# Patient Record
Sex: Female | Born: 1955 | Race: Black or African American | Hispanic: No | Smoking: Former smoker
Health system: Southern US, Community
[De-identification: ages and names within clinical notes are randomized; demographics above are authoritative.]

## PROBLEM LIST (undated history)

## (undated) DIAGNOSIS — F418 Other specified anxiety disorders: Secondary | ICD-10-CM

## (undated) DIAGNOSIS — N189 Chronic kidney disease, unspecified: Secondary | ICD-10-CM

## (undated) DIAGNOSIS — R791 Abnormal coagulation profile: Secondary | ICD-10-CM

## (undated) DIAGNOSIS — Z8719 Personal history of other diseases of the digestive system: Secondary | ICD-10-CM

## (undated) DIAGNOSIS — J189 Pneumonia, unspecified organism: Secondary | ICD-10-CM

## (undated) DIAGNOSIS — E039 Hypothyroidism, unspecified: Secondary | ICD-10-CM

## (undated) DIAGNOSIS — E119 Type 2 diabetes mellitus without complications: Secondary | ICD-10-CM

## (undated) DIAGNOSIS — I10 Essential (primary) hypertension: Secondary | ICD-10-CM

## (undated) DIAGNOSIS — N6001 Solitary cyst of right breast: Secondary | ICD-10-CM

## (undated) DIAGNOSIS — Z22322 Carrier or suspected carrier of Methicillin resistant Staphylococcus aureus: Secondary | ICD-10-CM

## (undated) DIAGNOSIS — I509 Heart failure, unspecified: Secondary | ICD-10-CM

## (undated) DIAGNOSIS — D649 Anemia, unspecified: Secondary | ICD-10-CM

## (undated) DIAGNOSIS — F4310 Post-traumatic stress disorder, unspecified: Secondary | ICD-10-CM

## (undated) DIAGNOSIS — N6002 Solitary cyst of left breast: Secondary | ICD-10-CM

## (undated) DIAGNOSIS — G4730 Sleep apnea, unspecified: Secondary | ICD-10-CM

## (undated) DIAGNOSIS — E785 Hyperlipidemia, unspecified: Secondary | ICD-10-CM

## (undated) DIAGNOSIS — E0590 Thyrotoxicosis, unspecified without thyrotoxic crisis or storm: Secondary | ICD-10-CM

## (undated) DIAGNOSIS — K219 Gastro-esophageal reflux disease without esophagitis: Secondary | ICD-10-CM

## (undated) DIAGNOSIS — H44119 Panuveitis, unspecified eye: Secondary | ICD-10-CM

## (undated) DIAGNOSIS — M1990 Unspecified osteoarthritis, unspecified site: Secondary | ICD-10-CM

## (undated) DIAGNOSIS — I4891 Unspecified atrial fibrillation: Secondary | ICD-10-CM

## (undated) HISTORY — DX: Pneumonia, unspecified organism: J18.9

## (undated) HISTORY — DX: Essential (primary) hypertension: I10

## (undated) HISTORY — DX: Type 2 diabetes mellitus without complications: E11.9

## (undated) HISTORY — DX: Abnormal coagulation profile: R79.1

## (undated) HISTORY — DX: Heart failure, unspecified: I50.9

## (undated) HISTORY — PX: EYE SURGERY: SHX253

## (undated) HISTORY — DX: Unspecified atrial fibrillation: I48.91

## (undated) HISTORY — DX: Solitary cyst of right breast: N60.01

## (undated) HISTORY — PX: KNEE SURGERY: SHX244

## (undated) HISTORY — PX: BREAST BIOPSY: SHX20

## (undated) HISTORY — DX: Chronic kidney disease, unspecified: N18.9

## (undated) HISTORY — PX: TONSILLECTOMY AND ADENOIDECTOMY: SUR1326

## (undated) HISTORY — DX: Hyperlipidemia, unspecified: E78.5

## (undated) HISTORY — DX: Thyrotoxicosis, unspecified without thyrotoxic crisis or storm: E05.90

## (undated) HISTORY — DX: Carrier or suspected carrier of methicillin resistant Staphylococcus aureus: Z22.322

## (undated) HISTORY — DX: Other specified anxiety disorders: F41.8

## (undated) HISTORY — PX: ABDOMINAL HYSTERECTOMY: SHX81

## (undated) HISTORY — PX: HERNIA REPAIR: SHX51

## (undated) HISTORY — DX: Gastro-esophageal reflux disease without esophagitis: K21.9

## (undated) HISTORY — PX: FOOT SURGERY: SHX648

## (undated) HISTORY — DX: Anemia, unspecified: D64.9

## (undated) HISTORY — PX: DENTAL SURGERY: SHX609

## (undated) HISTORY — DX: Post-traumatic stress disorder, unspecified: F43.10

## (undated) HISTORY — DX: Solitary cyst of left breast: N60.02

---

## 2011-12-26 LAB — PROTIME-INR

## 2013-01-20 ENCOUNTER — Ambulatory Visit (INDEPENDENT_AMBULATORY_CARE_PROVIDER_SITE_OTHER): Payer: Self-pay | Admitting: Internal Medicine

## 2013-01-20 ENCOUNTER — Encounter: Payer: Self-pay | Admitting: Internal Medicine

## 2013-01-20 ENCOUNTER — Encounter: Payer: Self-pay | Admitting: *Deleted

## 2013-01-20 VITALS — BP 120/78 | HR 64 | Ht 62.0 in | Wt 195.8 lb

## 2013-01-20 DIAGNOSIS — I10 Essential (primary) hypertension: Secondary | ICD-10-CM

## 2013-01-20 NOTE — Progress Notes (Signed)
HPI Patient is a 57 yo who was followed in [facility] for Afib  Hosp 1 year ago  Found to be in afib Had run out of meds for thyroid. Thyroid was not controlled at time.  Was SOB  Seen 1 yr ago. Had been on sotalol  Ran out a few wks ago.   Taking coumadin. Just regulated.  Notes occasional palpitations. Occasional chest pains with and without activity.  Stabbing  Lasts a couple seconds  At work sluggish, tired.  Out of breath  Going on for awhile.   Never had stress test    Allergies  Allergen Reactions  . Codeine     Breaks out in a rash    Current Outpatient Prescriptions  Medication Sig Dispense Refill  . ALPRAZolam (XANAX) 0.5 MG tablet Take 0.5 mg by mouth 2 (two) times daily.      . ferrous gluconate (FERGON) 325 MG tablet Take 325 mg by mouth 2 (two) times daily.      . furosemide (LASIX) 40 MG tablet Take 40 mg by mouth daily.      Marland Kitchen HYDROcodone-acetaminophen (VICODIN) 5-500 MG per tablet Take 1 tablet by mouth every 6 (six) hours as needed for pain.      Marland Kitchen lisinopril-hydrochlorothiazide (PRINZIDE,ZESTORETIC) 20-12.5 MG per tablet Take 1 tablet by mouth daily.      . meloxicam (MOBIC) 7.5 MG tablet Take 7.5 mg by mouth daily.      . metFORMIN (GLUCOPHAGE) 500 MG tablet Take 500 mg by mouth every evening.      . methimazole (TAPAZOLE) 10 MG tablet Take 20 mg by mouth 3 (three) times daily.      Marland Kitchen omeprazole (PRILOSEC) 20 MG capsule Take 20 mg by mouth daily.      . potassium chloride (KLOR-CON) 20 MEQ packet Take 20 mEq by mouth daily.       . pravastatin (PRAVACHOL) 40 MG tablet Take 40 mg by mouth daily.      . sotalol (BETAPACE) 160 MG tablet Take 160 mg by mouth 2 (two) times daily.       Marland Kitchen warfarin (COUMADIN) 2 MG tablet Take 2 mg by mouth as directed.       No current facility-administered medications for this visit.    Past Medical History  Diagnosis Date  . HTN (hypertension)   . Hyperlipemia   . INR (international normal ratio) abnormal   . Atrial  fibrillation   . DM (diabetes mellitus)   . Hyperthyroidism   . Anemia   . Esophageal reflux   . MRSA (methicillin resistant staph aureus) culture positive   . Pneumonia     Past Surgical History  Procedure Laterality Date  . Abdominal hysterectomy      Family History  Problem Relation Age of Onset  . Alcoholism Father   . Arthritis Father   . Diabetes Mellitus I Mother     sister  . Epilepsy Brother   . Hypertension Father     sister, mother  . Renal Disease Mother   . Stroke Mother     History   Social History  . Marital Status: Legally Separated    Spouse Name: N/A    Number of Children: N/A  . Years of Education: N/A   Occupational History  . Not on file.   Social History Main Topics  . Smoking status: Former Smoker    Quit date: 12/15/2003  . Smokeless tobacco: Current User    Types: Snuff  Comment: 3 times per day  . Alcohol Use: Not on file  . Drug Use: Not on file  . Sexually Active: Not on file   Other Topics Concern  . Not on file   Social History Narrative  . No narrative on file    Review of Systems:  All systems reviewed.  They are negative to the above problem except as previously stated.  Vital Signs: BP 120/78  Pulse 64  Ht 5\' 2"  (1.575 m)  Wt 195 lb 12.8 oz (88.814 kg)  BMI 35.8 kg/m2  Physical Exam Patient is in NAD HEENT:  Normocephalic, atraumatic. EOMI, PERRLA.  Neck: JVP is normal.  No bruits.  Lungs: clear to auscultation. No rales no wheezes.  Heart: Regular rate and rhythm. Normal S1, S2. No S3.   No significant murmurs. PMI not displaced.  Abdomen:  Supple, nontender. Normal bowel sounds. No masses. No hepatomegaly.  Extremities:   Good distal pulses throughout. No lower extremity edema.  Musculoskeletal :moving all extremities.  Neuro:   alert and oriented x3.  CN II-XII grossly intact.  EKG  SB 57 bpm.   Assessment and Plan:  1.  Atrial fibrillation.  Patient currently in SR.  She has been out of sotalol for  a few wks  I would not resume for now.  Will check labs.  Need to get records from [facility] re what testing she has had.  WOuld at the least get echo. Hold for now until review given that she has no insurance Continue coumadin.  2.  Fatigue/dyspnea.  Will review records.  Get thyroid.  WOuld consider echo, possible stress testing depending on outside results.    3.  HTN  Adequate control.   4.  HL  ON statin.

## 2013-01-21 ENCOUNTER — Telehealth: Payer: Self-pay | Admitting: Internal Medicine

## 2013-01-21 NOTE — Telephone Encounter (Signed)
Records rec From [facility], gave to [facility] 01/21/13/KM

## 2013-01-29 ENCOUNTER — Encounter: Payer: Self-pay | Admitting: Internal Medicine

## 2013-01-29 ENCOUNTER — Telehealth: Payer: Self-pay | Admitting: *Deleted

## 2013-01-29 NOTE — Telephone Encounter (Signed)
Per Dr. Tenny Craw:  Received records from [facility]    Patient should have f/u echo. Also recomm CBC, TSH    I would not start sotalol now.    Need to refer to free clinic.         Attempted to call pt on home and mobile number.  No answer and no voicemail.

## 2013-01-30 ENCOUNTER — Other Ambulatory Visit: Payer: Self-pay | Admitting: *Deleted

## 2013-01-30 ENCOUNTER — Encounter: Payer: Self-pay | Admitting: *Deleted

## 2013-01-30 DIAGNOSIS — R5383 Other fatigue: Secondary | ICD-10-CM

## 2013-01-30 DIAGNOSIS — R0602 Shortness of breath: Secondary | ICD-10-CM

## 2013-01-30 DIAGNOSIS — I4891 Unspecified atrial fibrillation: Secondary | ICD-10-CM

## 2013-01-30 NOTE — Telephone Encounter (Signed)
Numerous attempts have been made to contact this patient via her home and cell numbers.  Neither of her phone numbers have voicemail.  Contacted her brother via emergency contact and he said he would have her call us which she has not done.  ECHO, CBC & TSH ordered in [facility].  Message sent to [facility] to contact pt and schedule.

## 2015-03-15 ENCOUNTER — Ambulatory Visit: Payer: Self-pay | Admitting: Nurse Practitioner

## 2015-03-15 ENCOUNTER — Inpatient Hospital Stay (HOSPITAL_COMMUNITY): Admission: RE | Admit: 2015-03-15 | Payer: Self-pay | Source: Ambulatory Visit | Admitting: Nurse Practitioner

## 2015-03-29 ENCOUNTER — Encounter (HOSPITAL_COMMUNITY): Payer: Self-pay | Admitting: Nurse Practitioner

## 2015-03-29 ENCOUNTER — Ambulatory Visit (HOSPITAL_COMMUNITY)
Admission: RE | Admit: 2015-03-29 | Discharge: 2015-03-29 | Disposition: A | Discharge status: Home / Self Care | Payer: Self-pay | Source: Ambulatory Visit | Attending: Nurse Practitioner | Admitting: Nurse Practitioner

## 2015-03-29 VITALS — BP 120/86 | HR 90 | Ht 62.0 in | Wt 198.2 lb

## 2015-03-29 DIAGNOSIS — E785 Hyperlipidemia, unspecified: Secondary | ICD-10-CM | POA: Insufficient documentation

## 2015-03-29 DIAGNOSIS — I480 Paroxysmal atrial fibrillation: Secondary | ICD-10-CM | POA: Insufficient documentation

## 2015-03-29 DIAGNOSIS — Z87891 Personal history of nicotine dependence: Secondary | ICD-10-CM | POA: Insufficient documentation

## 2015-03-29 DIAGNOSIS — I10 Essential (primary) hypertension: Secondary | ICD-10-CM | POA: Insufficient documentation

## 2015-03-29 NOTE — Patient Instructions (Signed)
Follow up in 2 months. Parking code [phone]

## 2015-03-29 NOTE — Progress Notes (Signed)
Patient ID: Jennifer Foley, female   DOB: 1956-09-28, 59 y.o.   MRN: [id number]    Primary Care Physician: No primary care provider on file. Referring Physician:    Rolena Knutson is a 59 y.o. female with a h/o afib on coumadin that was referred to afib clinic. Pt was seen by Dr. Harrington Challenger in 2014, but lost to f/u. Pt recently seen by PCP who was concerned due to pt stopping her coumadin with a chadsvasc score of 4. She describes a low afib  burden with breakthrough afib for several hours every two to three months, with SR returning spontaneously. Does not feel v rates are all that fast when in afib. Was scheduled for sleep apnea test years ago but could not afford. Has just returned to seeing MD's since receiving orange card.  Today, she denies symptoms of palpitations, chest pain, shortness of breath, orthopnea, PND, lower extremity edema, dizziness, presyncope, syncope, or neurologic sequela. The patient is tolerating medications without difficulties and is otherwise without complaint today.   Past Medical History  Diagnosis Date  . HTN (hypertension)   . Hyperlipemia   . INR (international normal ratio) abnormal   . Atrial fibrillation   . DM (diabetes mellitus)   . Hyperthyroidism   . Anemia   . Esophageal reflux   . MRSA (methicillin resistant staph aureus) culture positive   . Pneumonia    Past Surgical History  Procedure Laterality Date  . Abdominal hysterectomy      Current Outpatient Prescriptions  Medication Sig Dispense Refill  . busPIRone (BUSPAR) 10 MG tablet Take 10 mg by mouth 2 (two) times daily.    . ferrous gluconate (FERGON) 325 MG tablet Take 325 mg by mouth 3 (three) times daily with meals.     . furosemide (LASIX) 40 MG tablet Take 40 mg by mouth daily.    Marland Kitchen HYDROcodone-acetaminophen (VICODIN) 5-500 MG per tablet Take 1 tablet by mouth every 6 (six) hours as needed for pain.    . hydrOXYzine (ATARAX/VISTARIL) 50 MG tablet Take 50 mg by mouth at bedtime as needed.     Marland Kitchen lisinopril-hydrochlorothiazide (PRINZIDE,ZESTORETIC) 20-12.5 MG per tablet Take 1 tablet by mouth daily.    . metFORMIN (GLUCOPHAGE) 500 MG tablet Take 500 mg by mouth every evening.    Marland Kitchen omeprazole (PRILOSEC) 20 MG capsule Take 20 mg by mouth daily.    . pravastatin (PRAVACHOL) 40 MG tablet Take 40 mg by mouth daily.    . sertraline (ZOLOFT) 50 MG tablet Take 50 mg by mouth daily.    . traMADol (ULTRAM) 50 MG tablet Take 50 mg by mouth every 6 (six) hours as needed.    . warfarin (COUMADIN) 2 MG tablet Take 2 mg by mouth as directed.     No current facility-administered medications for this encounter.    Allergies  Allergen Reactions  . Codeine     Breaks out in a rash    History   Social History  . Marital Status: Legally Separated    Spouse Name: N/A  . Number of Children: N/A  . Years of Education: N/A   Occupational History  . Not on file.   Social History Main Topics  . Smoking status: Former Smoker    Quit date: 12/15/2003  . Smokeless tobacco: Current User    Types: Snuff     Comment: 3 times per day  . Alcohol Use: Not on file  . Drug Use: Not on file  . Sexual Activity: Not  on file   Other Topics Concern  . Not on file   Social History Narrative    Family History  Problem Relation Age of Onset  . Alcoholism Father   . Arthritis Father   . Diabetes Mellitus I Mother     sister  . Epilepsy Brother   . Hypertension Father     sister, mother  . Renal Disease Mother   . Stroke Mother     ROS- All systems are reviewed and negative except as per the HPI above  Physical Exam: Filed Vitals:   03/29/15 1402  BP: 120/86  Pulse: 90  Height: 5\' 2"  (1.575 m)  Weight: 198 lb 3.2 oz (89.903 kg)    GEN- The patient is well appearing, alert and oriented x 3 today.   Head- normocephalic, atraumatic Eyes-  Sclera clear, conjunctiva pink Ears- hearing intact Oropharynx- clear Neck- supple, no JVP Lymph- no cervical lymphadenopathy Lungs- Clear to  ausculation bilaterally, normal work of breathing Heart- regular rate and rhythm, no murmurs, rubs or gallops, PMI not laterally displaced GI- soft, NT, ND, + BS Extremities- no clubbing, cyanosis, or edema MS- no significant deformity or atrophy Skin- no rash or lesion Psych- euthymic mood, full affect Neuro- strength and sensation are intact  EKG-NSR, 90 bpm, normal EKG  Assessment and Plan:  1. PAF Currently with low afib burden Discussed lifestyle, getting more exercise and weight loss Info re free weight loss class given.  2. Chadsvasc score of at least 4 Discussion re stroke risk and importance of taking blood thinners PCP is trying to get pt into coumadin clinc  3. HTN Stable  4. HLD continue statin  F/u with PCP this week as scheduled for reevaluation of INR back on coumadin Afib clinic in 2 months

## 2015-05-31 ENCOUNTER — Ambulatory Visit (HOSPITAL_COMMUNITY)
Admission: RE | Admit: 2015-05-31 | Discharge: 2015-05-31 | Disposition: A | Discharge status: Home / Self Care | Payer: Medicaid Other | Source: Ambulatory Visit | Attending: Nurse Practitioner | Admitting: Nurse Practitioner

## 2015-05-31 ENCOUNTER — Encounter (HOSPITAL_COMMUNITY): Payer: Self-pay | Admitting: Nurse Practitioner

## 2015-05-31 VITALS — BP 112/70 | HR 85 | Ht 61.0 in | Wt 198.0 lb

## 2015-05-31 DIAGNOSIS — E785 Hyperlipidemia, unspecified: Secondary | ICD-10-CM | POA: Insufficient documentation

## 2015-05-31 DIAGNOSIS — I10 Essential (primary) hypertension: Secondary | ICD-10-CM | POA: Diagnosis not present

## 2015-05-31 DIAGNOSIS — Z87891 Personal history of nicotine dependence: Secondary | ICD-10-CM | POA: Diagnosis not present

## 2015-05-31 DIAGNOSIS — I499 Cardiac arrhythmia, unspecified: Secondary | ICD-10-CM | POA: Diagnosis not present

## 2015-05-31 DIAGNOSIS — I480 Paroxysmal atrial fibrillation: Secondary | ICD-10-CM | POA: Diagnosis present

## 2015-05-31 NOTE — Progress Notes (Signed)
Patient ID: Jennifer Foley, female   DOB: August 03, 1956, 59 y.o.   MRN: [id number]    Primary Care Physician: Dr. Marcello Moores, [place] Referring Physician: Dr. Fredda Hammed Spradlin is a 59 y.o. female with a h/o afib on coumadin that was referred to afib clinic. Pt was seen by Dr. Harrington Challenger in 2014, but lost to f/u. Pt recently seen by PCP who was concerned due to pt stopping her coumadin with a chadsvasc score of 4. She describes a low afib  burden with breakthrough afib for several hours every two to three months, with SR returning spontaneously. Does not feel v rates are all that fast when in afib. Was scheduled for sleep apnea test years ago but could not afford. Has just returned to seeing MD's since receiving orange card.  On this visit to the afib clinic, she continues on warfarin, being followed by PCP and says she is in range. She is now saying that whenever she tries to do housework, go up steps, her heart rate goes crazy. Settles back down when she rests. Records reveal she was on sotalol at one time. But would like to confirm afib and afib burden before using an antiarrythmic. Currently she is not on any rate control and she may be benefit from this as a first option. She is willing to wear a holter monitor.  Today, she denies symptoms of  chest pain, shortness of breath, orthopnea, PND, lower extremity edema, dizziness, presyncope, syncope, or neurologic sequela. Positive for occasional palpitations.The patient is tolerating medications without difficulties and is otherwise without complaint today.   Past Medical History  Diagnosis Date  . HTN (hypertension)   . Hyperlipemia   . INR (international normal ratio) abnormal   . Atrial fibrillation   . DM (diabetes mellitus)   . Hyperthyroidism   . Anemia   . Esophageal reflux   . MRSA (methicillin resistant staph aureus) culture positive   . Pneumonia    Past Surgical History  Procedure Laterality Date  . Abdominal hysterectomy        Current Outpatient Prescriptions  Medication Sig Dispense Refill  . busPIRone (BUSPAR) 10 MG tablet Take 10 mg by mouth 2 (two) times daily.    . ferrous gluconate (FERGON) 325 MG tablet Take 325 mg by mouth daily.     . furosemide (LASIX) 40 MG tablet Take 40 mg by mouth daily.    Marland Kitchen HYDROcodone-acetaminophen (VICODIN) 5-500 MG per tablet Take 1 tablet by mouth every 6 (six) hours as needed for pain.    . hydrOXYzine (ATARAX/VISTARIL) 50 MG tablet Take 50 mg by mouth at bedtime as needed.    Marland Kitchen lisinopril-hydrochlorothiazide (PRINZIDE,ZESTORETIC) 20-12.5 MG per tablet Take 1 tablet by mouth daily.    . metFORMIN (GLUCOPHAGE) 500 MG tablet Take 500 mg by mouth every evening.    Marland Kitchen omeprazole (PRILOSEC) 20 MG capsule Take 20 mg by mouth daily.    . pravastatin (PRAVACHOL) 40 MG tablet Take 40 mg by mouth daily.    . sertraline (ZOLOFT) 50 MG tablet Take 50 mg by mouth daily.    . traMADol (ULTRAM) 50 MG tablet Take 50 mg by mouth every 6 (six) hours as needed.    . warfarin (COUMADIN) 2 MG tablet Take 2 mg by mouth as directed.    . hydrochlorothiazide (HYDRODIURIL) 25 MG tablet Take 25 mg by mouth daily.    . naproxen (NAPROSYN) 250 MG tablet Take 550 mg by mouth as needed.  No current facility-administered medications for this encounter.    Allergies  Allergen Reactions  . Codeine     Breaks out in a rash    History   Social History  . Marital Status: Legally Separated    Spouse Name: N/A  . Number of Children: N/A  . Years of Education: N/A   Occupational History  . Not on file.   Social History Main Topics  . Smoking status: Former Smoker    Quit date: 12/15/2003  . Smokeless tobacco: Current User    Types: Snuff     Comment: 3 times per day  . Alcohol Use: Not on file  . Drug Use: Not on file  . Sexual Activity: Not on file   Other Topics Concern  . Not on file   Social History Narrative    Family History  Problem Relation Age of Onset  . Alcoholism  Father   . Arthritis Father   . Diabetes Mellitus I Mother     sister  . Epilepsy Brother   . Hypertension Father     sister, mother  . Renal Disease Mother   . Stroke Mother     ROS- All systems are reviewed and negative except as per the HPI above  Physical Exam: Filed Vitals:   05/31/15 1443  BP: 112/70  Pulse: 85  Height: 5\' 1"  (1.549 m)  Weight: 198 lb (89.812 kg)    GEN- The patient is well appearing, alert and oriented x 3 today.   Head- normocephalic, atraumatic Eyes-  Sclera clear, conjunctiva pink Ears- hearing intact Oropharynx- clear Neck- supple, no JVP Lymph- no cervical lymphadenopathy Lungs- Clear to ausculation bilaterally, normal work of breathing Heart- regular rate and rhythm, no murmurs, rubs or gallops, PMI not laterally displaced GI- soft, NT, ND, + BS Extremities- no clubbing, cyanosis, or edema MS- no significant deformity or atrophy Skin- no rash or lesion Psych- euthymic mood, full affect Neuro- strength and sensation are intact  EKG-NSR, 85 bpm, Pr int 152 ms, QRS 86 ms, QTc  464ms, Occasional PVC  Assessment and Plan:  1. H/o afib NSR by EKG Describes irregular heart beat with exertion 48 hour monitor to further understand irregular heart beat, PC's vrs afib May need rate control vrs AAD Discussed lifestyle, getting more exercise and weight loss  2. Chadsvasc score of at least 4 Discussion re stroke risk and importance of taking blood thinners Taking coumadin, in range per pt and pcp is following  3. HTN Stable  4. HLD continue statin  afib clinic in 4 weeks

## 2015-05-31 NOTE — Patient Instructions (Signed)
Parking code for September [id number] 

## 2015-06-01 ENCOUNTER — Ambulatory Visit (INDEPENDENT_AMBULATORY_CARE_PROVIDER_SITE_OTHER): Payer: No Typology Code available for payment source

## 2015-06-01 DIAGNOSIS — I499 Cardiac arrhythmia, unspecified: Secondary | ICD-10-CM

## 2015-07-01 ENCOUNTER — Inpatient Hospital Stay (HOSPITAL_COMMUNITY): Admission: RE | Admit: 2015-07-01 | Payer: Self-pay | Source: Ambulatory Visit | Admitting: Nurse Practitioner

## 2015-07-07 ENCOUNTER — Encounter (HOSPITAL_COMMUNITY): Payer: Self-pay | Admitting: Nurse Practitioner

## 2015-07-07 ENCOUNTER — Ambulatory Visit (HOSPITAL_COMMUNITY)
Admission: RE | Admit: 2015-07-07 | Discharge: 2015-07-07 | Disposition: A | Discharge status: Home / Self Care | Payer: Medicaid Other | Source: Ambulatory Visit | Attending: Nurse Practitioner | Admitting: Nurse Practitioner

## 2015-07-07 VITALS — BP 104/70 | HR 90 | Ht 62.0 in | Wt 193.2 lb

## 2015-07-07 DIAGNOSIS — I10 Essential (primary) hypertension: Secondary | ICD-10-CM | POA: Insufficient documentation

## 2015-07-07 DIAGNOSIS — E119 Type 2 diabetes mellitus without complications: Secondary | ICD-10-CM | POA: Diagnosis not present

## 2015-07-07 DIAGNOSIS — K219 Gastro-esophageal reflux disease without esophagitis: Secondary | ICD-10-CM | POA: Insufficient documentation

## 2015-07-07 DIAGNOSIS — I4891 Unspecified atrial fibrillation: Secondary | ICD-10-CM | POA: Insufficient documentation

## 2015-07-07 DIAGNOSIS — Z87891 Personal history of nicotine dependence: Secondary | ICD-10-CM | POA: Insufficient documentation

## 2015-07-07 DIAGNOSIS — E785 Hyperlipidemia, unspecified: Secondary | ICD-10-CM | POA: Insufficient documentation

## 2015-07-07 DIAGNOSIS — Z833 Family history of diabetes mellitus: Secondary | ICD-10-CM | POA: Diagnosis not present

## 2015-07-07 DIAGNOSIS — Z7901 Long term (current) use of anticoagulants: Secondary | ICD-10-CM | POA: Diagnosis not present

## 2015-07-07 DIAGNOSIS — Z79899 Other long term (current) drug therapy: Secondary | ICD-10-CM | POA: Diagnosis not present

## 2015-07-07 DIAGNOSIS — R002 Palpitations: Secondary | ICD-10-CM

## 2015-07-07 DIAGNOSIS — Z8249 Family history of ischemic heart disease and other diseases of the circulatory system: Secondary | ICD-10-CM | POA: Insufficient documentation

## 2015-07-07 NOTE — Progress Notes (Signed)
Patient ID: Jennifer Foley, female   DOB: 11/04/1955, 59 y.o.   MRN: [id number]    Primary Care Physician: Dr. Marcello Moores, [place] Referring Physician: Dr. Fredda Hammed Jennifer Foley is a 59 y.o. female with a h/o afib on coumadin that was referred to afib clinic. Pt was seen by Dr. Harrington Challenger in 2014, but lost to f/u. Pt recently seen by PCP who was concerned due to pt stopping her coumadin with a chadsvasc score of 4. She describes a low afib  burden with breakthrough afib for several hours every two to three months, with SR returning spontaneously. Does not feel v rates are all that fast when in afib. Was scheduled for sleep apnea test years ago but could not afford. Has just returned to seeing MD's since receiving orange card.  On this visit to the afib clinic, she continues on warfarin, being followed by PCP and says she is in range. She is now saying that whenever she tries to do housework, go up steps, her heart rate increases.. Settles back down when she rests. Records reveal she was on sotalol at one time.She wore a holter monitor which did not show any afib.She reports no episodes that sound like PAF. Some increased heart rate with activity, possibly secondary to deconditioning.  Today, she denies symptoms of  chest pain, shortness of breath, orthopnea, PND, lower extremity edema, dizziness, presyncope, syncope, or neurologic sequela. Positive for occasional palpitations.The patient is tolerating medications without difficulties and is otherwise without complaint today.   Past Medical History  Diagnosis Date  . HTN (hypertension)   . Hyperlipemia   . INR (international normal ratio) abnormal   . Atrial fibrillation   . DM (diabetes mellitus)   . Hyperthyroidism   . Anemia   . Esophageal reflux   . MRSA (methicillin resistant staph aureus) culture positive   . Pneumonia    Past Surgical History  Procedure Laterality Date  . Abdominal hysterectomy      Current Outpatient  Prescriptions  Medication Sig Dispense Refill  . busPIRone (BUSPAR) 10 MG tablet Take 10 mg by mouth 2 (two) times daily.    . ferrous gluconate (FERGON) 325 MG tablet Take 325 mg by mouth daily.     . furosemide (LASIX) 40 MG tablet Take 40 mg by mouth daily.    Marland Kitchen HYDROcodone-acetaminophen (VICODIN) 5-500 MG per tablet Take 1 tablet by mouth every 6 (six) hours as needed for pain.    . hydrOXYzine (ATARAX/VISTARIL) 50 MG tablet Take 50 mg by mouth at bedtime as needed.    Marland Kitchen lisinopril-hydrochlorothiazide (PRINZIDE,ZESTORETIC) 20-12.5 MG per tablet Take 1 tablet by mouth daily.    . metFORMIN (GLUCOPHAGE) 500 MG tablet Take 500 mg by mouth every evening.    . naproxen (NAPROSYN) 250 MG tablet Take 550 mg by mouth as needed.    Marland Kitchen omeprazole (PRILOSEC) 20 MG capsule Take 20 mg by mouth daily.    . sertraline (ZOLOFT) 50 MG tablet Take 50 mg by mouth daily.    . traMADol (ULTRAM) 50 MG tablet Take 50 mg by mouth every 6 (six) hours as needed.    . warfarin (COUMADIN) 2 MG tablet Take 2 mg by mouth as directed.    . hydrochlorothiazide (HYDRODIURIL) 25 MG tablet Take 25 mg by mouth daily.    . pravastatin (PRAVACHOL) 40 MG tablet Take 40 mg by mouth daily.     No current facility-administered medications for this encounter.    Allergies  Allergen Reactions  .  Codeine     Breaks out in a rash    Social History   Social History  . Marital Status: Legally Separated    Spouse Name: N/A  . Number of Children: N/A  . Years of Education: N/A   Occupational History  . Not on file.   Social History Main Topics  . Smoking status: Former Smoker    Quit date: 12/15/2003  . Smokeless tobacco: Current User    Types: Snuff     Comment: 3 times per day  . Alcohol Use: Not on file  . Drug Use: Not on file  . Sexual Activity: Not on file   Other Topics Concern  . Not on file   Social History Narrative    Family History  Problem Relation Age of Onset  . Alcoholism Father   .  Arthritis Father   . Diabetes Mellitus I Mother     sister  . Epilepsy Brother   . Hypertension Father     sister, mother  . Renal Disease Mother   . Stroke Mother     ROS- All systems are reviewed and negative except as per the HPI above  Physical Exam: Filed Vitals:   07/07/15 1553  BP: 104/70  Pulse: 90  Height: 5\' 2"  (1.575 m)  Weight: 193 lb 3.2 oz (87.635 kg)    GEN- The patient is well appearing, alert and oriented x 3 today.   Head- normocephalic, atraumatic Eyes-  Sclera clear, conjunctiva pink Ears- hearing intact Oropharynx- clear Neck- supple, no JVP Lymph- no cervical lymphadenopathy Lungs- Clear to ausculation bilaterally, normal work of breathing Heart- regular rate and rhythm, no murmurs, rubs or gallops, PMI not laterally displaced GI- soft, NT, ND, + BS Extremities- no clubbing, cyanosis, or edema MS- no significant deformity or atrophy Skin- no rash or lesion Psych- euthymic mood, full affect Neuro- strength and sensation are intact  EKG-NSR, 85 bpm, Pr int 152 ms, QRS 86 ms, QTc  458ms, Occasional PVC Holter monitor- 06/01/15 Sinus rhythm Rates 65 to 129 bpm Average HR 88 bpm Occasional PVC Rare PAC   Assessment and Plan:  1. H/o afib NSR by recent  48 hour moniotr No complaints of recent irregular heart beat. Discussed lifestyle, getting more exercise and weight loss  2. Chadsvasc score of at least 4 Discussion re stroke risk and importance of taking blood thinners Taking coumadin, in range per pt and pcp is following  3. HTN Stable  4. HLD continue statin  afib clinic in 3 months  Butch Penny C. Verlie Liotta, [facility] [address] [place],  [address] [phone]

## 2015-10-06 ENCOUNTER — Encounter (HOSPITAL_COMMUNITY): Payer: Self-pay | Admitting: Nurse Practitioner

## 2015-10-06 ENCOUNTER — Ambulatory Visit (HOSPITAL_COMMUNITY)
Admission: RE | Admit: 2015-10-06 | Discharge: 2015-10-06 | Disposition: A | Discharge status: Home / Self Care | Payer: Medicaid Other | Source: Ambulatory Visit | Attending: Nurse Practitioner | Admitting: Nurse Practitioner

## 2015-10-06 VITALS — BP 94/64 | HR 97 | Ht 61.0 in | Wt 201.6 lb

## 2015-10-06 DIAGNOSIS — E785 Hyperlipidemia, unspecified: Secondary | ICD-10-CM | POA: Diagnosis not present

## 2015-10-06 DIAGNOSIS — I10 Essential (primary) hypertension: Secondary | ICD-10-CM | POA: Insufficient documentation

## 2015-10-06 DIAGNOSIS — I480 Paroxysmal atrial fibrillation: Secondary | ICD-10-CM | POA: Diagnosis not present

## 2015-10-06 NOTE — Progress Notes (Signed)
Patient ID: Jennifer Foley, female   DOB: [birth year], 59 y.o.   MRN: [id number]    Primary Care Physician: Dr. Marcello Moores, [place] Referring Physician: Dr. [name] is a 59 y.o. female with a h/o afib on coumadin that was referred to afib clinic. Pt was seen by Dr. Harrington Challenger in 2014, but lost to f/u. Pt recently seen by PCP who was concerned due to pt stopping her coumadin with a chadsvasc score of 4. She describes a low afib  burden with breakthrough afib for several hours every two to three months, with SR returning spontaneously. Does not feel v rates are all that fast when in afib. Was scheduled for sleep apnea test years ago but could not afford. Has just returned to seeing MD's since receiving orange card.  On this visit to the afib clinic, she continues on warfarin, being followed by PCP and says she is in range. She is now saying that whenever she tries to do housework, go up steps, her heart rate increases.. Settles back down when she rests. Records reveal she was on sotalol at one time.She wore a holter monitor which did not show any afib.She reports no episodes that sound like PAF.   Today, she denies symptoms of  chest pain, shortness of breath, orthopnea, PND, lower extremity edema, dizziness, presyncope, syncope, or neurologic sequela. Positive for occasional palpitations.The patient is tolerating medications without difficulties and is otherwise without complaint today.   Past Medical History  Diagnosis Date  . HTN (hypertension)   . Hyperlipemia   . INR (international normal ratio) abnormal   . Atrial fibrillation ([place])   . DM (diabetes mellitus) ([place])   . Hyperthyroidism   . Anemia   . Esophageal reflux   . MRSA (methicillin resistant staph aureus) culture positive   . Pneumonia    Past Surgical History  Procedure Laterality Date  . Abdominal hysterectomy      Current Outpatient Prescriptions  Medication Sig Dispense Refill  . busPIRone (BUSPAR) 10 MG  tablet Take 10 mg by mouth 2 (two) times daily.    . ferrous gluconate (FERGON) 325 MG tablet Take 325 mg by mouth daily.     . furosemide (LASIX) 40 MG tablet Take 40 mg by mouth daily.    . hydrochlorothiazide (HYDRODIURIL) 25 MG tablet Take 25 mg by mouth daily.    Marland Kitchen HYDROcodone-acetaminophen (VICODIN) 5-500 MG per tablet Take 1 tablet by mouth every 6 (six) hours as needed for pain.    . hydrOXYzine (ATARAX/VISTARIL) 50 MG tablet Take 50 mg by mouth at bedtime as needed.    Marland Kitchen lisinopril-hydrochlorothiazide (PRINZIDE,ZESTORETIC) 20-12.5 MG per tablet Take 1 tablet by mouth daily.    . metFORMIN (GLUCOPHAGE) 500 MG tablet Take 500 mg by mouth every evening.    . naproxen (NAPROSYN) 250 MG tablet Take 550 mg by mouth as needed.    Marland Kitchen omeprazole (PRILOSEC) 20 MG capsule Take 20 mg by mouth daily.    . pravastatin (PRAVACHOL) 40 MG tablet Take 40 mg by mouth daily.    . sertraline (ZOLOFT) 50 MG tablet Take 50 mg by mouth daily.    . traMADol (ULTRAM) 50 MG tablet Take 50 mg by mouth every 6 (six) hours as needed.    . warfarin (COUMADIN) 2 MG tablet Take 5 mg by mouth as directed.      No current facility-administered medications for this encounter.    Allergies  Allergen Reactions  . Codeine  Breaks out in a rash    Social History   Social History  . Marital Status: Legally Separated    Spouse Name: N/A  . Number of Children: N/A  . Years of Education: N/A   Occupational History  . Not on file.   Social History Main Topics  . Smoking status: Former Smoker    Quit date: 12/15/2003  . Smokeless tobacco: Current User    Types: Snuff     Comment: 3 times per day  . Alcohol Use: Not on file  . Drug Use: Not on file  . Sexual Activity: Not on file   Other Topics Concern  . Not on file   Social History Narrative    Family History  Problem Relation Age of Onset  . Alcoholism Father   . Arthritis Father   . Diabetes Mellitus I Mother     sister  . Epilepsy Brother     . Hypertension Father     sister, mother  . Renal Disease Mother   . Stroke Mother     ROS- All systems are reviewed and negative except as per the HPI above  Physical Exam: Filed Vitals:   10/06/15 1544  BP: 94/64  Pulse: 97  Height: 5\' 1"  (1.549 m)  Weight: 201 lb 9.6 oz (91.445 kg)    GEN- The patient is well appearing, alert and oriented x 3 today.   Head- normocephalic, atraumatic Eyes-  Sclera clear, conjunctiva pink Ears- hearing intact Oropharynx- clear Neck- supple, no JVP Lymph- no cervical lymphadenopathy Lungs- Clear to ausculation bilaterally, normal work of breathing Heart- regular rate and rhythm, no murmurs, rubs or gallops, PMI not laterally displaced GI- soft, NT, ND, + BS Extremities- no clubbing, cyanosis, or edema MS- no significant deformity or atrophy Skin- no rash or lesion Psych- euthymic mood, full affect Neuro- strength and sensation are intact  EKG-NSR, 97 bpm, Pr int 154 ms, QRS 84 ms, QTc  433ms,  Holter monitor- 06/01/15 Sinus rhythm Rates 65 to 129 bpm Average HR 88 bpm Occasional PVC Rare PAC   Assessment and Plan:  1. H/o afib NSR by recent  48 hour monitor No complaints of  irregular heart beat. Discussed lifestyle, getting more exercise and weight loss  2. Chadsvasc score of at least 4 Discussion re stroke risk and importance of taking blood thinners Taking coumadin, in range per pt and pcp is following  3. HTN BP low today, asymptomatic Encouraged to drink a couple glasses of extra water next couple of days Usually drinks just soda's Goes to coumadin clinic next week, she is ask to have her BP checked and if low, may need antihypertensive's decreased.   4. HLD Continue pravastain  F/u afib clinc as needed  [name], [facility] [address] [place], [place] [id number] [phone]

## 2015-12-23 ENCOUNTER — Ambulatory Visit: Payer: Medicare Other | Attending: Primary Care | Admitting: Physical Therapy

## 2015-12-23 ENCOUNTER — Emergency Department (HOSPITAL_COMMUNITY)
Admission: EM | Admit: 2015-12-23 | Discharge: 2015-12-24 | Disposition: A | Discharge status: Home / Self Care | Payer: Medicare Other | Attending: Emergency Medicine | Admitting: Emergency Medicine

## 2015-12-23 ENCOUNTER — Encounter: Payer: Self-pay | Admitting: Physical Therapy

## 2015-12-23 DIAGNOSIS — M545 Low back pain: Secondary | ICD-10-CM | POA: Diagnosis not present

## 2015-12-23 DIAGNOSIS — M25562 Pain in left knee: Secondary | ICD-10-CM | POA: Diagnosis present

## 2015-12-23 DIAGNOSIS — Z7984 Long term (current) use of oral hypoglycemic drugs: Secondary | ICD-10-CM | POA: Diagnosis not present

## 2015-12-23 DIAGNOSIS — Z8614 Personal history of Methicillin resistant Staphylococcus aureus infection: Secondary | ICD-10-CM | POA: Diagnosis not present

## 2015-12-23 DIAGNOSIS — E785 Hyperlipidemia, unspecified: Secondary | ICD-10-CM | POA: Insufficient documentation

## 2015-12-23 DIAGNOSIS — D649 Anemia, unspecified: Secondary | ICD-10-CM | POA: Diagnosis not present

## 2015-12-23 DIAGNOSIS — R109 Unspecified abdominal pain: Secondary | ICD-10-CM | POA: Diagnosis not present

## 2015-12-23 DIAGNOSIS — R269 Unspecified abnormalities of gait and mobility: Secondary | ICD-10-CM | POA: Diagnosis present

## 2015-12-23 DIAGNOSIS — R10A0 Flank pain, unspecified side: Secondary | ICD-10-CM

## 2015-12-23 DIAGNOSIS — R51 Headache: Secondary | ICD-10-CM | POA: Insufficient documentation

## 2015-12-23 DIAGNOSIS — E119 Type 2 diabetes mellitus without complications: Secondary | ICD-10-CM | POA: Diagnosis not present

## 2015-12-23 DIAGNOSIS — N289 Disorder of kidney and ureter, unspecified: Secondary | ICD-10-CM

## 2015-12-23 DIAGNOSIS — I4891 Unspecified atrial fibrillation: Secondary | ICD-10-CM | POA: Insufficient documentation

## 2015-12-23 DIAGNOSIS — R079 Chest pain, unspecified: Secondary | ICD-10-CM | POA: Diagnosis not present

## 2015-12-23 DIAGNOSIS — Z7901 Long term (current) use of anticoagulants: Secondary | ICD-10-CM | POA: Insufficient documentation

## 2015-12-23 DIAGNOSIS — M25551 Pain in right hip: Secondary | ICD-10-CM | POA: Insufficient documentation

## 2015-12-23 DIAGNOSIS — Z90710 Acquired absence of both cervix and uterus: Secondary | ICD-10-CM | POA: Diagnosis not present

## 2015-12-23 DIAGNOSIS — Z8701 Personal history of pneumonia (recurrent): Secondary | ICD-10-CM | POA: Insufficient documentation

## 2015-12-23 DIAGNOSIS — Z87891 Personal history of nicotine dependence: Secondary | ICD-10-CM | POA: Insufficient documentation

## 2015-12-23 DIAGNOSIS — E041 Nontoxic single thyroid nodule: Secondary | ICD-10-CM | POA: Diagnosis not present

## 2015-12-23 DIAGNOSIS — R61 Generalized hyperhidrosis: Secondary | ICD-10-CM | POA: Insufficient documentation

## 2015-12-23 DIAGNOSIS — I10 Essential (primary) hypertension: Secondary | ICD-10-CM | POA: Diagnosis not present

## 2015-12-23 DIAGNOSIS — Z79899 Other long term (current) drug therapy: Secondary | ICD-10-CM | POA: Diagnosis not present

## 2015-12-23 DIAGNOSIS — K219 Gastro-esophageal reflux disease without esophagitis: Secondary | ICD-10-CM | POA: Diagnosis not present

## 2015-12-23 NOTE — Patient Instructions (Addendum)
Gait training with single point cane - instructed in correct sequence; also instructed in correct sequence with use of cane with step negotiation  Informed pt of least inexpensive  Single point canes to be purchased ([employer]) with small rubber quad tip ($8) - pt verbalized understanding

## 2015-12-24 ENCOUNTER — Emergency Department (HOSPITAL_COMMUNITY): Payer: Medicare Other

## 2015-12-24 ENCOUNTER — Encounter (HOSPITAL_COMMUNITY): Payer: Self-pay | Admitting: Emergency Medicine

## 2015-12-24 DIAGNOSIS — R079 Chest pain, unspecified: Secondary | ICD-10-CM | POA: Diagnosis not present

## 2015-12-24 LAB — HEPATIC FUNCTION PANEL
ALT: 9 U/L — ABNORMAL LOW (ref 14–54)
AST: 15 U/L (ref 15–41)
Albumin: 3.6 g/dL (ref 3.5–5.0)
Alkaline Phosphatase: 78 U/L (ref 38–126)
TOTAL PROTEIN: 7.3 g/dL (ref 6.5–8.1)
Total Bilirubin: 0.3 mg/dL (ref 0.3–1.2)

## 2015-12-24 LAB — BASIC METABOLIC PANEL
ANION GAP: 16 — AB (ref 5–15)
BUN: 29 mg/dL — ABNORMAL HIGH (ref 6–20)
CALCIUM: 9.7 mg/dL (ref 8.9–10.3)
CHLORIDE: 97 mmol/L — AB (ref 101–111)
CO2: 27 mmol/L (ref 22–32)
Creatinine, Ser: 1.74 mg/dL — ABNORMAL HIGH (ref 0.44–1.00)
GFR calc non Af Amer: 31 mL/min — ABNORMAL LOW (ref >60)
GFR, EST AFRICAN AMERICAN: 36 mL/min — AB (ref >60)
Glucose, Bld: 144 mg/dL — ABNORMAL HIGH (ref 65–99)
Potassium: 3.4 mmol/L — ABNORMAL LOW (ref 3.5–5.1)
Sodium: 140 mmol/L (ref 135–145)

## 2015-12-24 LAB — CBC WITH DIFFERENTIAL/PLATELET
BASOS PCT: 1 %
Basophils Absolute: 0.1 10*3/uL (ref 0.0–0.1)
Eosinophils Absolute: 0.1 10*3/uL (ref 0.0–0.7)
Eosinophils Relative: 2 %
HCT: 35.6 % — ABNORMAL LOW (ref 36.0–46.0)
HEMOGLOBIN: 11.5 g/dL — AB (ref 12.0–15.0)
Lymphocytes Relative: 20 %
Lymphs Abs: 1.8 10*3/uL (ref 0.7–4.0)
MCH: 28.5 pg (ref 26.0–34.0)
MCHC: 32.3 g/dL (ref 30.0–36.0)
MCV: 88.1 fL (ref 78.0–100.0)
Monocytes Absolute: 0.4 10*3/uL (ref 0.1–1.0)
Monocytes Relative: 4 %
NEUTROS ABS: 6.9 10*3/uL (ref 1.7–7.7)
NEUTROS PCT: 73 %
PLATELETS: 232 10*3/uL (ref 150–400)
RBC: 4.04 MIL/uL (ref 3.87–5.11)
RDW: 13.3 % (ref 11.5–15.5)
WBC: 9.3 10*3/uL (ref 4.0–10.5)

## 2015-12-24 LAB — URINALYSIS, ROUTINE W REFLEX MICROSCOPIC
BILIRUBIN URINE: NEGATIVE
Glucose, UA: NEGATIVE mg/dL
Hgb urine dipstick: NEGATIVE
KETONES UR: NEGATIVE mg/dL
LEUKOCYTES UA: NEGATIVE
NITRITE: NEGATIVE
PH: 5.5 (ref 5.0–8.0)
PROTEIN: NEGATIVE mg/dL
Specific Gravity, Urine: 1.016 (ref 1.005–1.030)

## 2015-12-24 LAB — I-STAT TROPONIN, ED: TROPONIN I, POC: 0 ng/mL (ref 0.00–0.08)

## 2015-12-24 LAB — CBG MONITORING, ED: Glucose-Capillary: 129 mg/dL — ABNORMAL HIGH (ref 65–99)

## 2015-12-24 LAB — LIPASE, BLOOD: LIPASE: 24 U/L (ref 11–51)

## 2015-12-24 MED ORDER — OXYCODONE-ACETAMINOPHEN 5-325 MG PO TABS
1.0000 | ORAL_TABLET | Freq: Once | ORAL | Status: AC
  Administered 2015-12-24: 1 via ORAL
  Filled 2015-12-24: qty 1

## 2015-12-24 MED ORDER — SODIUM CHLORIDE 0.9 % IV BOLUS (SEPSIS)
1000.0000 mL | Freq: Once | INTRAVENOUS | Status: AC
  Administered 2015-12-24: 1000 mL via INTRAVENOUS

## 2015-12-24 MED ORDER — FENTANYL CITRATE (PF) 100 MCG/2ML IJ SOLN
100.0000 ug | Freq: Once | INTRAMUSCULAR | Status: AC
  Administered 2015-12-24: 100 ug via INTRAVENOUS
  Filled 2015-12-24: qty 2

## 2015-12-24 NOTE — ED Notes (Signed)
Pt reports that she went out to eat and about 30 minutes later she began having left sided chest pain that radiated to her back b/t her shoulder blades.  She reports that she did take her reflux medications.  First responders reported she was diaphoric upon arrival but that has since stopped.  She reports several recent medication changes.

## 2015-12-24 NOTE — ED Provider Notes (Signed)
CSN: [id number]     Arrival date & time 12/23/15  2353 History   By signing my name below, I, Jennifer Foley, attest that this documentation has been prepared under the direction and in the presence of [name], MD.   Electronically Signed: Nicole Foley, ED Scribe. 12/24/2015. 1:58 AM     Chief Complaint  Patient presents with  . Chest Pain    Patient is a 60 y.o. female presenting with abdominal pain. The history is provided by the patient. No language interpreter was used.  Abdominal Pain Pain location:  L flank Pain radiates to:  Back, chest and R leg Pain severity:  Mild Onset quality:  Sudden Duration:  3 hours Timing:  Constant Progression:  Unable to specify Chronicity:  New Relieved by:  Nothing Worsened by:  Nothing tried Ineffective treatments:  None tried Associated symptoms: chest pain   Associated symptoms: no dysuria, no fever and no vomiting    HPI Comments: Jennifer Foley is a 60 y.o. female who presents to the Emergency Department complaining of sudden onset, constant, left-sided abdominal pain, onset earlier tonight approximately three hours ago. Pt reports associated diaphoresis, upper and lower back pain, right hip pain, mild chest pain, and headache. She says that the pain began in her abdomen before alleviating and traveling to the other areas noted. No worsening or alleviating factors noted. Pt denies fever, vomiting, dysuria, difficulty urinating, weakness in extremities, or any other pertinent symptoms.    Past Medical History  Diagnosis Date  . HTN (hypertension)   . Hyperlipemia   . INR (international normal ratio) abnormal   . Atrial fibrillation ([place])   . DM (diabetes mellitus) ([place])   . Hyperthyroidism   . Anemia   . Esophageal reflux   . MRSA (methicillin resistant staph aureus) culture positive   . Pneumonia    Past Surgical History  Procedure Laterality Date  . Abdominal hysterectomy    . Hernia repair     Family History   Problem Relation Age of Onset  . Alcoholism Father   . Arthritis Father   . Diabetes Mellitus I Mother     sister  . Epilepsy Brother   . Hypertension Father     sister, mother  . Renal Disease Mother   . Stroke Mother    Social History  Substance Use Topics  . Smoking status: Former Smoker    Quit date: 12/15/2003  . Smokeless tobacco: Current User    Types: Snuff     Comment: 3 times per day  . Alcohol Use: None   OB History    No data available     Review of Systems  Constitutional: Negative for fever.  Cardiovascular: Positive for chest pain.       Mild, non-radiating chest pain.   Gastrointestinal: Positive for abdominal pain. Negative for vomiting.  Genitourinary: Negative for dysuria and difficulty urinating.  Musculoskeletal: Positive for back pain and arthralgias.       Right hip pain.  Neurological: Positive for headaches. Negative for weakness.  All other systems reviewed and are negative.   Allergies  Codeine  Home Medications   Prior to Admission medications   Medication Sig Start Date End Date Taking? Authorizing Provider  busPIRone (BUSPAR) 10 MG tablet Take 10 mg by mouth 2 (two) times daily.    Historical Provider, MD  ferrous gluconate (FERGON) 325 MG tablet Take 325 mg by mouth daily.     Historical Provider, MD  furosemide (LASIX) 40 MG  tablet Take 40 mg by mouth daily.    Historical Provider, MD  hydrochlorothiazide (HYDRODIURIL) 25 MG tablet Take 25 mg by mouth daily.    Historical Provider, MD  HYDROcodone-acetaminophen (VICODIN) 5-500 MG per tablet Take 1 tablet by mouth every 6 (six) hours as needed for pain.    Historical Provider, MD  hydrOXYzine (ATARAX/VISTARIL) 50 MG tablet Take 50 mg by mouth at bedtime as needed.    Historical Provider, MD  lisinopril-hydrochlorothiazide (PRINZIDE,ZESTORETIC) 20-12.5 MG per tablet Take 1 tablet by mouth daily.    Historical Provider, MD  metFORMIN (GLUCOPHAGE) 500 MG tablet Take 500 mg by mouth every  evening.    Historical Provider, MD  naproxen (NAPROSYN) 250 MG tablet Take 550 mg by mouth as needed.    Historical Provider, MD  omeprazole (PRILOSEC) 20 MG capsule Take 20 mg by mouth daily.    Historical Provider, MD  pravastatin (PRAVACHOL) 40 MG tablet Take 40 mg by mouth daily.    Historical Provider, MD  Rivaroxaban (XARELTO) 15 MG TABS tablet Take 15 mg by mouth 1 day or 1 dose.    Historical Provider, MD  sertraline (ZOLOFT) 50 MG tablet Take 50 mg by mouth daily.    Historical Provider, MD  traMADol (ULTRAM) 50 MG tablet Take 50 mg by mouth every 6 (six) hours as needed.    Historical Provider, MD  warfarin (COUMADIN) 2 MG tablet Take 5 mg by mouth as directed.     Historical Provider, MD   BP 119/72 mmHg  Pulse 78  Temp(Src) 97.8 F (36.6 C) (Oral)  Resp 19  SpO2 100% Physical Exam CONSTITUTIONAL: Well developed/well nourished HEAD: Normocephalic/atraumatic EYES: EOMI/PERRL ENMT: Mucous membranes moist NECK: supple no meningeal signs SPINE/BACK:entire spine nontender CV: S1/S2 noted, no murmurs/rubs/gallops noted LUNGS: Lungs are clear to auscultation bilaterally, no apparent distress ABDOMEN: soft, nontender, no rebound or guarding, bowel sounds noted throughout abdomen [id number] CVA tenderness.  NEURO: Pt is awake/alert/appropriate, moves all extremitiesx4.  No facial droop.   EXTREMITIES:  full ROM, feet are warm touch, no deformities, no discoloration SKIN: warm, color normal PSYCH: no abnormalities of mood noted, alert and oriented to situation  ED Course  Procedures  Medications  oxyCODONE-acetaminophen (PERCOCET/ROXICET) 5-325 MG per tablet 1 tablet (not administered)  fentaNYL (SUBLIMAZE) injection 100 mcg (100 mcg Intravenous Given 12/24/15 0239)  sodium chloride 0.9 % bolus 1,000 mL (0 mLs Intravenous Stopped 12/24/15 0522)    DIAGNOSTIC STUDIES: Oxygen Saturation is 100% on RA, normal by my interpretation.    COORDINATION OF CARE: 1:53 AM-Discussed  treatment plan which includes urinalysis, blood lipase, CXR, EKG, BMP, and CBC with differential with pt at bedside and pt agreed to plan.   3:41 AM Pt feeling improved, but still has LLQ pain and left flank pain No active CP I doubt ACS/PE/Dissection However due to persistent flank pain, will perform CT imaging  6:38 AM Ct SCAN NEGATIVE PT IMPROVED SHE NEVER HAD ANY RETURN OF CP IN THE ED AND I DOUBT PE/ACS/DISSECTION AS SHE SAID SHE MOSTLY HAD PAIN IN LEFT FLANK/ABDOMEN I ORDERED CT RENAL STUDY DUE TO FLANK PAIN, BUT WHILE IN CT SCANNER SHE TOLD TECH THAT SHE HAD CP EARLIER IN THE NIGHT AND THEREFORE A CT CHEST WAS ALSO PERFORMED, ADVISED RADIOLOGY NOT TO CHARGE PATIENT FOR CT CHEST  ADVISED TO HOLD LASIX FOR 3 DAYS DUE TO RENAL INSUFFICIENCY AND NEEDS TO SEE PCP FOR LAB CHECK NEXT WEEK WILL ALSO NEED THYROID US DUE TO NODULE, AND SHE  IS AWARE OF THIS CREATININE >30, SO SHE CAN CONTINUE HER XARELTO  Labs Review Labs Reviewed  BASIC METABOLIC PANEL - Abnormal; Notable for the following:    Potassium 3.4 (*)    Chloride 97 (*)    Glucose, Bld 144 (*)    BUN 29 (*)    Creatinine, Ser 1.74 (*)    GFR calc non Af Amer 31 (*)    GFR calc Af Amer 36 (*)    Anion gap 16 (*)    All other components within normal limits  CBC WITH DIFFERENTIAL/PLATELET - Abnormal; Notable for the following:    Hemoglobin 11.5 (*)    HCT 35.6 (*)    All other components within normal limits  HEPATIC FUNCTION PANEL - Abnormal; Notable for the following:    ALT 9 (*)    Bilirubin, Direct <0.1 (*)    All other components within normal limits  URINALYSIS, ROUTINE W REFLEX MICROSCOPIC (NOT AT [facility]) - Abnormal; Notable for the following:    APPearance CLOUDY (*)    All other components within normal limits  CBG MONITORING, ED - Abnormal; Notable for the following:    Glucose-Capillary 129 (*)    All other components within normal limits  LIPASE, BLOOD  I-STAT TROPOININ, ED    Imaging Review Dg Chest  2 View  12/24/2015  CLINICAL DATA:  60 year old female with chest pain EXAM: CHEST  2 VIEW COMPARISON:  None. FINDINGS: The heart size and mediastinal contours are within normal limits. Both lungs are clear. The visualized skeletal structures are unremarkable. IMPRESSION: No active cardiopulmonary disease. Electronically Signed   By: Anner Crete M.D.   On: 12/24/2015 01:47   I have personally reviewed and evaluated these images and lab results as part of my medical decision-making.   EKG Interpretation   Date/Time:  Friday December 24 2015 00:02:48 EST Ventricular Rate:  79 PR Interval:  164 QRS Duration: 95 QT Interval:  375 QTC Calculation: 430 R Axis:   74 Text Interpretation:  Sinus rhythm Borderline T wave abnormalities No  significant change since last tracing Confirmed by Christy Gentles  MD, Tighe Gitto  [id number]) on 12/24/2015 12:06:00 AM      MDM   Final diagnoses:  Flank pain  Chest pain, unspecified chest pain type  Renal insufficiency  Thyroid nodule   Nursing notes including past medical history and social history reviewed and considered in documentation xrays/imaging reviewed by myself and considered during evaluation Labs/vital reviewed myself and considered during evaluation   I personally performed the services described in this documentation, which was scribed in my presence. The recorded information has been reviewed and is accurate.       [name], MD 12/24/15 [phone]

## 2015-12-24 NOTE — Discharge Instructions (Signed)
°  SEEK IMMEDIATE MEDICAL ATTENTION IF: The pain does not go away or becomes severe, particularly over the next 8-12 hours.  A temperature above 100.86F develops.  Repeated vomiting occurs (multiple episodes).  The pain becomes localized to portions of the abdomen. The right side could possibly be appendicitis. In an adult, the left lower portion of the abdomen could be colitis or diverticulitis.  Blood is being passed in stools or vomit (bright red or black tarry stools).  Return also if you develop chest pain, difficulty breathing, dizziness or fainting, or become confused, poorly responsive, or inconsolable.   PLEASE HOLD YOUR LASIX FOR NEXT 3 DAYS SEE YOUR PRIMARY DOCTOR ON Monday FOR RECHECK OF YOUR KIDNEY FUNCTION ALSO - BE SURE TO HAVE ULTRASOUND OF YOUR THYROID SOON AND SPEAK TO YOUR PRIMARY DOCTOR ABOUT THIS

## 2015-12-24 NOTE — ED Notes (Signed)
CBG 129. RN notified.

## 2015-12-24 NOTE — ED Notes (Signed)
Patient transported to X-ray 

## 2015-12-24 NOTE — Therapy (Signed)
[facility] [address] [place], [place], [address] Phone: [phone]   Fax:  [phone]  Physical Therapy Evaluation  Patient Details  Name: Jennifer Foley MRN: [id number] Date of Birth: 11/14/1955 Referring Provider: Juluis Mire, NP  Encounter Date: 12/23/2015      PT End of Session - 12/24/15 1507    Visit Number 1   Number of Visits 1   PT Start Time 0932   PT Stop Time 1016   PT Time Calculation (min) 44 min      Past Medical History  Diagnosis Date  . HTN (hypertension)   . Hyperlipemia   . INR (international normal ratio) abnormal   . Atrial fibrillation ([place])   . DM (diabetes mellitus) ([place])   . Hyperthyroidism   . Anemia   . Esophageal reflux   . MRSA (methicillin resistant staph aureus) culture positive   . Pneumonia     Past Surgical History  Procedure Laterality Date  . Abdominal hysterectomy    . Hernia repair      There were no vitals filed for this visit.  Visit Diagnosis:  Gait difficulty - Plan: PT plan of care cert/re-cert  Left knee pain - Plan: PT plan of care cert/re-cert      Subjective Assessment - 12/23/15 1234    Subjective Pt reports L knee was initially injured in Sept. 2008 (Worker's Comp injury) and later required arthroscopic surgery.  Pt states she has much difficulty going up and down steps and is currently living in an apartment on the 2nd floor.  Pt is requesting a letter stating she needs an apartment on the ground level  to avoid step negotiation to increase safety and decrease pain in L knee.  She states that her doctor told her to go to PT  and that the therapist needed to be the person to write this LMN.   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 Yes   Pain Score 8    Pain Location Knee   Pain Orientation Left   Pain Descriptors / Indicators Aching;Constant;Sore;Throbbing   Pain Type Chronic pain   Pain Onset More than a month ago   Pain Frequency Constant   Aggravating Factors  going up/down steps   Pain Relieving Factors pain medicine helps            [facility] [place] PT Assessment - 12/24/15 0001    Assessment   Medical Diagnosis L leg pain   Referring Provider Juluis Mire, NP   Onset Date/Surgical Date 06/28/07   Prior Therapy --  in 2009   Precautions   Precautions Fall   Balance Screen   Has the patient fallen in the past 6 months No   Has the patient had a decrease in activity level because of a fear of falling?  Yes   Is the patient reluctant to leave their home because of a fear of falling?  Yes   [place] residence   Type of Home Apartment   Home Access Stairs to enter   Entrance Stairs-Number of Steps [place] One level   Prior Function   Level of Independence Independent   ROM / Strength   AROM / PROM /  Strength AROM   Strength   Left Hip Flexion 3+/5   Left Knee Flexion 4-/5  pain with resistance   Left Knee Extension 4/5  pain with resistance   Transfers   Transfers Sit to Stand   Ambulation/Gait   Ambulation/Gait Yes   Ambulation/Gait Assistance 6: Modified independent (Device/Increase time)   Ambulation Distance (Feet) 100 Feet   Assistive device None  needs a cane for safety   Gait Pattern Antalgic  due to LLE pain   Ambulation Surface Level;Indoor   Gait velocity 2.38   Stairs Yes   Stairs Assistance 6: Modified independent (Device/Increase time)   Stair Management Technique Two rails;Step to pattern   Number of Stairs 4   Height of Stairs 6   Timed Up and Go Test   Normal TUG (seconds) 17.72                           PT Education - 12/23/15 1303    Education provided Yes    Education Details correct use of single point cane with ambulation   Person(s) Educated Patient   Methods Explanation;Demonstration   Comprehension Verbalized understanding;Returned demonstration                    Plan - 12/24/15 1508    Clinical Impression Statement Pt presents with antalgic gait pattern due to c/o L knee and LLE pain - due to injury sustained at work in 2009 (medial meniscal tear and patella chondromalacia); pt does not qualify for any follow up PT sessions with Medicaid as primary insurance; pt sates she will be getting Medicar and plans to return to PT when she  receives her Medicare card   Pt will benefit from skilled therapeutic intervention in order to improve on the following deficits Difficulty walking;Abnormal gait;Pain;Decreased mobility;Decreased strength;Decreased balance   Rehab Potential Good   PT Frequency 1x / week   PT Duration --  1 week - eval only until pt receives Medicare card   PT Treatment/Interventions ADLs/Self Care Home Management;Neuromuscular re-education;Gait training;Stair training;Functional mobility training;Therapeutic exercise   PT Next Visit Plan re-eval and establish goals when pt receives Medicare - this is pt's request as she has been told she will not qualify for visits through Medicaid   PT Home Exercise Plan pt to purchase cane for assistance with ambulation   Consulted and Agree with Plan of Care Patient         Problem List There are no active problems to display for this patient.   [id number], PT 12/24/2015, 3:21 PM  [facility] [address] [place], [place], [address] Phone: [phone]   Fax:  [phone]  Name: Jennifer Foley MRN: [id number] Date of Birth: [birth year]

## 2016-01-24 ENCOUNTER — Ambulatory Visit: Payer: Medicare Other | Attending: Primary Care | Admitting: Physical Therapy

## 2016-01-24 DIAGNOSIS — M25562 Pain in left knee: Secondary | ICD-10-CM | POA: Insufficient documentation

## 2016-01-24 DIAGNOSIS — R269 Unspecified abnormalities of gait and mobility: Secondary | ICD-10-CM | POA: Insufficient documentation

## 2016-01-24 DIAGNOSIS — R2689 Other abnormalities of gait and mobility: Secondary | ICD-10-CM | POA: Insufficient documentation

## 2016-01-25 ENCOUNTER — Ambulatory Visit: Payer: Medicare Other | Admitting: Physical Therapy

## 2016-01-25 DIAGNOSIS — R2689 Other abnormalities of gait and mobility: Secondary | ICD-10-CM

## 2016-01-25 DIAGNOSIS — M25562 Pain in left knee: Secondary | ICD-10-CM | POA: Diagnosis present

## 2016-01-25 DIAGNOSIS — R269 Unspecified abnormalities of gait and mobility: Secondary | ICD-10-CM | POA: Diagnosis not present

## 2016-01-25 NOTE — Therapy (Signed)
[facility] [address] [place], [place], [address] Phone: [phone]   Fax:  [phone]  Physical Therapy Treatment  Patient Details  Name: Jennifer Foley MRN: [id number] Date of Birth: 10-Mar-1956 Referring Provider: Juluis Mire, NP  Encounter Date: 01/25/2016      PT End of Session - 01/25/16 1628    Visit Number 2   Number of Visits 2   PT Start Time [id number]   PT Stop Time [id number]   PT Time Calculation (min) 45 min   Activity Tolerance Patient tolerated treatment well      Past Medical History  Diagnosis Date  . HTN (hypertension)   . Hyperlipemia   . INR (international normal ratio) abnormal   . Atrial fibrillation ([place])   . DM (diabetes mellitus) ([place])   . Hyperthyroidism   . Anemia   . Esophageal reflux   . MRSA (methicillin resistant staph aureus) culture positive   . Pneumonia     Past Surgical History  Procedure Laterality Date  . Abdominal hysterectomy    . Hernia repair      There were no vitals filed for this visit.  Visit Diagnosis:  Gait difficulty  Left knee pain      Subjective Assessment - 01/25/16 1544    Subjective Pt reports her left knee is sore and she almost fell as she was getting out of her car yesterday; didn't actually fall; feel the knee is burning; came with a quad cane; but stated she doesn't really want to have to use a cane but figures she will have too due to the knee pain;    Currently in Pain? Yes   Pain Score 8    Pain Location Knee   Pain Orientation Left   Pain Descriptors / Indicators Burning;Aching   Pain Type Chronic pain   Pain Onset More than a month ago   Pain Frequency Constant   Aggravating Factors  climbing stairs; squats;  standing ; walking   Pain Relieving Factors sit down; try to prop it up   Effect of Pain on Daily Activities limits walking;  getting in/out of home is difficult on second floor; limits leaving her home              Therapeutic exercise Instructed in the HEP established; SLR; hip add; hip abd; quad sets;  Attempted bridge but this aggravated her back  2x 10 each  Stair training  ; correct sequence to protect left knee ; sba with vq's   Manual therapy KT tape to left knee Patella mobilization  Multi-direction; grade 2-3 Tibial posterior to anterior glide grade 2-3 Cross friction massage to patella tendon/bursae                     PT Education - 01/25/16 1628    Education provided Yes   Education Details correct sequence going up/down steps; and HEP for knee stabilization   Person(s) Educated Patient   Methods Explanation;Handout;Demonstration   Comprehension Returned demonstration             PT Long Term Goals - 01/25/16 1648    PT LONG TERM GOAL #1   Title Patient able to improve her pain rating to a 2-3/10 max with walking 1000' with single point cane   Baseline current 8/10 with short distances   Time 4   Period Weeks   Status New   PT LONG TERM GOAL #2   Title Patient able to  complete a TUG in <12 seconds w/o loss of balance and using cane in proper squence.    Baseline 17 seconds   Time 4   Period Weeks   Status New   PT LONG TERM GOAL #3   Title Patient to improve her gait speed to 2.9 ft /sec with cane.    Baseline amb 2.38 ft/sec   Time 4   Period Weeks   Status New               Plan - 02-14-2016 1658    Clinical Impression Statement Pt has effusion under her patella tendon; positivie meniscus test - lateral more than medial; acl/pcl intact; has pain with patella mobility; palpable crepitus subpatella - coincides with patella chondromalacia; she is walking with a cane with proper sequecne and was able to do steps with proper patter; pt responsded well to the manual therpay and KT tape; the ex's made her knee ' a little agitated' but she understood she needs to get stronger when doing the ex's; she could feel how week she was; she  was agreeable to doing the HEP as directed 3 x day;     Pt will benefit from skilled therapeutic intervention in order to improve on the following deficits Difficulty walking;Abnormal gait;Pain;Decreased mobility;Decreased strength;Decreased balance   Rehab Potential Good   PT Frequency 1x / week   PT Duration 4 weeks   PT Treatment/Interventions ADLs/Self Care Home Management;Neuromuscular re-education;Gait training;Stair training;Functional mobility training;Therapeutic exercise          G-Codes - 02/14/16 1645    Functional Assessment Tool Used Clinical judement; pain scale ; tug 17.72;  gait speed 2.38 ft/sec   Functional Limitation Mobility: Walking and moving around   Mobility: Walking and Moving Around Current Status [phone]) At least 20 percent but less than 40 percent impaired, limited or restricted   Mobility: Walking and Moving Around Goal Status [phone]) At least 1 percent but less than 20 percent impaired, limited or restricted      Problem List There are no active problems to display for this patient.   Rosaura Carpenter D PT DPT  2016/02/14, 5:03 PM  [facility] [address] [place], [place], [address] Phone: [phone]   Fax:  [phone]  Name: Jennifer Foley MRN: [id number] Date of Birth: 1956/09/21

## 2016-01-25 NOTE — Patient Instructions (Signed)
Quad Set: Slight Flexion    Tense muscles on top of left thigh. Hold ____ seconds. Repeat ____ times per set. Do ____ sets per session. Do ____ sessions per day.  http://orth.exer.us/678   Copyright  VHI. All rights reserved.  Quad Set: Slight Flexion    Tense muscles on top of left thigh. Hold ____ seconds. Repeat ____ times per set. Do ____ sets per session. Do ____ sessions per day.  http://orth.exer.us/678   Copyright  VHI. All rights reserved.  Quad Set: Slight Flexion    Tense muscles on top of left thigh. Hold __5__ seconds. Repeat _10___ times per set. Do __3__ sets per session. Do __3__ sessions per day.  http://orth.exer.us/678   Copyright  VHI. All rights reserved.  Straight Leg: with Bent Knee (Supine)    With right leg straight, other leg bent, raise straight leg _12___ inches.  Alternate each repition Repeat _10___ times per set. Do _3___ sets per session. Do _3___ sessions per day.  http://orth.exer.us/686   Copyright  VHI. All rights reserved.  Hip Abduction: Modified    Lying on right side with pillow between thighs, raise top leg from pillow, rotating slightly out. Repeat __10__ times per set. Do _3___ sets per session. Do __3__ sessions per day.  http://orth.exer.us/704   Copyright  VHI. All rights reserved.  Strengthening: Straight Leg Raise (Phase 3)    Resting on hands, tighten muscles on front of left thigh, then lift leg __12__ inches from surface, keeping knee locked. Repeat __10__ times per set. Do ___3_ sets per session. Do __3__ sessions per day.  http://orth.exer.us/618   Copyright  VHI. All rights reserved.  Strengthening: Hip Adduction (Side-Lying)    Tighten muscles on front of right thigh, then lift leg __12__ inches from surface, keeping knee locked.  Repeat __10__ times per set. Do 3____ sets per session. Do ____3 sessions per day.  http://orth.exer.us/624   Copyright  VHI. All rights reserved.

## 2016-01-29 NOTE — Addendum Note (Signed)
Addended by: Lamar Benes on: 01/29/2016 07:35 PM   Modules accepted: Orders

## 2016-01-31 ENCOUNTER — Encounter: Payer: Self-pay | Admitting: Physical Therapy

## 2016-01-31 ENCOUNTER — Ambulatory Visit: Payer: Medicare Other | Admitting: Physical Therapy

## 2016-01-31 DIAGNOSIS — R2689 Other abnormalities of gait and mobility: Secondary | ICD-10-CM

## 2016-01-31 DIAGNOSIS — M25562 Pain in left knee: Secondary | ICD-10-CM

## 2016-01-31 NOTE — Patient Instructions (Addendum)
    Copyright  VHI. All rights reserved.  Quad Set: Slight Flexion    Tense muscles on top of left thigh. Hold __5__ seconds. Repeat _10___ times per set. Do __3__ sets per session. Do __3__ sessions per day.  http://orth.exer.us/678   Copyright  VHI. All rights reserved.  Straight Leg: with Bent Knee (Supine)    With right leg straight, other leg bent, raise straight leg _12___ inches.  Alternate each repition Repeat _10___ times per set. Do _3___ sets per session. Do _3___ sessions per day.  http://orth.exer.us/686   Copyright  VHI. All rights reserved.  Hip Abduction: Modified    Lying on right side with pillow between thighs, raise top leg from pillow, rotating slightly out. Repeat __10__ times per set. Do _3___ sets per session. Do __3__ sessions per day.  http://orth.exer.us/704   Copyright  VHI. All rights reserved.  Strengthening: Hip Adduction (Side-Lying)    Tighten muscles on front of right thigh, then lift leg __12__ inches from surface, keeping knee locked.  Repeat __10__ times per set. Do 3____ sets per session. Do ____3 sessions per day.  http://orth.exer.us/624   Copyright  VHI. All rights reserved.

## 2016-01-31 NOTE — Therapy (Signed)
[facility] [address] [place], [place], [address] Phone: [phone]   Fax:  [phone]  Physical Therapy Treatment  Patient Details  Name: Jennifer Foley MRN: [id number] Date of Birth: 27-Mar-1956 Referring Provider: Juluis Mire, NP  Encounter Date: 01/31/2016      PT End of Session - 01/31/16 1551    Visit Number 3   Number of Visits 2   PT Start Time [id number]   PT Stop Time 1451   PT Time Calculation (min) 38 min   Activity Tolerance Patient tolerated treatment well      Past Medical History  Diagnosis Date  . HTN (hypertension)   . Hyperlipemia   . INR (international normal ratio) abnormal   . Atrial fibrillation ([place])   . DM (diabetes mellitus) ([place])   . Hyperthyroidism   . Anemia   . Esophageal reflux   . MRSA (methicillin resistant staph aureus) culture positive   . Pneumonia     Past Surgical History  Procedure Laterality Date  . Abdominal hysterectomy    . Hernia repair      There were no vitals filed for this visit.      Subjective Assessment - 01/31/16 1415    Subjective Preformed HEP 2-3x since last visit.  Feels like since she has started the exercises the knee pain is worse.  Was able to give the Letter to [place] for possible move to first level due to knee pain.   Currently in Pain? Yes   Pain Score 8    Pain Location Knee   Pain Orientation Left   Pain Descriptors / Indicators Stabbing   Pain Type Chronic pain   Pain Onset More than a month ago                         [facility] Adult PT Treatment/Exercise - 01/31/16 0001    Exercises   Exercises Knee/Hip  Performed HEP as given 4/4 see handout below. LLE 1-2x10                PT Education - 01/31/16 1430    Education provided Yes   Education Details Performed and HEP given 4/4 and addressed technique issue  to relieve L knee pain.   Person(s) Educated Patient   Methods  Explanation;Demonstration;Tactile cues;Verbal cues   Comprehension Verbalized understanding;Returned demonstration;Need further instruction             PT Long Term Goals - 01/25/16 1648    PT LONG TERM GOAL #1   Title Patient able to improve her pain rating to a 2-3/10 max with walking 1000' with single point cane   Baseline current 8/10 with short distances   Time 4   Period Weeks   Status New   PT LONG TERM GOAL #2   Title Patient able to complete a TUG in <12 seconds w/o loss of balance and using cane in proper squence.    Baseline 17 seconds   Time 4   Period Weeks   Status New   PT LONG TERM GOAL #3   Title Patient to improve her gait speed to 2.9 ft /sec with cane.    Baseline amb 2.38 ft/sec   Time 4   Period Weeks   Status New               Plan - 01/31/16 1435    Clinical Impression Statement Noted in HEP review incorrect technique with L  quad set and educated pt to correct technique.  Pt reported that her L knee felt much better at the end of the session.   Rehab Potential Good   PT Frequency 1x / week   PT Duration 4 weeks   PT Treatment/Interventions ADLs/Self Care Home Management;Neuromuscular re-education;Gait training;Stair training;Functional mobility training;Therapeutic exercise   PT Next Visit Plan Cont to work towards [place]      Patient will benefit from skilled therapeutic intervention in order to improve the following deficits and impairments:  Difficulty walking, Abnormal gait, Pain, Decreased mobility, Decreased strength, Decreased balance  Visit Diagnosis: Pain in left knee  Other abnormalities of gait and mobility  Left knee pain     Problem List There are no active problems to display for this patient.   Bjorn Loser, PTA  01/31/2016, 3:56 PM [facility] [address] [place], [place], [address] Phone: [phone]   Fax:  [phone]  Name: Jennifer Foley MRN: [id number] Date of Birth: 02-01-1956

## 2016-02-01 ENCOUNTER — Other Ambulatory Visit: Payer: Self-pay | Admitting: Ophthalmology

## 2016-02-01 DIAGNOSIS — H209 Unspecified iridocyclitis: Secondary | ICD-10-CM

## 2016-02-02 ENCOUNTER — Ambulatory Visit: Payer: Medicare Other | Admitting: Physical Therapy

## 2016-02-14 ENCOUNTER — Ambulatory Visit: Payer: Medicare Other | Admitting: Physical Therapy

## 2016-02-21 ENCOUNTER — Ambulatory Visit: Payer: Medicare Other | Attending: Primary Care | Admitting: Physical Therapy

## 2016-02-21 DIAGNOSIS — R2689 Other abnormalities of gait and mobility: Secondary | ICD-10-CM | POA: Diagnosis present

## 2016-02-21 NOTE — Therapy (Signed)
[facility] [address] [place], [place], [address] Phone: [phone]   Fax:  [phone]  Physical Therapy Treatment  Patient Details  Name: Jennifer Foley MRN: [id number] Date of Birth: 1956/04/29 Referring Provider: Juluis Mire, NP  Encounter Date: 02/21/2016      PT End of Session - 02/23/16 0939    Visit Number 4   Number of Visits 5   PT Start Time [id number]   PT Stop Time [id number]   PT Time Calculation (min) 41 min      Past Medical History  Diagnosis Date  . HTN (hypertension)   . Hyperlipemia   . INR (international normal ratio) abnormal   . Atrial fibrillation ([place])   . DM (diabetes mellitus) ([place])   . Hyperthyroidism   . Anemia   . Esophageal reflux   . MRSA (methicillin resistant staph aureus) culture positive   . Pneumonia     Past Surgical History  Procedure Laterality Date  . Abdominal hysterectomy    . Hernia repair      There were no vitals filed for this visit.      Subjective Assessment - 02/23/16 0936    Subjective Pt states she is doing much better than she was at time of initial eval - knee pain is better; ready for discharge next session   Pertinent History L knee injury 06-28-07 with diagnosis of patellar chondromalacia; arthroscopy 03-31-08 - identified L medial meniscal tear;  DM: HTN: atrial fibrillation   Patient Stated Goals obtain LMN for need for ground floor apartment; increase strength LLE   Currently in Pain? Yes   Pain Score 6    Pain Location Knee   Pain Orientation Left   Pain Descriptors / Indicators Aching;Discomfort;Sore   Pain Type Chronic pain   Pain Onset More than a month ago   Pain Frequency Constant   Multiple Pain Sites No     Pt amb. Without use of cane today - states she does not like to use it a lot in public (feels as if it makes her a target)    TherEx;    seated LAQ's  Red band 7 reps LLE - unable to do 10 reps due to fatigue/discomfort;  seated L knee flexion with  red theraband 10 reps (added these 2 exs. To HEP)  Bridging x 10 reps;  L SLR x 5 reps no resistance;  Red theraband used for 2nd 5 reps  L hip and knee flexion in hooklying position - lifting L leg up toward chest with red theraband for resistance - 10 reps  SAQ's 10 reps - no weight 10 reps with 5 sec hold  Step negotiation for strengthening with use of hand rail x 1 rep Heel raises in standing x 10 reps - bil. LE's  Leg press bil. LE's 40# 1 set 15 reps                         PT Education - 02/23/16 [phone]    Education provided Yes   Education Details LAQ and knee flexion with red theraband   Person(s) Educated Patient   Methods Explanation;Demonstration;Handout   Comprehension Verbalized understanding;Returned demonstration             PT Long Term Goals - 01/25/16 1648    PT LONG TERM GOAL #1   Title Patient able to improve her pain rating to a 2-3/10 max with walking 1000' with single point  cane   Baseline current 8/10 with short distances   Time 4   Period Weeks   Status New   PT LONG TERM GOAL #2   Title Patient able to complete a TUG in <12 seconds w/o loss of balance and using cane in proper squence.    Baseline 17 seconds   Time 4   Period Weeks   Status New   PT LONG TERM GOAL #3   Title Patient to improve her gait speed to 2.9 ft /sec with cane.    Baseline amb 2.38 ft/sec   Time 4   Period Weeks   Status New               Plan - 02/23/16 0940    Clinical Impression Statement Pt improving with L quad strength and mobility - amb. today without use of cane but uses it at times depending on L knee pain; progressing towards LTG's - plan D/C next  session   Rehab Potential Good   PT Frequency 1x / week   PT Duration 4 weeks   PT Treatment/Interventions ADLs/Self Care Home Management;Neuromuscular re-education;Gait training;Stair training;Functional mobility training;Therapeutic exercise   PT Next  Visit Plan D/C next session - check LTG's   PT Home Exercise Plan added knee flexion and LAQ with red theraband on 02-21-16   Consulted and Agree with Plan of Care Patient      Patient will benefit from skilled therapeutic intervention in order to improve the following deficits and impairments:  Difficulty walking, Abnormal gait, Pain, Decreased mobility, Decreased strength, Decreased balance  Visit Diagnosis: Other abnormalities of gait and mobility     Problem List There are no active problems to display for this patient.   Alda Lea, PT 02/23/2016, 9:45 AM  [facility] [address] [place], [place], [address] Phone: [phone]   Fax:  [phone]  Name: Jennifer Foley MRN: [id number] Date of Birth: Jul 19, 1956

## 2016-02-21 NOTE — Patient Instructions (Signed)
Knee Flexion: Resisted (Sitting)    Sit with band under left foot and looped around ankle of supported leg. Pull unsupported leg back. Repeat _10___ times per set. Do _2__ sets per session. Do __1__ sessions per day.  http://orth.exer.us/695   Copyright  VHI. All rights reserved.  Knee Extension: Resisted (Sitting)    With band looped around right ankle and under other foot, straighten leg with ankle loop. Keep other leg bent to increase resistance. Repeat _10___ times per set. Do _2___ sets per session. Do __1__ sessions per day.  http://orth.exer.us/691   Copyright  VHI. All rights reserved.

## 2016-02-28 ENCOUNTER — Ambulatory Visit: Payer: Medicare Other | Admitting: Physical Therapy

## 2016-03-27 ENCOUNTER — Encounter: Payer: Medicare Other | Admitting: Internal Medicine

## 2016-03-27 ENCOUNTER — Other Ambulatory Visit (HOSPITAL_COMMUNITY): Payer: Self-pay | Admitting: Endocrinology

## 2016-03-27 DIAGNOSIS — E0590 Thyrotoxicosis, unspecified without thyrotoxic crisis or storm: Secondary | ICD-10-CM

## 2016-03-28 ENCOUNTER — Encounter: Payer: Self-pay | Admitting: Internal Medicine

## 2016-04-04 ENCOUNTER — Encounter (HOSPITAL_COMMUNITY)
Admission: RE | Admit: 2016-04-04 | Discharge: 2016-04-04 | Disposition: A | Discharge status: Home / Self Care | Payer: Medicare Other | Source: Ambulatory Visit | Attending: Endocrinology | Admitting: Endocrinology

## 2016-04-04 ENCOUNTER — Ambulatory Visit (HOSPITAL_COMMUNITY)
Admission: RE | Admit: 2016-04-04 | Discharge: 2016-04-04 | Disposition: A | Discharge status: Home / Self Care | Payer: Medicare Other | Source: Ambulatory Visit | Attending: Endocrinology | Admitting: Endocrinology

## 2016-04-04 DIAGNOSIS — E0590 Thyrotoxicosis, unspecified without thyrotoxic crisis or storm: Secondary | ICD-10-CM | POA: Insufficient documentation

## 2016-04-04 MED ORDER — SODIUM IODIDE I 131 CAPSULE
6.8000 | Freq: Once | INTRAVENOUS | Status: AC | PRN
  Administered 2016-04-04: 6.8 via ORAL

## 2016-04-05 ENCOUNTER — Encounter (HOSPITAL_COMMUNITY)
Admission: RE | Admit: 2016-04-05 | Discharge: 2016-04-05 | Disposition: A | Discharge status: Home / Self Care | Payer: Medicare Other | Source: Ambulatory Visit | Attending: Endocrinology | Admitting: Endocrinology

## 2016-04-05 ENCOUNTER — Encounter (HOSPITAL_COMMUNITY): Payer: Medicare Other

## 2016-04-05 DIAGNOSIS — E0590 Thyrotoxicosis, unspecified without thyrotoxic crisis or storm: Secondary | ICD-10-CM | POA: Diagnosis present

## 2016-04-05 MED ORDER — SODIUM IODIDE I 131 CAPSULE
6.8000 | Freq: Once | INTRAVENOUS | Status: AC | PRN
  Administered 2016-04-04: 6.8 via ORAL

## 2016-04-05 MED ORDER — SODIUM PERTECHNETATE TC 99M INJECTION
10.0000 | Freq: Once | INTRAVENOUS | Status: AC | PRN
  Administered 2016-04-05: 10 via INTRAVENOUS

## 2016-04-07 ENCOUNTER — Other Ambulatory Visit: Payer: Self-pay | Admitting: Endocrinology

## 2016-04-07 DIAGNOSIS — E0590 Thyrotoxicosis, unspecified without thyrotoxic crisis or storm: Secondary | ICD-10-CM

## 2016-04-07 DIAGNOSIS — E041 Nontoxic single thyroid nodule: Secondary | ICD-10-CM

## 2016-04-13 ENCOUNTER — Ambulatory Visit
Admission: RE | Admit: 2016-04-13 | Discharge: 2016-04-13 | Disposition: A | Discharge status: Home / Self Care | Payer: Medicare Other | Source: Ambulatory Visit | Attending: Endocrinology | Admitting: Endocrinology

## 2016-04-13 DIAGNOSIS — E0590 Thyrotoxicosis, unspecified without thyrotoxic crisis or storm: Secondary | ICD-10-CM

## 2016-05-03 ENCOUNTER — Other Ambulatory Visit: Payer: Self-pay | Admitting: Endocrinology

## 2016-05-03 DIAGNOSIS — E041 Nontoxic single thyroid nodule: Secondary | ICD-10-CM

## 2016-05-09 ENCOUNTER — Ambulatory Visit (INDEPENDENT_AMBULATORY_CARE_PROVIDER_SITE_OTHER): Payer: Medicare Other | Admitting: Cardiology

## 2016-05-09 ENCOUNTER — Encounter (INDEPENDENT_AMBULATORY_CARE_PROVIDER_SITE_OTHER): Payer: Self-pay

## 2016-05-09 ENCOUNTER — Encounter: Payer: Self-pay | Admitting: Cardiology

## 2016-05-09 VITALS — BP 122/64 | HR 107 | Ht 61.0 in | Wt 207.6 lb

## 2016-05-09 DIAGNOSIS — I480 Paroxysmal atrial fibrillation: Secondary | ICD-10-CM

## 2016-05-09 NOTE — Progress Notes (Signed)
Electrophysiology Office Note   Date:  05/09/2016   ID:  Jennifer Foley, DOB 25-Jan-1956, MRN [id number]  PCP:  Smothers, Andree Elk, NP   Primary Electrophysiologist:  Constance Haw, MD    Chief Complaint  Patient presents with  . New Patient (Initial Visit)  . Atrial Fibrillation     History of Present Illness: Jennifer Foley is a 60 y.o. female who presents today for electrophysiology evaluation.   History of atrial fibrillation, HTN, HLD, DM.  She presents for follow-up of her atrial fibrillation. She wore a 48 hour monitor in December that showed no evidence of atrial fibrillation. She says that she occasionally has sharp chest pain that lasts a few seconds. She says the pain is worse when she is moving around. She can lie down the makes the pain feel better. Aside from that she has called EMS a few times in the past with symptoms of fatigue and shortness of breath that she has been told was anxiety. There is no evidence of atrial fibrillation per her report during those episodes. Otherwise, she feels well other than seeing a lot of doctors recently. She is planned to have a thyroid biopsy and is therefore not taking hers relative at this time.   Today, she denies symptoms of palpitations, chest pain, shortness of breath, orthopnea, PND, lower extremity edema, claudication, dizziness, presyncope, syncope, bleeding, or neurologic sequela. The patient is tolerating medications without difficulties and is otherwise without complaint today.    Past Medical History  Diagnosis Date  . HTN (hypertension)   . Hyperlipemia   . INR (international normal ratio) abnormal   . Atrial fibrillation ([place])   . DM (diabetes mellitus) ([place])   . Hyperthyroidism   . Anemia   . Esophageal reflux   . MRSA (methicillin resistant staph aureus) culture positive   . Pneumonia   . Depression with anxiety    Past Surgical History  Procedure Laterality Date  . Abdominal hysterectomy    .  Hernia repair       Current Outpatient Prescriptions  Medication Sig Dispense Refill  . albuterol (PROVENTIL HFA;VENTOLIN HFA) 108 (90 Base) MCG/ACT inhaler Inhale 2 puffs into the lungs every 6 (six) hours as needed for wheezing or shortness of breath.    . busPIRone (BUSPAR) 10 MG tablet Take 10 mg by mouth 2 (two) times daily.    . ferrous gluconate (FERGON) 325 MG tablet Take 325 mg by mouth 3 (three) times daily.     . furosemide (LASIX) 40 MG tablet Take 40 mg by mouth daily.    . hydrOXYzine (ATARAX/VISTARIL) 50 MG tablet Take 50 mg by mouth at bedtime as needed for anxiety.     [facility] lisinopril-hydrochlorothiazide (PRINZIDE,ZESTORETIC) 20-12.5 MG per tablet Take 1 tablet by mouth daily.    . metFORMIN (GLUCOPHAGE) 500 MG tablet Take 500 mg by mouth every evening.    . naproxen (NAPROSYN) 250 MG tablet Take 550 mg by mouth daily as needed for mild pain.     Mar[facility] Kitchen omeprazole (PRILOSEC) 20 MG capsule Take 20 mg by mouth daily.    . pravastatin (PRAVACHOL) 40 MG tablet Take 40 mg by mouth daily.    . Rivaroxaban (XARELTO) 15 MG TABS tablet Take 15 mg by mouth every evening.     . sertraline (ZOLOFT) 50 MG tablet Take 50 mg by mouth daily.    [facility] Kitchen terbinafine (LAMISIL) 250 MG tablet Take 250 mg by mouth daily.    . traMADol (ULTRAM) 50  MG tablet Take 50 mg by mouth every 6 (six) hours as needed for moderate pain.      No current facility-administered medications for this visit.    Allergies:   Codeine   Social History:  The patient  reports that she quit smoking about 12 years ago. Her smokeless tobacco use includes Snuff.   Family History:  The patient's family history includes Alcoholism in her father; Arthritis in her father; Diabetes Mellitus I in her mother; Epilepsy in her brother; Hypertension in her father; Renal Disease in her mother; Stroke in her mother.    ROS:  Please see the history of present illness.   Otherwise, review of systems is positive for appetite change, weakness short  of breath, snoring, constipation, anxiety, depression, back pain, easy bruising, headaches.   All other systems are reviewed and negative.    PHYSICAL EXAM: VS:  BP 122/64 mmHg  Pulse 107  Ht 5\' 1"  (1.549 m)  Wt 207 lb 9.6 oz (94.167 kg)  BMI 39.25 kg/m2 , BMI Body mass index is 39.25 kg/(m^2). GEN: Well nourished, well developed, in no acute distress HEENT: normal Neck: no JVD, carotid bruits, or masses Cardiac: RRR; no murmurs, rubs, or gallops,no edema  Respiratory:  clear to auscultation bilaterally, normal work of breathing GI: soft, nontender, nondistended, + BS MS: no deformity or atrophy Skin: warm and dry Neuro:  Strength and sensation are intact Psych: euthymic mood, full affect  EKG:  EKG is ordered today. The ekg ordered today shows sinus rhythm, rate 107, nonspecific T wave abnormality is a follow-up November as procedure patient  Recent Labs: 12/24/2015: ALT 9*; BUN 29*; Creatinine, Ser 1.74*; Hemoglobin 11.5*; Platelets 232; Potassium 3.4*; Sodium 140    Lipid Panel  No results found for: CHOL, TRIG, HDL, CHOLHDL, VLDL, LDLCALC, LDLDIRECT   Wt Readings from Last 3 Encounters:  05/09/16 207 lb 9.6 oz (94.167 kg)  10/06/15 201 lb 9.6 oz (91.445 kg)  07/07/15 193 lb 3.2 oz (87.635 kg)      Other studies Reviewed: Additional studies/ records that were reviewed today include: Epic notes   ASSESSMENT AND PLAN:  1.  Paroxysmal atrial fibrillation: On rivaroxaban. At this time, she is in sinus rhythm. She has recently worn a 48 hour monitor that did not show any evidence of atrial fibrillation. It is unclear whether or not she is having symptoms of her atrial fibrillation. Due to that, I have told her to keep track of her symptomatology and let us know. We Rinda Rollyson see her back in November or December. She does feel, it seems, quite overwhelmed by the number of physicians she is seeing currently. We Tensley Wery therefore, with a further treatment plan at that time. I have told  her to call back if she has any further questions or issues.  This patients CHA2DS2-VASc Score and unadjusted Ischemic Stroke Rate (% per year) is equal to 3.2 % stroke rate/year from a score of 3  Above score calculated as 1 point each if present [CHF, HTN, DM, Vascular=MI/PAD/Aortic Plaque, Age if 65-74, or Female] Above score calculated as 2 points each if present [Age > 75, or Stroke/TIA/TE]   Current medicines are reviewed at length with the patient today.   The patient does not have concerns regarding her medicines.  The following changes were made today:  none  Labs/ tests ordered today include:  Orders Placed This Encounter  Procedures  . EKG 12-Lead     Disposition:   FU with Misti Towle  [place] 5 months  Signed, Devonta Blanford Meredith Leeds, MD  05/09/2016 4:31 PM     [place] [address] [phone] (office) [phone] (fax)

## 2016-05-09 NOTE — Patient Instructions (Signed)
Medication Instructions:  Your physician recommends that you continue on your current medications as directed. Please refer to the Current Medication list given to you today.  --- If you need a refill on your cardiac medications before your next appointment, please call your pharmacy. ---  Labwork: None ordered  Testing/Procedures: None  ordered  Follow-Up: Your physician wants you to follow-up in: November/December with Dr. Curt Bears. You will receive a reminder letter in the mail two months in advance. If you don't receive a letter, please call our office to schedule the follow-up appointment.  Thank you for choosing [facility]!!   Trinidad Curet, RN [phone]

## 2016-05-10 ENCOUNTER — Ambulatory Visit
Admission: RE | Admit: 2016-05-10 | Discharge: 2016-05-10 | Disposition: A | Discharge status: Home / Self Care | Payer: Medicare Other | Source: Ambulatory Visit | Attending: Endocrinology | Admitting: Endocrinology

## 2016-05-10 ENCOUNTER — Other Ambulatory Visit (HOSPITAL_COMMUNITY)
Admission: RE | Admit: 2016-05-10 | Discharge: 2016-05-10 | Disposition: A | Discharge status: Home / Self Care | Payer: Medicare Other | Source: Ambulatory Visit | Attending: Interventional Radiology | Admitting: Interventional Radiology

## 2016-05-10 DIAGNOSIS — E041 Nontoxic single thyroid nodule: Secondary | ICD-10-CM

## 2016-05-19 ENCOUNTER — Other Ambulatory Visit (HOSPITAL_COMMUNITY): Payer: Self-pay | Admitting: Endocrinology

## 2016-05-19 DIAGNOSIS — E0590 Thyrotoxicosis, unspecified without thyrotoxic crisis or storm: Secondary | ICD-10-CM

## 2016-06-01 ENCOUNTER — Encounter (HOSPITAL_COMMUNITY): Payer: Medicaid Other

## 2016-06-12 ENCOUNTER — Encounter (HOSPITAL_COMMUNITY)
Admission: RE | Admit: 2016-06-12 | Discharge: 2016-06-12 | Disposition: A | Discharge status: Home / Self Care | Payer: Medicare Other | Source: Ambulatory Visit | Attending: Endocrinology | Admitting: Endocrinology

## 2016-06-12 DIAGNOSIS — E0590 Thyrotoxicosis, unspecified without thyrotoxic crisis or storm: Secondary | ICD-10-CM | POA: Diagnosis present

## 2016-06-12 LAB — HCG, SERUM, QUALITATIVE: PREG SERUM: NEGATIVE

## 2016-06-12 MED ORDER — SODIUM IODIDE I 131 CAPSULE
30.0000 | Freq: Once | INTRAVENOUS | Status: AC | PRN
  Administered 2016-06-12: 30 via ORAL

## 2016-10-23 HISTORY — PX: CATARACT EXTRACTION: SUR2

## 2016-11-15 ENCOUNTER — Emergency Department (HOSPITAL_COMMUNITY)
Admission: EM | Admit: 2016-11-15 | Discharge: 2016-11-15 | Disposition: A | Discharge status: Home / Self Care | Payer: Medicare Other | Attending: Emergency Medicine | Admitting: Emergency Medicine

## 2016-11-15 ENCOUNTER — Emergency Department (HOSPITAL_COMMUNITY): Payer: Medicare Other

## 2016-11-15 ENCOUNTER — Encounter (HOSPITAL_COMMUNITY): Payer: Self-pay | Admitting: Emergency Medicine

## 2016-11-15 DIAGNOSIS — Z79899 Other long term (current) drug therapy: Secondary | ICD-10-CM | POA: Insufficient documentation

## 2016-11-15 DIAGNOSIS — E119 Type 2 diabetes mellitus without complications: Secondary | ICD-10-CM | POA: Diagnosis not present

## 2016-11-15 DIAGNOSIS — I10 Essential (primary) hypertension: Secondary | ICD-10-CM | POA: Diagnosis not present

## 2016-11-15 DIAGNOSIS — R05 Cough: Secondary | ICD-10-CM | POA: Diagnosis present

## 2016-11-15 DIAGNOSIS — Z7984 Long term (current) use of oral hypoglycemic drugs: Secondary | ICD-10-CM | POA: Diagnosis not present

## 2016-11-15 DIAGNOSIS — M791 Myalgia, unspecified site: Secondary | ICD-10-CM

## 2016-11-15 DIAGNOSIS — R059 Cough, unspecified: Secondary | ICD-10-CM

## 2016-11-15 DIAGNOSIS — Z7901 Long term (current) use of anticoagulants: Secondary | ICD-10-CM | POA: Insufficient documentation

## 2016-11-15 DIAGNOSIS — H6121 Impacted cerumen, right ear: Secondary | ICD-10-CM | POA: Insufficient documentation

## 2016-11-15 DIAGNOSIS — Z87891 Personal history of nicotine dependence: Secondary | ICD-10-CM | POA: Insufficient documentation

## 2016-11-15 MED ORDER — BENZONATATE 100 MG PO CAPS: 100.0000 mg | ORAL_CAPSULE | Freq: Three times a day (TID) | ORAL | 0 refills | Status: DC

## 2016-11-15 MED ORDER — AZITHROMYCIN 250 MG PO TABS: ORAL_TABLET | ORAL | 0 refills | Status: DC

## 2016-11-15 MED ORDER — BENZONATATE 100 MG PO CAPS
100.0000 mg | ORAL_CAPSULE | Freq: Once | ORAL | Status: AC
  Administered 2016-11-15: 100 mg via ORAL
  Filled 2016-11-15: qty 1

## 2016-11-15 NOTE — ED Provider Notes (Signed)
[facility] DEPT Provider Note   CSN: [id number] Arrival date & time: 11/15/16  1018     History   Chief Complaint Chief Complaint  Patient presents with  . Back Pain  . Cough    HPI Jennifer Foley is a 61 y.o. female.  HPI   61 year old female with history of diabetes, atrial fibrillation currently on Xarelto, hypertension, reflux presenting complaining of cough. Patient report for the past 2 weeks she has had persistent cough occasionally productive with phlegm. She also complaining of mild congestion, throat irritation, fluttering sensation in her right ear as well as having pain to her lower back. Cough is worsening at nighttime. She also endorsed mild nausea, has one episode of vomiting several days prior but no diarrhea. She has been taking Tylenol with some relief. She has had a flu shot but no pneumonia shot. She denies any recent sick contact. She is a former smoker. She is currently on lisinopril blood pressure medication but has been on the medication for quite a while. She denies any significant fever, severe headache, shortness of breath, hemoptysis, dysuria, hematuria, bowel bladder incontinence or saddle anesthesia. She denies any rash.  Past Medical History:  Diagnosis Date  . Anemia   . Atrial fibrillation ([place])   . Depression with anxiety   . DM (diabetes mellitus) ([place])   . Esophageal reflux   . HTN (hypertension)   . Hyperlipemia   . Hyperthyroidism   . INR (international normal ratio) abnormal   . MRSA (methicillin resistant staph aureus) culture positive   . Pneumonia     There are no active problems to display for this patient.   Past Surgical History:  Procedure Laterality Date  . ABDOMINAL HYSTERECTOMY    . HERNIA REPAIR      OB History    No data available       Home Medications    Prior to Admission medications   Medication Sig Start Date End Date Taking? Authorizing Provider  albuterol (PROVENTIL HFA;VENTOLIN HFA) 108 (90 Base)  MCG/ACT inhaler Inhale 2 puffs into the lungs every 6 (six) hours as needed for wheezing or shortness of breath.    Historical Provider, MD  busPIRone (BUSPAR) 10 MG tablet Take 10 mg by mouth 2 (two) times daily.    Historical Provider, MD  ferrous gluconate (FERGON) 325 MG tablet Take 325 mg by mouth 3 (three) times daily.     Historical Provider, MD  furosemide (LASIX) 40 MG tablet Take 40 mg by mouth daily.    Historical Provider, MD  hydrOXYzine (ATARAX/VISTARIL) 50 MG tablet Take 50 mg by mouth at bedtime as needed for anxiety.     Historical Provider, MD  lisinopril-hydrochlorothiazide (PRINZIDE,ZESTORETIC) 20-12.5 MG per tablet Take 1 tablet by mouth daily.    Historical Provider, MD  metFORMIN (GLUCOPHAGE) 500 MG tablet Take 500 mg by mouth every evening.    Historical Provider, MD  naproxen (NAPROSYN) 250 MG tablet Take 550 mg by mouth daily as needed for mild pain.     Historical Provider, MD  omeprazole (PRILOSEC) 20 MG capsule Take 20 mg by mouth daily.    Historical Provider, MD  pravastatin (PRAVACHOL) 40 MG tablet Take 40 mg by mouth daily.    Historical Provider, MD  Rivaroxaban (XARELTO) 15 MG TABS tablet Take 15 mg by mouth every evening.     Historical Provider, MD  sertraline (ZOLOFT) 50 MG tablet Take 50 mg by mouth daily.    Historical Provider, MD  terbinafine (  LAMISIL) 250 MG tablet Take 250 mg by mouth daily. 01/25/16   Historical Provider, MD  traMADol (ULTRAM) 50 MG tablet Take 50 mg by mouth every 6 (six) hours as needed for moderate pain.     Historical Provider, MD    Family History Family History  Problem Relation Age of Onset  . Alcoholism Father   . Arthritis Father   . Hypertension Father     sister, mother  . Diabetes Mellitus I Mother     sister  . Renal Disease Mother   . Stroke Mother   . Epilepsy Brother     Social History Social History  Substance Use Topics  . Smoking status: Former Smoker    Quit date: 12/15/2003  . Smokeless tobacco: Current  User    Types: Snuff     Comment: 3 times per day  . Alcohol use Not on file     Allergies   Codeine   Review of Systems Review of Systems  All other systems reviewed and are negative.    Physical Exam Updated Vital Signs BP 136/79 (BP Location: Left Arm)   Pulse 77   Temp 98.2 F (36.8 C) (Oral)   Resp 16   Ht 5\' 2"  (1.575 m)   Wt 95.3 kg   SpO2 97%   BMI 38.41 kg/m   Physical Exam  Constitutional: She appears well-developed and well-nourished. No distress.  HENT:  Head: Atraumatic.  Right Ear: External ear normal.  Left Ear: External ear normal.  Nose: Nose normal.  Mouth/Throat: Oropharynx is clear and moist.  Ears: Mild cerumen impaction noted in right ear but TMs are normal  Eyes: Conjunctivae are normal.  Neck: Normal range of motion. Neck supple.  No nuchal rigidity  Cardiovascular: Normal rate, regular rhythm and intact distal pulses.   Pulmonary/Chest: Effort normal and breath sounds normal.  Scattered rhonchi without overt wheezes, or rales  Abdominal: Soft. She exhibits no distension. There is no tenderness.  Musculoskeletal: She exhibits no edema.  Neurological: She is alert.  Skin: No rash noted.  Psychiatric: She has a normal mood and affect.  Nursing note and vitals reviewed.    ED Treatments / Results  Labs (all labs ordered are listed, but only abnormal results are displayed) Labs Reviewed - No data to display  EKG  EKG Interpretation None       Radiology Dg Chest 2 View  Result Date: 11/15/2016 CLINICAL DATA:  Pain between shoulder blades.  Cough. EXAM: CHEST  2 VIEW COMPARISON:  12/24/2015 FINDINGS: Linear subsegmental atelectasis in the lung bases. Heart is normal size. No effusions or acute bony abnormality. IMPRESSION: Minimal bibasilar atelectasis. Electronically Signed   By: Rolm Baptise M.D.   On: 11/15/2016 11:08    Procedures Procedures (including critical care time)  Medications Ordered in ED Medications    benzonatate (TESSALON) capsule 100 mg (100 mg Oral Given 11/15/16 1401)     Initial Impression / Assessment and Plan / ED Course  I have reviewed the triage vital signs and the nursing notes.  Pertinent labs & imaging results that were available during my care of the patient were reviewed by me and considered in my medical decision making (see chart for details).     BP 115/73   Pulse 62   Temp 97.4 F (36.3 C) (Oral)   Resp 16   Ht 5\' 2"  (1.575 m)   Wt 95.3 kg   SpO2 100%   BMI 38.41 kg/m  Final Clinical Impressions(s) / ED Diagnoses   Final diagnoses:  Cough  Myalgia    New Prescriptions New Prescriptions   AZITHROMYCIN (ZITHROMAX Z-PAK) 250 MG TABLET    2 po day one, then 1 daily x 4 days   BENZONATATE (TESSALON) 100 MG CAPSULE    Take 1 capsule (100 mg total) by mouth every 8 (eight) hours.   1:12 PM Patient here with persistent cough and other symptoms suggestive of a viral etiology. However given the duration of her symptoms, patient may benefit from Z-Pak to cover for atypical superimposed bacterial infection.  Tessalon for cough.  Back pain is reproducible, and diffused.  Likely myalgias from her sickness.  Doubt kidney stone or pyelonephritis.  Pt is not hypoxic, VSS.    Pt stable for discharge, f/u with PCP for further care.  Return precaution discussed.    Domenic Moras, PA-C 11/15/16 [place], MD 11/15/16 [phone]

## 2016-11-15 NOTE — ED Triage Notes (Signed)
Pt c/o cough and back pain x2 weeks, states her back started huring really bad when she coughed. Pt moaning in pain. States shes coughing up a little bit of phlegm.

## 2016-11-15 NOTE — ED Notes (Signed)
[place] at bedside

## 2017-03-01 IMAGING — US US THYROID BIOPSY
1 series · 14 of 22 positions shown · non-contrast
Comparison: none

INDICATION: Two suspicious left thyroid nodules in the lower pole

[Series 1: us thyroid biopsy · 0.08mm/px · 22 acquisitions, 14 frames shown]
[im 1/22]
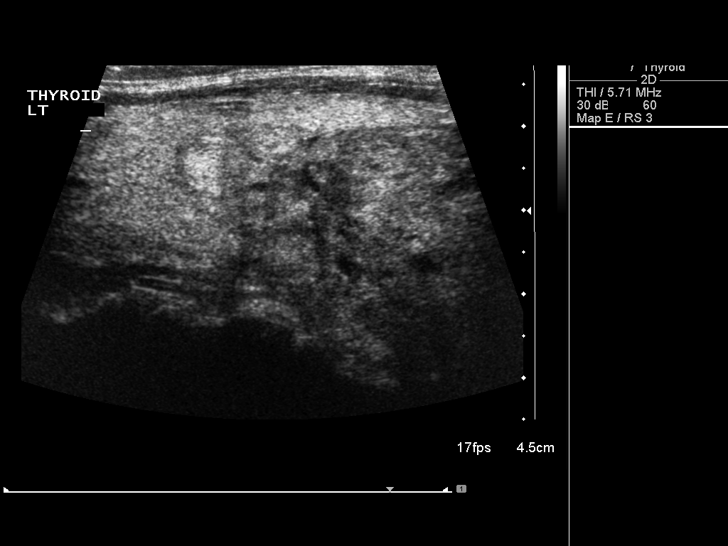
[im 3/22]
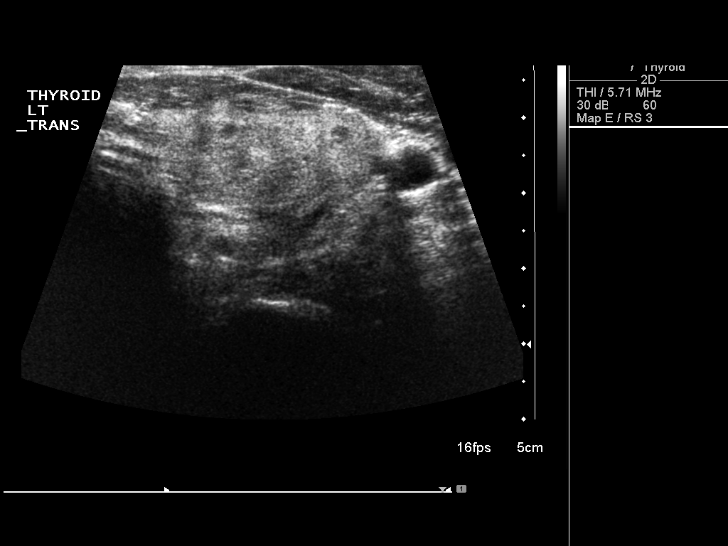
[im 4/22]
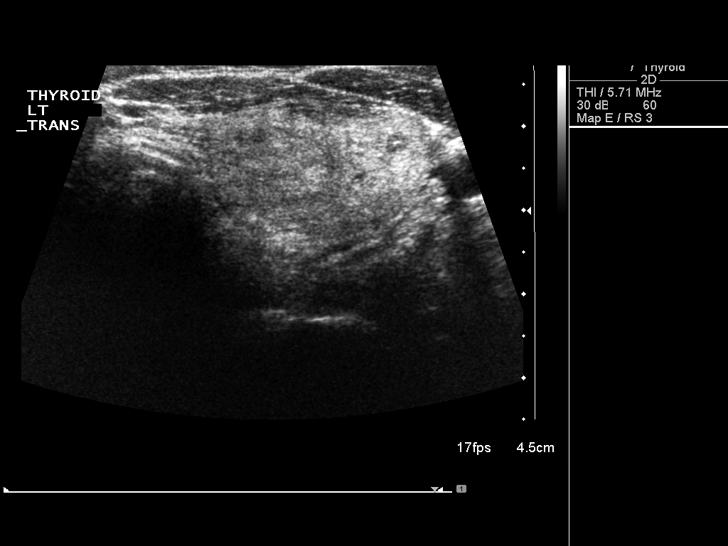
[im 6/22]
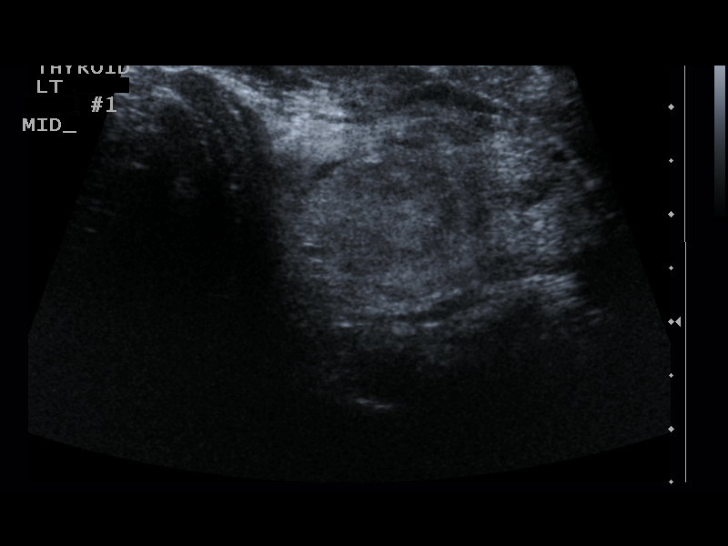
[im 8/22]
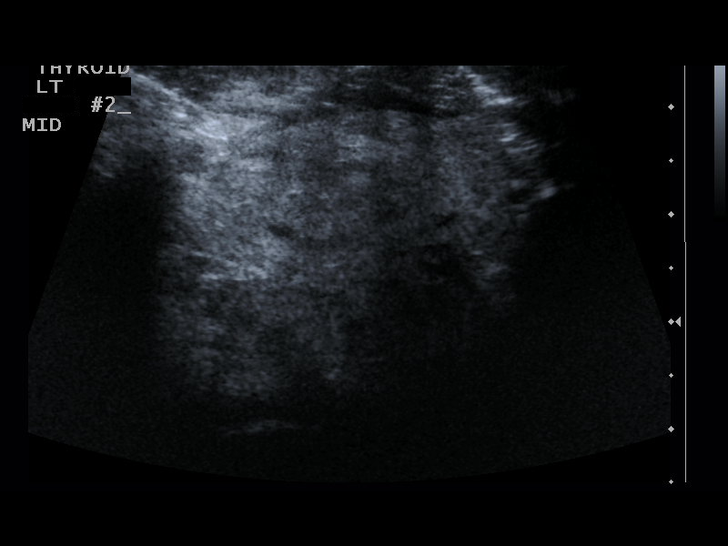
[im 9/22]
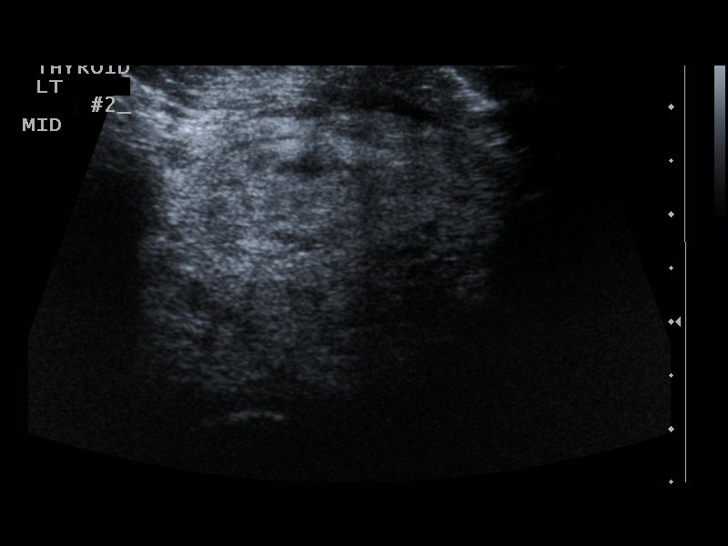
[im 11/22]
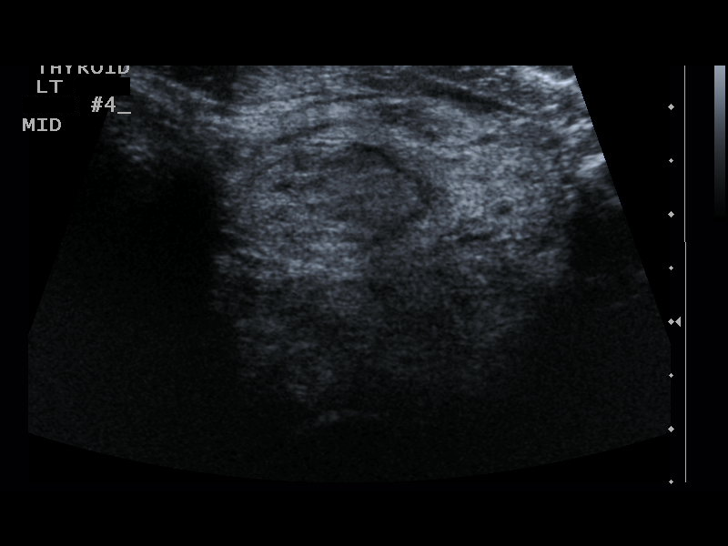
[im 12/22]
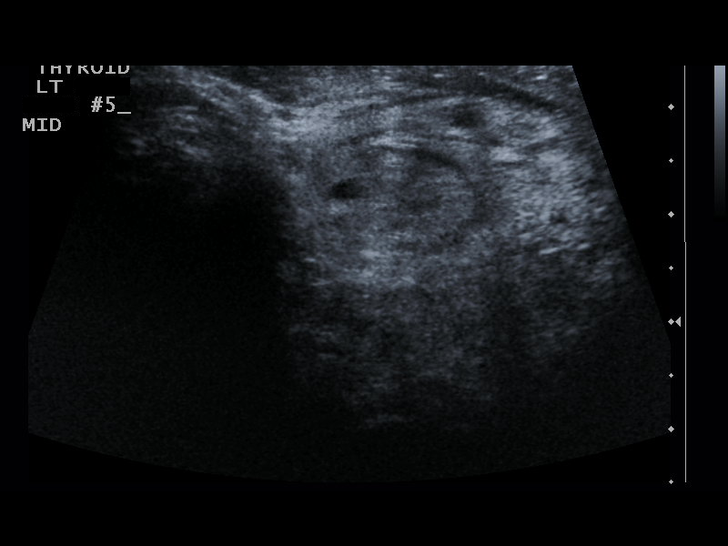
[im 14/22]
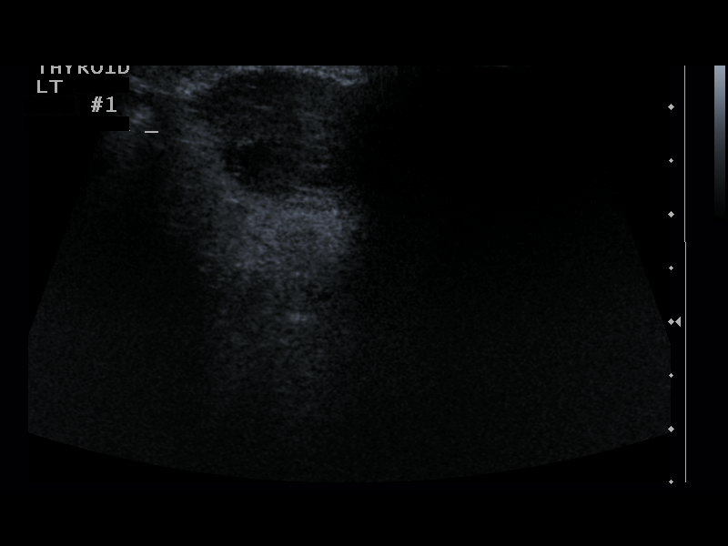
[im 15/22]
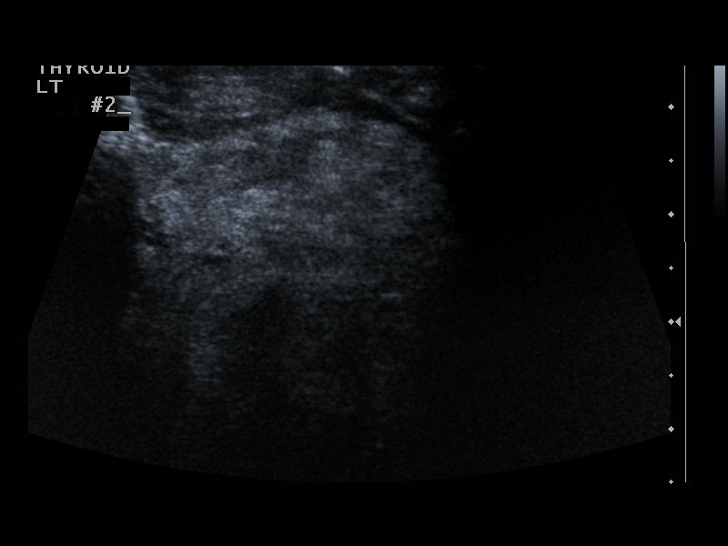
[im 17/22]
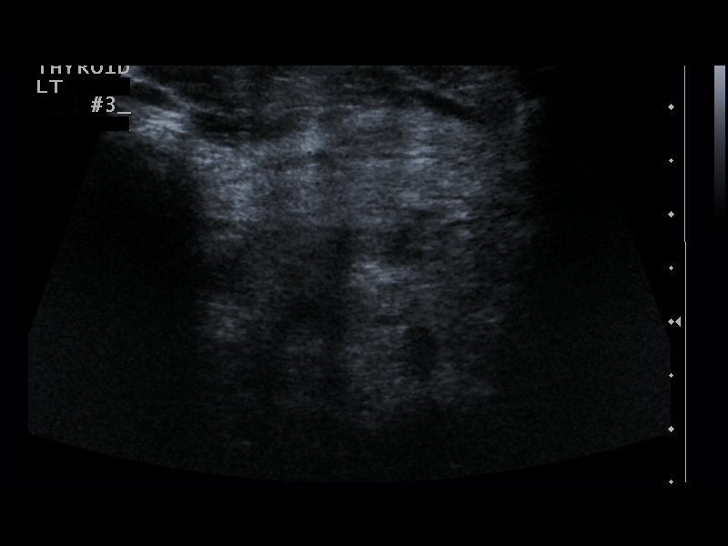
[im 19/22]
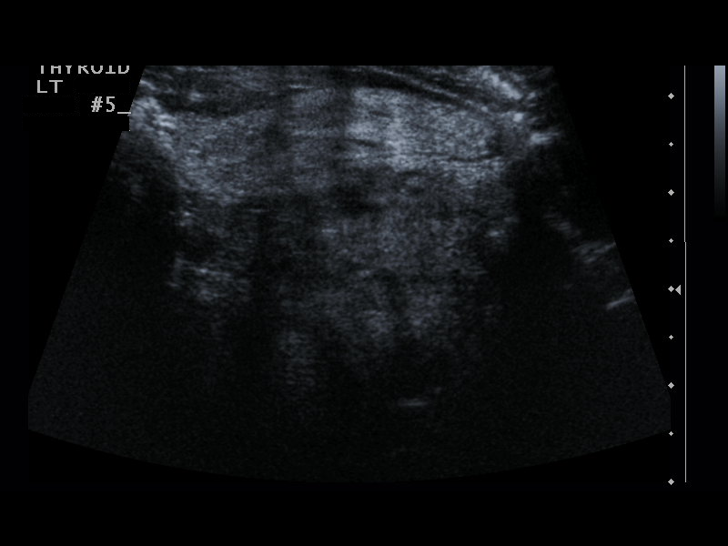
[im 20/22]
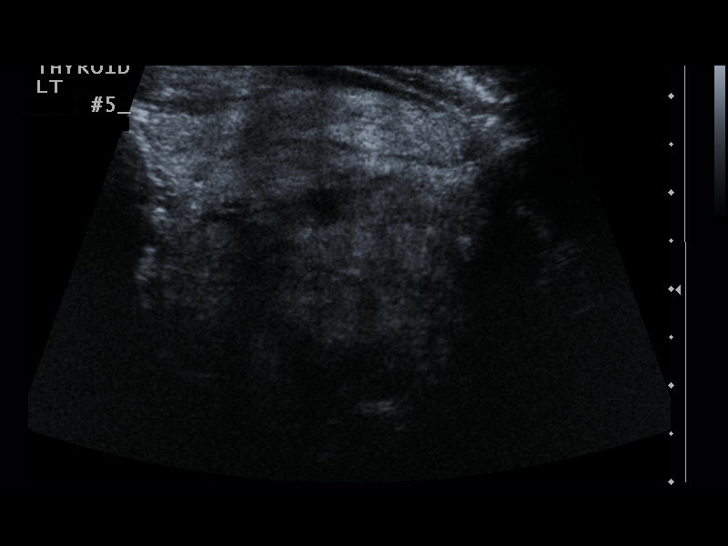
[im 22/22]
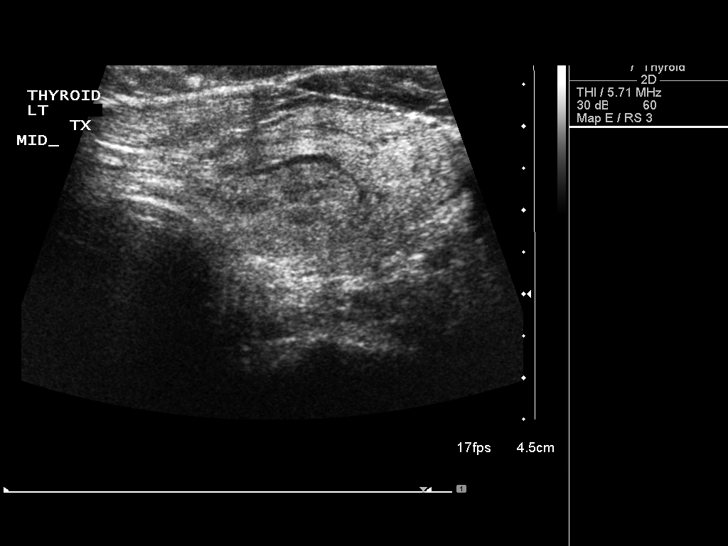

[14 of 22 positions shown; findings below may reference images not displayed]

EXAM:
ULTRASOUND GUIDED NEEDLE ASPIRATE BIOPSY OF THE THYROID GLAND

MEDICATIONS:
None.

ANESTHESIA/SEDATION:
None.

FLUOROSCOPY TIME:  None.

COMPLICATIONS:
None immediate.

PROCEDURE:
Thyroid biopsy was thoroughly discussed with the patient and
questions were answered. The benefits, risks, alternatives, and
complications were also discussed. The patient understands and
wishes to proceed with the procedure. Written consent was obtained.

Ultrasound was performed to localize and mark an adequate site for
the biopsy. The patient was then prepped and draped in a normal
sterile fashion. Local anesthesia was provided with 1% lidocaine.
Using direct ultrasound guidance, 3 passes were made using needles
into the inferior nodule within the left lobe of the thyroid.
Subsequently, 3 passes were made into the nodule within the mid and
left lower lobe region. Ultrasound was used to confirm needle
placements on all occasions. Specimens were sent to Pathology for
analysis.
IMPRESSION: Ultrasound guided needle aspirate biopsy performed of the 2 left
thyroid nodules.

## 2017-03-05 ENCOUNTER — Other Ambulatory Visit: Payer: Self-pay | Admitting: Nephrology

## 2017-03-05 DIAGNOSIS — N183 Chronic kidney disease, stage 3 unspecified: Secondary | ICD-10-CM

## 2017-03-06 ENCOUNTER — Ambulatory Visit
Admission: RE | Admit: 2017-03-06 | Discharge: 2017-03-06 | Disposition: A | Discharge status: Home / Self Care | Payer: Medicare Other | Source: Ambulatory Visit | Attending: Nephrology | Admitting: Nephrology

## 2017-03-06 DIAGNOSIS — N183 Chronic kidney disease, stage 3 unspecified: Secondary | ICD-10-CM

## 2017-07-04 ENCOUNTER — Institutional Professional Consult (permissible substitution): Payer: Self-pay | Admitting: Neurology

## 2017-07-04 ENCOUNTER — Telehealth: Payer: Self-pay

## 2017-07-04 NOTE — Telephone Encounter (Signed)
Pt did not show for their appt with Dr. Athar today.  

## 2017-07-05 ENCOUNTER — Encounter: Payer: Self-pay | Admitting: Neurology

## 2017-07-31 ENCOUNTER — Ambulatory Visit: Payer: Medicare Other | Admitting: Cardiology

## 2017-08-07 ENCOUNTER — Ambulatory Visit (INDEPENDENT_AMBULATORY_CARE_PROVIDER_SITE_OTHER): Payer: Medicare Other | Admitting: Neurology

## 2017-08-07 ENCOUNTER — Encounter: Payer: Self-pay | Admitting: Neurology

## 2017-08-07 VITALS — BP 102/70 | HR 76 | Ht 62.0 in | Wt 215.0 lb

## 2017-08-07 DIAGNOSIS — R0683 Snoring: Secondary | ICD-10-CM | POA: Diagnosis not present

## 2017-08-07 DIAGNOSIS — R51 Headache: Secondary | ICD-10-CM | POA: Diagnosis not present

## 2017-08-07 DIAGNOSIS — I480 Paroxysmal atrial fibrillation: Secondary | ICD-10-CM | POA: Diagnosis not present

## 2017-08-07 DIAGNOSIS — E669 Obesity, unspecified: Secondary | ICD-10-CM

## 2017-08-07 DIAGNOSIS — I509 Heart failure, unspecified: Secondary | ICD-10-CM

## 2017-08-07 DIAGNOSIS — R351 Nocturia: Secondary | ICD-10-CM

## 2017-08-07 DIAGNOSIS — R400 Somnolence: Secondary | ICD-10-CM

## 2017-08-07 DIAGNOSIS — R519 Headache, unspecified: Secondary | ICD-10-CM

## 2017-08-07 NOTE — Patient Instructions (Signed)

## 2017-08-07 NOTE — Progress Notes (Signed)
Subjective:    Patient ID: Jennifer Foley is a 61 y.o. female.  HPI     Star Age, MD, PhD [facility] [address] [place], [place] [address]  Dear Neoma Laming,   I saw your patient, Jennifer Foley, upon your kind request in my neurologic clinic today for initial consultation of her sleep disorder, in particular, concern for underlying obstructive sleep apnea. The patient is unaccompanied today. Of note, patient no showed for an appointment on 07/04/2017. As you know, Jennifer Foley is a 61 year old right-handed woman with an underlying medical history of hypertension, hyperlipidemia, hyperthyroidism, reflux disease, diabetes, depression, anxiety, anemia, A. fib, chronic kidney disease, congestive heart failure, asthma, and obesity, who reports snoring and excessive daytime somnolence. She has never had a sleep study. I reviewed your office note from 05/28/2017, which you kindly included. Her Epworth sleepiness score is 18 out of 24, fatigue score is 56 out of 63. She reports waking up at night at times with shortness of breath. She has no FHx of OSA, no RLS symptoms, has nocturia about twice per night, and AM HAs, about 2 times a week. She reports a bedtime of around 10 or 11 PM, wakeup time around 6 or 7. She lives alone. Her daughter checks on her frequently. She has one daughter and 2 sons. She is disabled. She quit smoking 10 or 11 years ago, does not utilize alcohol currently and drinks caffeine in the form of sodas, typically 2 cans per day. She had a tonsillectomy as a teenager. She has gained weight in the past few years.  Her Past Medical History Is Significant For: Past Medical History:  Diagnosis Date  . Anemia   . Atrial fibrillation ([place])   . Bilateral breast cysts   . CHF (congestive heart failure) ([place])   . Chronic kidney disease    stage 3  . Depression with anxiety   . DM (diabetes mellitus) ([place])   . Esophageal reflux   . HTN  (hypertension)   . Hyperlipemia   . Hyperthyroidism   . INR (international normal ratio) abnormal   . MRSA (methicillin resistant staph aureus) culture positive   . Pneumonia   . PTSD (post-traumatic stress disorder)     Her Past Surgical History Is Significant For: Past Surgical History:  Procedure Laterality Date  . ABDOMINAL HYSTERECTOMY    . BREAST BIOPSY    . CATARACT EXTRACTION    . CESAREAN SECTION    . DENTAL SURGERY    . FOOT SURGERY    . HERNIA REPAIR    . KNEE SURGERY    . TONSILLECTOMY AND ADENOIDECTOMY      Her Family History Is Significant For: Family History  Problem Relation Age of Onset  . Alcoholism Father   . Arthritis Father   . Hypertension Father        sister, mother  . Diabetes Mellitus I Mother        sister  . Renal Disease Mother   . Stroke Mother   . Epilepsy Brother     Her Social History Is Significant For: Social History   Social History  . Marital status: Legally Separated    Spouse name: N/A  . Number of children: N/A  . Years of education: N/A   Social History Main Topics  . Smoking status: Former Smoker    Quit date: 12/15/2003  . Smokeless tobacco: Current User    Types: Snuff     Comment:  3 times per day  . Alcohol use None  . Drug use: Unknown  . Sexual activity: Not Asked   Other Topics Concern  . None   Social History Narrative  . None    Her Allergies Are:  Allergies  Allergen Reactions  . Codeine Other (See Comments)    Breaks out in a rash  :   Her Current Medications Are:  Outpatient Encounter Prescriptions as of 08/07/2017  Medication Sig  . albuterol (PROVENTIL HFA;VENTOLIN HFA) 108 (90 Base) MCG/ACT inhaler Inhale 2 puffs into the lungs every 6 (six) hours as needed for wheezing or shortness of breath.  . benzonatate (TESSALON) 100 MG capsule Take 1 capsule (100 mg total) by mouth every 8 (eight) hours.  . busPIRone (BUSPAR) 10 MG tablet Take 10 mg by mouth 2 (two) times daily.  Marland Kitchen estradiol  (ESTRACE) 0.1 MG/GM vaginal cream Place 1 Applicatorful vaginally 2 (two) times a week.  . ferrous gluconate (FERGON) 325 MG tablet Take 325 mg by mouth 2 (two) times daily.   . furosemide (LASIX) 40 MG tablet Take 40 mg by mouth daily.  Marland Kitchen levothyroxine (SYNTHROID, LEVOTHROID) 50 MCG tablet Take 50 mcg by mouth daily before breakfast.  . lisinopril-hydrochlorothiazide (PRINZIDE,ZESTORETIC) 20-12.5 MG per tablet Take 1 tablet by mouth daily.  Marland Kitchen neomycin-polymyxin-dexameth (MAXITROL) 0.1 % OINT Place 1 application into the right eye at bedtime.  Marland Kitchen omeprazole (PRILOSEC) 20 MG capsule Take 20 mg by mouth daily.  Marland Kitchen oxyCODONE-acetaminophen (PERCOCET/ROXICET) 5-325 MG tablet Take 1 tablet by mouth every 8 (eight) hours as needed for severe pain.  . pravastatin (PRAVACHOL) 40 MG tablet Take 40 mg by mouth daily.  . promethazine (PHENERGAN) 25 MG tablet Take 25-50 mg by mouth every 6 (six) hours as needed for nausea or vomiting.  . rivaroxaban (XARELTO) 20 MG TABS tablet Take 20 mg by mouth every evening.   . sertraline (ZOLOFT) 100 MG tablet Take 100 mg by mouth daily.   Marland Kitchen tiZANidine (ZANAFLEX) 4 MG tablet Take 4 mg by mouth every 6 (six) hours as needed for muscle spasms.  . [DISCONTINUED] azithromycin (ZITHROMAX Z-PAK) 250 MG tablet 2 po day one, then 1 daily x 4 days  . [DISCONTINUED] methimazole (TAPAZOLE) 10 MG tablet Take 10 mg by mouth daily.  . [DISCONTINUED] prednisoLONE acetate (PRED FORTE) 1 % ophthalmic suspension Place 1 drop into the right eye 4 (four) times daily.  . [DISCONTINUED] traMADol (ULTRAM) 50 MG tablet Take 50 mg by mouth 2 (two) times daily as needed for moderate pain.    No facility-administered encounter medications on file as of 08/07/2017.   :  Review of Systems:  Out of a complete 14 point review of systems, all are reviewed and negative with the exception of these symptoms as listed below: Review of Systems  Neurological:       Pt presents today to discuss her sleep.  Pt has never had a sleep study but does endorse snoring.  Epworth Sleepiness Scale 0= would never doze 1= slight chance of dozing 2= moderate chance of dozing 3= high chance of dozing  Sitting and reading: 3 Watching TV: 3 Sitting inactive in a public place (ex. Theater or meeting): 3 As a passenger in a car for an hour without a break: 3 Lying down to rest in the afternoon: 2 Sitting and talking to someone: 1 Sitting quietly after lunch (no alcohol): 3 In a car, while stopped in traffic: 0 Total: 18     Objective:  Neurological Exam  Physical Exam Physical Examination:   Vitals:   08/07/17 1321  BP: 102/70  Pulse: 76    General Examination: The patient is a very pleasant 61 y.o. female in no acute distress. She appears well-developed and well-nourished and well groomed.   HEENT: Normocephalic, atraumatic, pupils are equal, round and reactive to light and accommodation. Funduscopic exam is normal with sharp disc margins noted. Extraocular tracking is good without limitation to gaze excursion or nystagmus noted. Normal smooth pursuit is noted. Hearing is grossly intact. Tympanic membranes are clear bilaterally. Face is symmetric with normal facial animation and normal facial sensation. Speech is clear with no dysarthria noted. There is no hypophonia. There is no lip, neck/head, jaw or voice tremor. Neck is supple with full range of passive and active motion. There are no carotid bruits on auscultation. Oropharynx exam reveals: moderate mouth dryness, edentulous state and mild airway crowding, due to smaller airway entry. Mallampati is class II. Tongue protrudes centrally and palate elevates symmetrically. Tonsils are absent. Neck size is 15.5 inches.   Chest: Clear to auscultation without wheezing, rhonchi or crackles noted.  Heart: S1+S2+0, regular and normal without murmurs, rubs or gallops noted.   Abdomen: Soft, non-tender and non-distended with normal bowel sounds  appreciated on auscultation.  Extremities: There is no pitting edema in the distal lower extremities bilaterally. Pedal pulses are intact.  Skin: Warm and dry without trophic changes noted. There are no varicose veins.  Musculoskeletal: exam reveals no obvious joint deformities, tenderness or joint swelling or erythema, except L knee pain.   Neurologically:  Mental status: The patient is awake, alert and oriented in all 4 spheres. Her immediate and remote memory, attention, language skills and fund of knowledge are appropriate. There is no evidence of aphasia, agnosia, apraxia or anomia. Speech is clear with normal prosody and enunciation. Thought process is linear. Mood is normal and affect is normal.  Cranial nerves II - XII are as described above under HEENT exam. In addition: shoulder shrug is normal with equal shoulder height noted. Motor exam: Normal bulk, strength and tone is noted. There is no drift, tremor or rebound. Romberg is negative. Reflexes are 2+ throughout. Babinski: Toes are downgoing. Fine motor skills and coordination: intact with normal finger taps, normal hand movements, normal rapid alternating patting, normal foot taps and normal foot agility.  Cerebellar testing: No dysmetria or intention tremor on finger to nose testing. Heel to shin is unremarkable bilaterally. There is no truncal or gait ataxia.  Sensory exam: intact to light touch in the upper and lower extremities.  Gait, station and balance: She stands easily. No veering to one side is noted. No leaning to one side is noted. Posture is age-appropriate and stance is narrow based. Gait shows normal stride length and normal pace. No problems turning are noted. Tandem walk is not possible for her.   Assessment and Plan:  In summary, Milinda Sinyard is a very pleasant 61 y.o.-year old female with an underlying medical history of hypertension, hyperlipidemia, hyperthyroidism, reflux disease, diabetes, depression, anxiety,  anemia, A. fib, chronic kidney disease, congestive heart failure, asthma, and obesity, whose history and physical exam are concerning for obstructive sleep apnea (OSA). I had a long chat with the patient about my findings and the diagnosis of OSA, its prognosis and treatment options. We talked about medical treatments, surgical interventions and non-pharmacological approaches. I explained in particular the risks and ramifications of untreated moderate to severe OSA, especially with respect to developing  cardiovascular disease down the Road, including congestive heart failure, difficult to treat hypertension, cardiac arrhythmias, or stroke. Even type 2 diabetes has, in part, been linked to untreated OSA. Symptoms of untreated OSA include daytime sleepiness, memory problems, mood irritability and mood disorder such as depression and anxiety, lack of energy, as well as recurrent headaches, especially morning headaches. We talked about trying to maintain a healthy lifestyle in general, as well as the importance of weight control. I encouraged the patient to eat healthy, exercise daily and keep well hydrated, to keep a scheduled bedtime and wake time routine, to not skip any meals and eat healthy snacks in between meals. I advised the patient not to drive when feeling sleepy. I recommended the following at this time: sleep study with potential positive airway pressure titration. (We will score hypopneas at 4%).   I explained the sleep test procedure to the patient and also outlined possible surgical and non-surgical treatment options of OSA, including the use of a custom-made dental device (which would require a referral to a specialist dentist or oral surgeon), upper airway surgical options, such as pillar implants, radiofrequency surgery, tongue base surgery, and UPPP (which would involve a referral to an ENT surgeon). Rarely, jaw surgery such as mandibular advancement may be considered.  I also explained the CPAP  treatment option to the patient, who indicated that she would be willing to try CPAP if the need arises. I explained the importance of being compliant with PAP treatment, not only for insurance purposes but primarily to improve Her symptoms, and for the patient's long term health benefit, including to reduce Her cardiovascular risks. I answered all her questions today and the patient was in agreement. I would like to see her back after the sleep study is completed and encouraged her to call with any interim questions, concerns, problems or updates.   Thank you very much for allowing me to participate in the care of this nice patient. If I can be of any further assistance to you please do not hesitate to call me at [phone].  Sincerely,   Star Age, MD, PhD

## 2017-09-12 ENCOUNTER — Ambulatory Visit: Payer: Medicare Other | Admitting: Cardiology

## 2017-09-12 NOTE — Progress Notes (Deleted)
Electrophysiology Office Note   Date:  09/12/2017   ID:  Khalessi, Blough 1956/01/29, MRN [id number]  PCP:  Smothers, Andree Elk, NP   Primary Electrophysiologist:  Constance Haw, MD    No chief complaint on file.    History of Present Illness: Jennifer Foley is a 61 y.o. female who presents today for electrophysiology evaluation.   History of atrial fibrillation, HTN, HLD, DM.  She presents for follow-up of her atrial fibrillation. She wore a 48 hour monitor in December that showed no evidence of atrial fibrillation. She says that she occasionally has sharp chest pain that lasts a few seconds. She says the pain is worse when she is moving around. She can lie down the makes the pain feel better. Aside from that she has called EMS a few times in the past with symptoms of fatigue and shortness of breath that she has been told was anxiety. There is no evidence of atrial fibrillation per her report during those episodes. Otherwise, she feels well other than seeing a lot of doctors recently. She is planned to have a thyroid biopsy and is therefore not taking hers relative at this time.  Today, denies symptoms of palpitations, chest pain, shortness of breath, orthopnea, PND, lower extremity edema, claudication, dizziness, presyncope, syncope, bleeding, or neurologic sequela. The patient is tolerating medications without difficulties. ***    Past Medical History:  Diagnosis Date  . Anemia   . Atrial fibrillation ([place])   . Bilateral breast cysts   . CHF (congestive heart failure) ([place])   . Chronic kidney disease    stage 3  . Depression with anxiety   . DM (diabetes mellitus) ([place])   . Esophageal reflux   . HTN (hypertension)   . Hyperlipemia   . Hyperthyroidism   . INR (international normal ratio) abnormal   . MRSA (methicillin resistant staph aureus) culture positive   . Pneumonia   . PTSD (post-traumatic stress disorder)    Past Surgical History:  Procedure Laterality  Date  . ABDOMINAL HYSTERECTOMY    . BREAST BIOPSY    . CATARACT EXTRACTION    . CESAREAN SECTION    . DENTAL SURGERY    . FOOT SURGERY    . HERNIA REPAIR    . KNEE SURGERY    . TONSILLECTOMY AND ADENOIDECTOMY       Current Outpatient Medications  Medication Sig Dispense Refill  . albuterol (PROVENTIL HFA;VENTOLIN HFA) 108 (90 Base) MCG/ACT inhaler Inhale 2 puffs into the lungs every 6 (six) hours as needed for wheezing or shortness of breath.    . benzonatate (TESSALON) 100 MG capsule Take 1 capsule (100 mg total) by mouth every 8 (eight) hours. 21 capsule 0  . busPIRone (BUSPAR) 10 MG tablet Take 10 mg by mouth 2 (two) times daily.    Marland Kitchen estradiol (ESTRACE) 0.1 MG/GM vaginal cream Place 1 Applicatorful vaginally 2 (two) times a week.    . ferrous gluconate (FERGON) 325 MG tablet Take 325 mg by mouth 2 (two) times daily.     . furosemide (LASIX) 40 MG tablet Take 40 mg by mouth daily.    Marland Kitchen levothyroxine (SYNTHROID, LEVOTHROID) 50 MCG tablet Take 50 mcg by mouth daily before breakfast.    . lisinopril-hydrochlorothiazide (PRINZIDE,ZESTORETIC) 20-12.5 MG per tablet Take 1 tablet by mouth daily.    Marland Kitchen neomycin-polymyxin-dexameth (MAXITROL) 0.1 % OINT Place 1 application into the right eye at bedtime.    Marland Kitchen omeprazole (PRILOSEC) 20 MG capsule Take 20  mg by mouth daily.    Marland Kitchen oxyCODONE-acetaminophen (PERCOCET/ROXICET) 5-325 MG tablet Take 1 tablet by mouth every 8 (eight) hours as needed for severe pain.    . pravastatin (PRAVACHOL) 40 MG tablet Take 40 mg by mouth daily.    . promethazine (PHENERGAN) 25 MG tablet Take 25-50 mg by mouth every 6 (six) hours as needed for nausea or vomiting.    . rivaroxaban (XARELTO) 20 MG TABS tablet Take 20 mg by mouth every evening.     . sertraline (ZOLOFT) 100 MG tablet Take 100 mg by mouth daily.     Marland Kitchen tiZANidine (ZANAFLEX) 4 MG tablet Take 4 mg by mouth every 6 (six) hours as needed for muscle spasms.     No current facility-administered medications for  this visit.     Allergies:   Codeine   Social History:  The patient  reports that she quit smoking about 13 years ago. Her smokeless tobacco use includes snuff.   Family History:  The patient's family history includes Alcoholism in her father; Arthritis in her father; Diabetes Mellitus I in her mother; Epilepsy in her brother; Hypertension in her father; Renal Disease in her mother; Stroke in her mother.   ROS:  Please see the history of present illness.   Otherwise, review of systems is positive for ***.   All other systems are reviewed and negative.   PHYSICAL EXAM: VS:  There were no vitals taken for this visit. , BMI There is no height or weight on file to calculate BMI. GEN: Well nourished, well developed, in no acute distress  HEENT: normal  Neck: no JVD, carotid bruits, or masses Cardiac: ***RRR; no murmurs, rubs, or gallops,no edema  Respiratory:  clear to auscultation bilaterally, normal work of breathing GI: soft, nontender, nondistended, + BS MS: no deformity or atrophy  Skin: warm and dry Neuro:  Strength and sensation are intact Psych: euthymic mood, full affect  EKG:  EKG {ACTION; IS/IS PNT:[id number]} ordered today. Personal review of the ekg ordered *** shows ***   Recent Labs: No results found for requested labs within last 8760 hours.    Lipid Panel  No results found for: CHOL, TRIG, HDL, CHOLHDL, VLDL, LDLCALC, LDLDIRECT   Wt Readings from Last 3 Encounters:  08/07/17 215 lb (97.5 kg)  11/15/16 210 lb (95.3 kg)  05/09/16 207 lb 9.6 oz (94.2 kg)      Other studies Reviewed: Additional studies/ records that were reviewed today include: Epic notes   ASSESSMENT AND PLAN:  1.  Paroxysmal atrial fibrillation: On rivaroxaban. At this time, she is in sinus rhythm. She has recently worn a 48 hour monitor that did not show any evidence of atrial fibrillation. It is unclear whether or not she is having symptoms of her atrial fibrillation. Due to that, I have told  her to keep track of her symptomatology and let us know. We Seth Higginbotham see her back in November or December. She does feel, it seems, quite overwhelmed by the number of physicians she is seeing currently. We Maddon Horton therefore, with a further treatment plan at that time. I have told her to call back if she has any further questions or issues.***  This patients CHA2DS2-VASc Score and unadjusted Ischemic Stroke Rate (% per year) is equal to 2.2 % stroke rate/year from a score of 2  Above score calculated as 1 point each if present [CHF, HTN, DM, Vascular=MI/PAD/Aortic Plaque, Age if 65-74, or Female] Above score calculated as 2 points each if  present [Age > 75, or Stroke/TIA/TE]  2.  Hypertension:***  Current medicines are reviewed at length with the patient today.   The patient does not have concerns regarding her medicines.  The following changes were made today:  ***  Labs/ tests ordered today include:  No orders of the defined types were placed in this encounter.    Disposition:   FU with Shawntell Dixson *** months  Signed, Giavonni Cizek Meredith Leeds, MD  09/12/2017 8:57 AM     [facility] [address] [place] [address] [phone] (office) [phone] (fax)

## 2017-09-13 ENCOUNTER — Other Ambulatory Visit: Payer: Self-pay

## 2017-09-13 ENCOUNTER — Encounter (HOSPITAL_COMMUNITY): Payer: Self-pay | Admitting: Emergency Medicine

## 2017-09-13 DIAGNOSIS — N183 Chronic kidney disease, stage 3 (moderate): Secondary | ICD-10-CM | POA: Diagnosis not present

## 2017-09-13 DIAGNOSIS — Z87891 Personal history of nicotine dependence: Secondary | ICD-10-CM | POA: Diagnosis not present

## 2017-09-13 DIAGNOSIS — Z7901 Long term (current) use of anticoagulants: Secondary | ICD-10-CM | POA: Insufficient documentation

## 2017-09-13 DIAGNOSIS — I509 Heart failure, unspecified: Secondary | ICD-10-CM | POA: Insufficient documentation

## 2017-09-13 DIAGNOSIS — R509 Fever, unspecified: Secondary | ICD-10-CM | POA: Diagnosis not present

## 2017-09-13 DIAGNOSIS — E119 Type 2 diabetes mellitus without complications: Secondary | ICD-10-CM | POA: Diagnosis not present

## 2017-09-13 DIAGNOSIS — I130 Hypertensive heart and chronic kidney disease with heart failure and stage 1 through stage 4 chronic kidney disease, or unspecified chronic kidney disease: Secondary | ICD-10-CM | POA: Insufficient documentation

## 2017-09-13 DIAGNOSIS — D649 Anemia, unspecified: Secondary | ICD-10-CM | POA: Diagnosis not present

## 2017-09-13 DIAGNOSIS — I4891 Unspecified atrial fibrillation: Secondary | ICD-10-CM | POA: Insufficient documentation

## 2017-09-13 DIAGNOSIS — H1132 Conjunctival hemorrhage, left eye: Secondary | ICD-10-CM | POA: Insufficient documentation

## 2017-09-13 DIAGNOSIS — M7918 Myalgia, other site: Secondary | ICD-10-CM | POA: Diagnosis not present

## 2017-09-13 DIAGNOSIS — Z79899 Other long term (current) drug therapy: Secondary | ICD-10-CM | POA: Diagnosis not present

## 2017-09-13 DIAGNOSIS — J029 Acute pharyngitis, unspecified: Secondary | ICD-10-CM | POA: Diagnosis present

## 2017-09-13 LAB — BASIC METABOLIC PANEL
ANION GAP: 10 (ref 5–15)
BUN: 29 mg/dL — AB (ref 6–20)
CALCIUM: 8.5 mg/dL — AB (ref 8.9–10.3)
CO2: 27 mmol/L (ref 22–32)
Chloride: 100 mmol/L — ABNORMAL LOW (ref 101–111)
Creatinine, Ser: 1.81 mg/dL — ABNORMAL HIGH (ref 0.44–1.00)
GFR calc Af Amer: 34 mL/min — ABNORMAL LOW (ref >60)
GFR calc non Af Amer: 29 mL/min — ABNORMAL LOW (ref >60)
GLUCOSE: 179 mg/dL — AB (ref 65–99)
Potassium: 3 mmol/L — ABNORMAL LOW (ref 3.5–5.1)
Sodium: 137 mmol/L (ref 135–145)

## 2017-09-13 LAB — CBC WITH DIFFERENTIAL/PLATELET
Basophils Absolute: 0 10*3/uL (ref 0.0–0.1)
Basophils Relative: 0 %
EOS PCT: 1 %
Eosinophils Absolute: 0.1 10*3/uL (ref 0.0–0.7)
HCT: 29.1 % — ABNORMAL LOW (ref 36.0–46.0)
Hemoglobin: 9.3 g/dL — ABNORMAL LOW (ref 12.0–15.0)
LYMPHS ABS: 1.8 10*3/uL (ref 0.7–4.0)
Lymphocytes Relative: 12 %
MCH: 27.6 pg (ref 26.0–34.0)
MCHC: 32 g/dL (ref 30.0–36.0)
MCV: 86.4 fL (ref 78.0–100.0)
MONO ABS: 0.4 10*3/uL (ref 0.1–1.0)
MONOS PCT: 3 %
NEUTROS ABS: 13.2 10*3/uL — AB (ref 1.7–7.7)
Neutrophils Relative %: 84 %
PLATELETS: 255 10*3/uL (ref 150–400)
RBC: 3.37 MIL/uL — ABNORMAL LOW (ref 3.87–5.11)
RDW: 14.1 % (ref 11.5–15.5)
WBC: 15.5 10*3/uL — ABNORMAL HIGH (ref 4.0–10.5)

## 2017-09-13 LAB — I-STAT CG4 LACTIC ACID, ED: Lactic Acid, Venous: 1.09 mmol/L (ref 0.5–1.9)

## 2017-09-13 NOTE — ED Triage Notes (Signed)
Pt starting having a cold with sore throat and fever at home since yesterday, pt just got some tylenol at home pta to ED.

## 2017-09-14 ENCOUNTER — Emergency Department (HOSPITAL_COMMUNITY)
Admission: EM | Admit: 2017-09-14 | Discharge: 2017-09-14 | Disposition: A | Discharge status: Home / Self Care | Payer: Medicare Other | Attending: Emergency Medicine | Admitting: Emergency Medicine

## 2017-09-14 DIAGNOSIS — J029 Acute pharyngitis, unspecified: Secondary | ICD-10-CM

## 2017-09-14 DIAGNOSIS — H1132 Conjunctival hemorrhage, left eye: Secondary | ICD-10-CM

## 2017-09-14 NOTE — ED Provider Notes (Signed)
[place] [facility] EMERGENCY DEPARTMENT Provider Note   CSN: [id number] Arrival date & time: 09/13/17  2313     History   Chief Complaint Chief Complaint  Patient presents with  . Influenza    HPI Jennifer Foley is a 61 y.o. female.  Patient presents for evaluation of a sore throat x 2-3 days. She reports fever at home with Tmax 100.4. No significant nasal congestion or cough. No nausea or vomiting. She is able to swallow liquids and solids without difficulty. No sick contacts. She reports body aches for several days that started prior to the sore throat, but this is resolved now. She was also concerned for bleeding in her right eye. No eye pain or visual change.    The history is provided by the patient. No language interpreter was used.  Influenza  Presenting symptoms: fever (Low grade.) and sore throat   Presenting symptoms: no cough, no myalgias, no nausea, no shortness of breath and no vomiting   Associated symptoms: chills     Past Medical History:  Diagnosis Date  . Anemia   . Atrial fibrillation ([place])   . Bilateral breast cysts   . CHF (congestive heart failure) ([place])   . Chronic kidney disease    stage 3  . Depression with anxiety   . DM (diabetes mellitus) ([place])   . Esophageal reflux   . HTN (hypertension)   . Hyperlipemia   . Hyperthyroidism   . INR (international normal ratio) abnormal   . MRSA (methicillin resistant staph aureus) culture positive   . Pneumonia   . PTSD (post-traumatic stress disorder)     There are no active problems to display for this patient.   Past Surgical History:  Procedure Laterality Date  . ABDOMINAL HYSTERECTOMY    . BREAST BIOPSY    . CATARACT EXTRACTION    . CESAREAN SECTION    . DENTAL SURGERY    . FOOT SURGERY    . HERNIA REPAIR    . KNEE SURGERY    . TONSILLECTOMY AND ADENOIDECTOMY      OB History    No data available       Home Medications    Prior to Admission medications   Medication  Sig Start Date End Date Taking? Authorizing Provider  albuterol (PROVENTIL HFA;VENTOLIN HFA) 108 (90 Base) MCG/ACT inhaler Inhale 2 puffs into the lungs every 6 (six) hours as needed for wheezing or shortness of breath.    [provider]  benzonatate (TESSALON) 100 MG capsule Take 1 capsule (100 mg total) by mouth every 8 (eight) hours. 11/15/16   Domenic Moras, PA-C  busPIRone (BUSPAR) 10 MG tablet Take 10 mg by mouth 2 (two) times daily.    [provider]  estradiol (ESTRACE) 0.1 MG/GM vaginal cream Place 1 Applicatorful vaginally 2 (two) times a week.    [provider]  ferrous gluconate (FERGON) 325 MG tablet Take 325 mg by mouth 2 (two) times daily.     [provider]  furosemide (LASIX) 40 MG tablet Take 40 mg by mouth daily.    [provider]  levothyroxine (SYNTHROID, LEVOTHROID) 50 MCG tablet Take 50 mcg by mouth daily before breakfast.    [provider]  lisinopril-hydrochlorothiazide (PRINZIDE,ZESTORETIC) 20-12.5 MG per tablet Take 1 tablet by mouth daily.    [provider]  neomycin-polymyxin-dexameth (MAXITROL) 0.1 % OINT Place 1 application into the right eye at bedtime.    [provider]  omeprazole (PRILOSEC) 20  MG capsule Take 20 mg by mouth daily.    [provider]  oxyCODONE-acetaminophen (PERCOCET/ROXICET) 5-325 MG tablet Take 1 tablet by mouth every 8 (eight) hours as needed for severe pain.    [provider]  pravastatin (PRAVACHOL) 40 MG tablet Take 40 mg by mouth daily.    [provider]  promethazine (PHENERGAN) 25 MG tablet Take 25-50 mg by mouth every 6 (six) hours as needed for nausea or vomiting.    [provider]  rivaroxaban (XARELTO) 20 MG TABS tablet Take 20 mg by mouth every evening.     [provider]  sertraline (ZOLOFT) 100 MG tablet Take 100 mg by mouth daily.     [provider]  tiZANidine (ZANAFLEX) 4 MG tablet Take 4 mg by  mouth every 6 (six) hours as needed for muscle spasms.    [provider]    Family History Family History  Problem Relation Age of Onset  . Alcoholism Father   . Arthritis Father   . Hypertension Father        sister, mother  . Diabetes Mellitus I Mother        sister  . Renal Disease Mother   . Stroke Mother   . Epilepsy Brother     Social History Social History   Tobacco Use  . Smoking status: Former Smoker    Last attempt to quit: 12/15/2003    Years since quitting: 13.7  . Smokeless tobacco: Current User    Types: Snuff  . Tobacco comment: 3 times per day  Substance Use Topics  . Alcohol use: Not on file  . Drug use: Not on file     Allergies   Codeine   Review of Systems Review of Systems  Constitutional: Positive for chills and fever (Low grade.). Negative for appetite change.  HENT: Positive for sore throat. Negative for trouble swallowing.   Eyes:       See HPI.  Respiratory: Negative.  Negative for cough and shortness of breath.   Cardiovascular: Negative.   Gastrointestinal: Negative.  Negative for nausea and vomiting.  Musculoskeletal: Negative.  Negative for myalgias.  Skin: Negative.  Negative for rash.  Neurological: Negative.      Physical Exam Updated Vital Signs BP 100/67   Pulse 74   Temp 98.3 F (36.8 C) (Oral)   Resp 16   Ht 5\' 3"  (1.6 m)   Wt 97.5 kg (215 lb)   SpO2 98%   BMI 38.09 kg/m   Physical Exam  Constitutional: She is oriented to person, place, and time. She appears well-developed and well-nourished.  HENT:  Head: Normocephalic.  Nose: Nose normal.  Mouth/Throat: Oropharynx is clear and moist.  Eyes:  Left medial subconjunctival hemorrhage.  Neck: Normal range of motion. Neck supple.  Cardiovascular: Normal rate and regular rhythm.  Pulmonary/Chest: Effort normal and breath sounds normal. She has no wheezes. She has no rales.  Abdominal: Soft. Bowel sounds are normal. There is no tenderness. There is no  rebound and no guarding.  Musculoskeletal: Normal range of motion.  Lymphadenopathy:    She has no cervical adenopathy.  Neurological: She is alert and oriented to person, place, and time.  Skin: Skin is warm and dry. No rash noted.  Psychiatric: She has a normal mood and affect.     ED Treatments / Results  Labs (all labs ordered are listed, but only abnormal results are displayed) Labs Reviewed  CBC WITH DIFFERENTIAL/PLATELET - Abnormal; Notable for  the following components:      Result Value   WBC 15.5 (*)    RBC 3.37 (*)    Hemoglobin 9.3 (*)    HCT 29.1 (*)    Neutro Abs 13.2 (*)    All other components within normal limits  BASIC METABOLIC PANEL - Abnormal; Notable for the following components:   Potassium 3.0 (*)    Chloride 100 (*)    Glucose, Bld 179 (*)    BUN 29 (*)    Creatinine, Ser 1.81 (*)    Calcium 8.5 (*)    GFR calc non Af Amer 29 (*)    GFR calc Af Amer 34 (*)    All other components within normal limits  I-STAT CG4 LACTIC ACID, ED    EKG  EKG Interpretation None       Radiology No results found.  Procedures Procedures (including critical care time)  Medications Ordered in ED Medications - No data to display   Initial Impression / Assessment and Plan / ED Course  I have reviewed the triage vital signs and the nursing notes.  Pertinent labs & imaging results that were available during my care of the patient were reviewed by me and considered in my medical decision making (see chart for details).     Patient with sore throat c/w viral illness. Supportive measures discussed.   She has a left subconjunctival hemorrhage that is uncomplicated.   Final Clinical Impressions(s) / ED Diagnoses   Final diagnoses:  None   1. Pharyngitis 2. Left subconjunctival hemorrhage.   ED Discharge Orders    None       Charlann Lange, Hershal Coria 67/34/19 3790    Delora Fuel, MD 24/09/73 [phone]

## 2017-10-09 ENCOUNTER — Ambulatory Visit (INDEPENDENT_AMBULATORY_CARE_PROVIDER_SITE_OTHER): Payer: Medicare Other | Admitting: Neurology

## 2017-10-09 DIAGNOSIS — R351 Nocturia: Secondary | ICD-10-CM

## 2017-10-09 DIAGNOSIS — R51 Headache: Secondary | ICD-10-CM

## 2017-10-09 DIAGNOSIS — G4733 Obstructive sleep apnea (adult) (pediatric): Secondary | ICD-10-CM

## 2017-10-09 DIAGNOSIS — I480 Paroxysmal atrial fibrillation: Secondary | ICD-10-CM

## 2017-10-09 DIAGNOSIS — E669 Obesity, unspecified: Secondary | ICD-10-CM

## 2017-10-09 DIAGNOSIS — R400 Somnolence: Secondary | ICD-10-CM

## 2017-10-09 DIAGNOSIS — R0683 Snoring: Secondary | ICD-10-CM

## 2017-10-09 DIAGNOSIS — I509 Heart failure, unspecified: Secondary | ICD-10-CM

## 2017-10-09 DIAGNOSIS — G4720 Circadian rhythm sleep disorder, unspecified type: Secondary | ICD-10-CM

## 2017-10-09 DIAGNOSIS — R519 Headache, unspecified: Secondary | ICD-10-CM

## 2017-10-11 ENCOUNTER — Other Ambulatory Visit: Payer: Self-pay | Admitting: Neurology

## 2017-10-11 ENCOUNTER — Ambulatory Visit (INDEPENDENT_AMBULATORY_CARE_PROVIDER_SITE_OTHER): Payer: Medicare Other | Admitting: Cardiology

## 2017-10-11 ENCOUNTER — Encounter: Payer: Self-pay | Admitting: Cardiology

## 2017-10-11 VITALS — BP 110/62 | HR 72 | Ht 62.0 in | Wt 211.4 lb

## 2017-10-11 DIAGNOSIS — G4733 Obstructive sleep apnea (adult) (pediatric): Secondary | ICD-10-CM

## 2017-10-11 DIAGNOSIS — I10 Essential (primary) hypertension: Secondary | ICD-10-CM | POA: Diagnosis not present

## 2017-10-11 DIAGNOSIS — E785 Hyperlipidemia, unspecified: Secondary | ICD-10-CM | POA: Diagnosis not present

## 2017-10-11 DIAGNOSIS — I480 Paroxysmal atrial fibrillation: Secondary | ICD-10-CM

## 2017-10-11 NOTE — Progress Notes (Signed)
Electrophysiology Office Note   Date:  10/11/2017   ID:  Jennifer, Foley 02-18-1956, MRN [id number]  PCP:  Bartholome Bill, MD   Primary Electrophysiologist:  Constance Haw, MD    Chief Complaint  Patient presents with  . Follow-up    PAF     History of Present Illness: Jennifer Foley is a 61 y.o. female who presents today for electrophysiology evaluation.   History of atrial fibrillation, HTN, HLD, DM.  She presents for follow-up of her atrial fibrillation. She wore a 48 hour monitor in December that showed no evidence of atrial fibrillation.   Today, denies symptoms of palpitations, chest pain, shortness of breath, orthopnea, PND, lower extremity edema, claudication, dizziness, presyncope, syncope, bleeding, or neurologic sequela. The patient is tolerating medications without difficulties.  She has been feeling well.  She has not noted episodes of atrial fibrillation that has caused her symptoms.  She has not had palpitations, weakness, or shortness of breath.   Past Medical History:  Diagnosis Date  . Anemia   . Atrial fibrillation ([place])   . Bilateral breast cysts   . CHF (congestive heart failure) ([place])   . Chronic kidney disease    stage 3  . Depression with anxiety   . DM (diabetes mellitus) ([place])   . Esophageal reflux   . HTN (hypertension)   . Hyperlipemia   . Hyperthyroidism   . INR (international normal ratio) abnormal   . MRSA (methicillin resistant staph aureus) culture positive   . Pneumonia   . PTSD (post-traumatic stress disorder)    Past Surgical History:  Procedure Laterality Date  . ABDOMINAL HYSTERECTOMY    . BREAST BIOPSY    . CATARACT EXTRACTION    . CESAREAN SECTION    . DENTAL SURGERY    . FOOT SURGERY    . HERNIA REPAIR    . KNEE SURGERY    . TONSILLECTOMY AND ADENOIDECTOMY       Current Outpatient Medications  Medication Sig Dispense Refill  . benzonatate (TESSALON) 100 MG capsule Take 1 capsule (100 mg total) by  mouth every 8 (eight) hours. 21 capsule 0  . busPIRone (BUSPAR) 10 MG tablet Take 10 mg by mouth 2 (two) times daily.    Marland Kitchen estradiol (ESTRACE) 0.1 MG/GM vaginal cream Place 1 Applicatorful vaginally 2 (two) times a week.    . ferrous gluconate (FERGON) 325 MG tablet Take 325 mg by mouth 2 (two) times daily.     . fluticasone (FLOVENT HFA) 110 MCG/ACT inhaler Inhale into the lungs. Use as directed    . folic acid (FOLVITE) 1 MG tablet Take 1 mg by mouth daily.    . furosemide (LASIX) 40 MG tablet Take 40 mg by mouth daily.    Marland Kitchen levothyroxine (SYNTHROID, LEVOTHROID) 50 MCG tablet Take 50 mcg by mouth daily before breakfast.    . lisinopril-hydrochlorothiazide (PRINZIDE,ZESTORETIC) 20-12.5 MG per tablet Take 1 tablet by mouth daily.    . methotrexate (RHEUMATREX) 2.5 MG tablet Take 4 tablets by mouth once a week.    . neomycin-polymyxin-dexameth (MAXITROL) 0.1 % OINT Place 1 application into the right eye at bedtime.    Marland Kitchen omeprazole (PRILOSEC) 20 MG capsule Take 20 mg by mouth daily.    Marland Kitchen oxyCODONE-acetaminophen (PERCOCET/ROXICET) 5-325 MG tablet Take 1 tablet by mouth every 8 (eight) hours as needed for severe pain.    . pravastatin (PRAVACHOL) 40 MG tablet Take 40 mg by mouth daily.    . promethazine (PHENERGAN)  25 MG tablet Take 25-50 mg by mouth every 6 (six) hours as needed for nausea or vomiting.    . rivaroxaban (XARELTO) 20 MG TABS tablet Take 20 mg by mouth every evening.     . sertraline (ZOLOFT) 100 MG tablet Take 100 mg by mouth daily.     Marland Kitchen tiZANidine (ZANAFLEX) 4 MG tablet Take 4 mg by mouth every 6 (six) hours as needed for muscle spasms.     No current facility-administered medications for this visit.     Allergies:   Codeine   Social History:  The patient  reports that she quit smoking about 13 years ago. Her smokeless tobacco use includes snuff.   Family History:  The patient's family history includes Alcoholism in her father; Arthritis in her father; Diabetes Mellitus I in  her mother; Epilepsy in her brother; Hypertension in her father; Renal Disease in her mother; Stroke in her mother.   ROS:  Please see the history of present illness.   Otherwise, review of systems is positive for [place], visual changes, cough, depression, anxiety, balance problems, muscle pain.   All other systems are reviewed and negative.   PHYSICAL EXAM: VS:  BP 110/62   Pulse 72   Ht 5\' 2"  (1.575 m)   Wt 211 lb 6.4 oz (95.9 kg)   BMI 38.67 kg/m  , BMI Body mass index is 38.67 kg/m. GEN: Well nourished, well developed, in no acute distress  HEENT: normal  Neck: no JVD, carotid bruits, or masses Cardiac: RRR; no murmurs, rubs, or gallops,no edema  Respiratory:  clear to auscultation bilaterally, normal work of breathing GI: soft, nontender, nondistended, + BS MS: no deformity or atrophy  Skin: warm and dry Neuro:  Strength and sensation are intact Psych: euthymic mood, full affect  EKG:  EKG is ordered today. Personal review of the ekg ordered shows sinus rhythm   Recent Labs: 09/13/2017: BUN 29; Creatinine, Ser 1.81; Hemoglobin 9.3; Platelets 255; Potassium 3.0; Sodium 137    Lipid Panel  No results found for: CHOL, TRIG, HDL, CHOLHDL, VLDL, LDLCALC, LDLDIRECT   Wt Readings from Last 3 Encounters:  10/11/17 211 lb 6.4 oz (95.9 kg)  09/13/17 215 lb (97.5 kg)  08/07/17 215 lb (97.5 kg)      Other studies Reviewed: Additional studies/ records that were reviewed today include: Holter 05/31/17 - personally reviewed Sinus rhythm  Rates 65 to 129 bpm  Average HR 88 bpm  Occasional PVC  Rare PAC     ASSESSMENT AND PLAN:  1.  Paroxysmal atrial fibrillation: Al on Xarelto so.  She is currently in sinus rhythm.  She has had minimal amounts of symptoms from atrial fibrillation.  In sinus rhythm today.  No changes at this time.  This patients CHA2DS2-VASc Score and unadjusted Ischemic Stroke Rate (% per year) is equal to 3.2 % stroke rate/year from a score of 3  Above score  calculated as 1 point each if present [CHF, HTN, DM, Vascular=MI/PAD/Aortic Plaque, Age if 65-74, or Female] Above score calculated as 2 points each if present [Age > 75, or Stroke/TIA/TE]  2.  Hypertension: Currently well controlled.  Continue current management.  3.  Hyperlipidemia: Continue pravastatin.  Current medicines are reviewed at length with the patient today.   The patient does not have concerns regarding her medicines.  The following changes were made today: None  Labs/ tests ordered today include:  Orders Placed This Encounter  Procedures  . EKG 12-Lead  Disposition:   FU with Yanky Vanderburg 6 months  Signed, Konor Noren Meredith Leeds, MD  10/11/2017 9:12 AM     [facility] [address] [place] [address] [phone] (office) [phone] (fax)

## 2017-10-11 NOTE — Progress Notes (Signed)
Patient referred by Augusto Gamble, NP, seen by me on 08/07/17, diagnostic PSG on 10/09/17.     Please call and notify the patient that the recent sleep study demonstrates mild/borderline obstructive sleep apnea with a total AHI of 5.8/hour and O2 nadir of 87%. Of note, the absence of supine sleep and REM sleep during this study likely underestimates her AHI and O2 nadir. OSA is overall mild, but worth treating to see if she feels better after treatment. To that end I recommend treatment for this in the form of autoPAP, which means, that we don't have to bring her back for a second sleep study with CPAP, but will let her try an autoPAP machine at home, through a DME company (of her choice, or as per insurance requirement). The DME representative will educate him on how to use the machine, how to put the mask on, etc. I have placed an order in the chart. Please send referral, talk to patient, send report to PCP and referring MD. We will need a FU in sleep clinic for 10 weeks post-PAP set up, please arrange that as well. Thanks,   Star Age, MD, PhD [facility])

## 2017-10-11 NOTE — Procedures (Signed)
PATIENT'S NAME:  Jennifer Foley, Jennifer Foley DOB:      April 26, 1956      MR#:    [id number]     DATE OF RECORDING: 10/09/2017 REFERRING M.D.:  Augusto Gamble, NP Study Performed: Baseline Polysomnogram HISTORY: 61 year old right-handed woman with a history of hypertension, hyperlipidemia, hyperthyroidism, reflux disease, diabetes, depression, anxiety, anemia, A. fib, chronic kidney disease, congestive heart failure, asthma, and obesity, who reports snoring and excessive daytime somnolence. The patient endorsed the Epworth Sleepiness Scale at 18 points. The patient's weight 215 pounds with a height of 62 (inches), resulting in a BMI of 39.8 kg/m2. The patient's neck circumference measured 15.5 inches.  CURRENT MEDICATIONS: Proventil inhaler, Tessalon, Buspar, Estrace, Fergon, Lasix, Synthroid, Prinzide, Maxitrol, Prilosec, Percocet, Pravachol, Phenergan, Xarelto, Zanaflex, Zoloft.    PROCEDURE:  This is a multichannel digital polysomnogram utilizing the Somnostar 11.2 system.  Electrodes and sensors were applied and monitored per AASM Specifications.   EEG, EOG, Chin and Limb EMG, were sampled at 200 Hz.  ECG, Snore and Nasal Pressure, Thermal Airflow, Respiratory Effort, CPAP Flow and Pressure, Oximetry was sampled at 50 Hz. Digital video and audio were recorded.      BASELINE STUDY  Lights Out was at 22:35 and Lights On at 05:00. Total recording time (TRT) was 385.5 minutes, with a total sleep time (TST) of  237.5 minutes. The patient's sleep latency was 30.5 minutes, which is delayed. REM sleep was absent. The sleep efficiency was 61.6%.     SLEEP ARCHITECTURE: WASO (Wake after sleep onset) was 117 minutes with longer periods of wakefulness. There were 10 minutes in Stage N1, 184.5 minutes Stage N2, 43 minutes Stage N3 and 0 minutes in Stage REM. The percentage of Stage N1 was 4.2%, Stage N2 was 77.7%, which is increased, Stage N3 was 18.1%, which is normal, and Stage R (REM sleep) was absent. The arousals  were noted as: 62 were spontaneous, 0 were associated with PLMs, 22 were associated with respiratory events.  Audio and video analysis did not show any abnormal or unusual movements, behaviors, phonations or vocalizations. The patient took no bathroom breaks. Mild snoring was noted. The EKG was in keeping with normal sinus rhythm (NSR).  RESPIRATORY ANALYSIS:  There were a total of 23 respiratory events:  7 obstructive apneas, 0 central apneas and 0 mixed apneas with a total of 7 apneas and an apnea index (AI) of 1.8 /hour. There were 16 hypopneas with a hypopnea index of 4. /hour. The patient also had 0 respiratory event related arousals (RERAs).      The total APNEA/HYPOPNEA INDEX (AHI) was 5.8/hour and the total RESPIRATORY DISTURBANCE INDEX was 5.8 /hour.  0 events occurred in REM sleep and 32 events in NREM. The REM AHI was 0 /hour, versus a non-REM AHI of 5.8. The patient spent 0 minutes of total sleep time in the supine position and 238 minutes in non-supine.. The supine AHI was 0.0 versus a non-supine AHI of 5.8.  OXYGEN SATURATION & C02: The Wake baseline 02 saturation was 98%, with the lowest being 87%. Time spent below 89% saturation equaled 4 minutes.  PERIODIC LIMB MOVEMENTS: The patient had a total of 0 Periodic Limb Movements.  The Periodic Limb Movement (PLM) index was 0 and the PLM Arousal index was 0/hour.  Post-study, the patient indicated that sleep was the same as usual.   IMPRESSION:  1. Obstructive Sleep Apnea (OSA) 2. Dysfunctions associated with sleep stages or arousal from sleep  RECOMMENDATIONS:  1. This study demonstrates  mild/borderline obstructive sleep apnea with a total AHI of 5.8/hour and O2 nadir of 87%. Of note, the absence of supine sleep and REM sleep during this study likely underestimates her AHI and O2 nadir. Due to the overall mild nature of the patient's sleep apnea, there are several therapeutic avenues available. Options include avoidance of supine  sleep position along with weight loss, upper airway or jaw surgery in selected patients or the use of an oral appliance in certain patients. ENT evaluation and/or consultation with a maxillofacial surgeon or dentist may be feasible in some instances. Therapy with positive airway pressure can be considered, this can be achieved in the form of a full night CPAP titration versus trial of AutoPAP at home. Options will be discussed with the patient. AutoPAP will be recommended, due to her medical history and sleep related complaints.  2. Please note that untreated obstructive sleep apnea carries additional perioperative morbidity. Patients with significant obstructive sleep apnea should receive perioperative PAP therapy and the surgeons and particularly the anesthesiologist should be informed of the diagnosis and the severity of the sleep disordered breathing. 3. This study shows sleep fragmentation and abnormal sleep stage percentages; these are nonspecific findings and per se do not signify an intrinsic sleep disorder or a cause for the patient's sleep-related symptoms. Causes include (but are not limited to) the first night effect of the sleep study, circadian rhythm disturbances, medication effect or an underlying mood disorder or medical problem.  4. The patient should be cautioned not to drive, work at heights, or operate dangerous or heavy equipment when tired or sleepy. Review and reiteration of good sleep hygiene measures should be pursued with any patient. 5. The patient will be seen in follow-up by Dr. Rexene Alberts at [facility] for discussion of the test results and further management strategies. The referring provider will be notified of the test results.  I certify that I have reviewed the entire raw data recording prior to the issuance of this report in accordance with the Standards of Accreditation of the American Academy of Sleep Medicine (AASM)  Star Age, MD, PhD Diplomat, American Board of Psychiatry and  Neurology (Neurology and Sleep Medicine)

## 2017-10-17 ENCOUNTER — Telehealth: Payer: Self-pay

## 2017-10-17 NOTE — Telephone Encounter (Signed)
-----   Message from Star Age, MD sent at 10/11/2017  6:26 PM EST ----- Patient referred by Augusto Gamble, NP, seen by me on 08/07/17, diagnostic PSG on 10/09/17.     Please call and notify the patient that the recent sleep study demonstrates mild/borderline obstructive sleep apnea with a total AHI of 5.8/hour and O2 nadir of 87%. Of note, the absence of supine sleep and REM sleep during this study likely underestimates her AHI and O2 nadir. OSA is overall mild, but worth treating to see if she feels better after treatment. To that end I recommend treatment for this in the form of autoPAP, which means, that we don't have to bring her back for a second sleep study with CPAP, but will let her try an autoPAP machine at home, through a DME company (of her choice, or as per insurance requirement). The DME representative will educate him on how to use the machine, how to put the mask on, etc. I have placed an order in the chart. Please send referral, talk to patient, send report to PCP and referring MD. We will need a FU in sleep clinic for 10 weeks post-PAP set up, please arrange that as well. Thanks,   Star Age, MD, PhD [facility])

## 2017-10-17 NOTE — Telephone Encounter (Signed)
I called pt. I advised pt that Dr. Rexene Alberts reviewed their sleep study results and found that pt has mild/borderline osa, but in the absence of supine and REM sleep, her AHI and O2 nadir may be underestimated. Dr. Rexene Alberts recommends that pt start an auto pap at home. I reviewed PAP compliance expectations with the pt. Pt is agreeable to starting an auto-PAP. I advised pt that an order will be sent to a DME, [employer], and [employer] will call the pt within about one week after they file with the pt's insurance. [employer] will show the pt how to use the machine, fit for masks, and troubleshoot the auto-PAP if needed. A follow up appt was made for insurance purposes with Dr. Rexene Alberts  on 12/25/17 at 2:00pm. Pt verbalized understanding to arrive 15 minutes early and bring their auto-PAP. A letter with all of this information in it will be mailed to the pt as a reminder. I verified with the pt that the address we have on file is correct. Pt verbalized understanding of results. Pt had no questions at this time but was encouraged to call back if questions arise.

## 2017-10-18 ENCOUNTER — Telehealth: Payer: Self-pay | Admitting: *Deleted

## 2017-10-18 DIAGNOSIS — E876 Hypokalemia: Secondary | ICD-10-CM

## 2017-10-18 DIAGNOSIS — R718 Other abnormality of red blood cells: Secondary | ICD-10-CM

## 2017-10-18 DIAGNOSIS — R7989 Other specified abnormal findings of blood chemistry: Secondary | ICD-10-CM

## 2017-10-18 DIAGNOSIS — R710 Precipitous drop in hematocrit: Secondary | ICD-10-CM

## 2017-10-18 NOTE — Telephone Encounter (Signed)
Spoke to patient about need for follow up blood work.   Explained that blood work from hospitalization at the end of November was abnormal.  Inquired if she has had any blood work since d/c and she wasn't sure.  Called PCP who has not drawn any. Called pt back and scheduled her for 10/24/17. Patient verbalized understanding and agreeable to plan.

## 2017-10-24 ENCOUNTER — Other Ambulatory Visit: Payer: Medicare Other

## 2017-10-26 ENCOUNTER — Other Ambulatory Visit: Payer: Medicare Other | Admitting: *Deleted

## 2017-10-26 DIAGNOSIS — R7989 Other specified abnormal findings of blood chemistry: Secondary | ICD-10-CM

## 2017-10-26 DIAGNOSIS — R718 Other abnormality of red blood cells: Secondary | ICD-10-CM

## 2017-10-26 DIAGNOSIS — E876 Hypokalemia: Secondary | ICD-10-CM

## 2017-10-26 DIAGNOSIS — R710 Precipitous drop in hematocrit: Secondary | ICD-10-CM

## 2017-10-27 LAB — BASIC METABOLIC PANEL
BUN/Creatinine Ratio: 13 (ref 12–28)
BUN: 19 mg/dL (ref 8–27)
CALCIUM: 9.4 mg/dL (ref 8.7–10.3)
CO2: 24 mmol/L (ref 20–29)
CREATININE: 1.49 mg/dL — AB (ref 0.57–1.00)
Chloride: 102 mmol/L (ref 96–106)
GFR calc Af Amer: 43 mL/min/{1.73_m2} — ABNORMAL LOW (ref >59)
GFR, EST NON AFRICAN AMERICAN: 38 mL/min/{1.73_m2} — AB (ref >59)
GLUCOSE: 108 mg/dL — AB (ref 65–99)
POTASSIUM: 3.4 mmol/L — AB (ref 3.5–5.2)
SODIUM: 145 mmol/L — AB (ref 134–144)

## 2017-10-27 LAB — CBC WITH DIFFERENTIAL/PLATELET
BASOS: 0 %
Basophils Absolute: 0 10*3/uL (ref 0.0–0.2)
EOS (ABSOLUTE): 0.1 10*3/uL (ref 0.0–0.4)
Eos: 1 %
Hematocrit: 30.8 % — ABNORMAL LOW (ref 34.0–46.6)
Hemoglobin: 9.9 g/dL — ABNORMAL LOW (ref 11.1–15.9)
IMMATURE GRANS (ABS): 0.1 10*3/uL (ref 0.0–0.1)
IMMATURE GRANULOCYTES: 1 %
LYMPHS: 18 %
Lymphocytes Absolute: 2 10*3/uL (ref 0.7–3.1)
MCH: 28 pg (ref 26.6–33.0)
MCHC: 32.1 g/dL (ref 31.5–35.7)
MCV: 87 fL (ref 79–97)
MONOS ABS: 0.5 10*3/uL (ref 0.1–0.9)
Monocytes: 4 %
NEUTROS PCT: 76 %
Neutrophils Absolute: 8.2 10*3/uL — ABNORMAL HIGH (ref 1.4–7.0)
PLATELETS: 365 10*3/uL (ref 150–379)
RBC: 3.54 x10E6/uL — AB (ref 3.77–5.28)
RDW: 16.2 % — AB (ref 12.3–15.4)
WBC: 10.8 10*3/uL (ref 3.4–10.8)

## 2017-11-16 ENCOUNTER — Emergency Department (HOSPITAL_COMMUNITY)
Admission: EM | Admit: 2017-11-16 | Discharge: 2017-11-16 | Disposition: A | Discharge status: Home / Self Care | Payer: Medicare Other | Attending: Emergency Medicine | Admitting: Emergency Medicine

## 2017-11-16 ENCOUNTER — Encounter (HOSPITAL_COMMUNITY): Payer: Self-pay

## 2017-11-16 ENCOUNTER — Emergency Department (HOSPITAL_COMMUNITY): Payer: Medicare Other

## 2017-11-16 DIAGNOSIS — E1122 Type 2 diabetes mellitus with diabetic chronic kidney disease: Secondary | ICD-10-CM | POA: Diagnosis not present

## 2017-11-16 DIAGNOSIS — F17220 Nicotine dependence, chewing tobacco, uncomplicated: Secondary | ICD-10-CM | POA: Insufficient documentation

## 2017-11-16 DIAGNOSIS — Z79899 Other long term (current) drug therapy: Secondary | ICD-10-CM | POA: Diagnosis not present

## 2017-11-16 DIAGNOSIS — I130 Hypertensive heart and chronic kidney disease with heart failure and stage 1 through stage 4 chronic kidney disease, or unspecified chronic kidney disease: Secondary | ICD-10-CM | POA: Insufficient documentation

## 2017-11-16 DIAGNOSIS — I509 Heart failure, unspecified: Secondary | ICD-10-CM | POA: Diagnosis not present

## 2017-11-16 DIAGNOSIS — Z7902 Long term (current) use of antithrombotics/antiplatelets: Secondary | ICD-10-CM | POA: Insufficient documentation

## 2017-11-16 DIAGNOSIS — N183 Chronic kidney disease, stage 3 (moderate): Secondary | ICD-10-CM | POA: Insufficient documentation

## 2017-11-16 DIAGNOSIS — N3000 Acute cystitis without hematuria: Secondary | ICD-10-CM

## 2017-11-16 DIAGNOSIS — R1110 Vomiting, unspecified: Secondary | ICD-10-CM | POA: Diagnosis present

## 2017-11-16 LAB — URINALYSIS, ROUTINE W REFLEX MICROSCOPIC
Bilirubin Urine: NEGATIVE
GLUCOSE, UA: NEGATIVE mg/dL
Ketones, ur: NEGATIVE mg/dL
NITRITE: NEGATIVE
PH: 5 (ref 5.0–8.0)
PROTEIN: 30 mg/dL — AB
Specific Gravity, Urine: 1.013 (ref 1.005–1.030)

## 2017-11-16 LAB — CBC
HCT: 29.7 % — ABNORMAL LOW (ref 36.0–46.0)
Hemoglobin: 9.6 g/dL — ABNORMAL LOW (ref 12.0–15.0)
MCH: 28.3 pg (ref 26.0–34.0)
MCHC: 32.3 g/dL (ref 30.0–36.0)
MCV: 87.6 fL (ref 78.0–100.0)
PLATELETS: 265 10*3/uL (ref 150–400)
RBC: 3.39 MIL/uL — ABNORMAL LOW (ref 3.87–5.11)
RDW: 17 % — AB (ref 11.5–15.5)
WBC: 13.1 10*3/uL — ABNORMAL HIGH (ref 4.0–10.5)

## 2017-11-16 LAB — I-STAT TROPONIN, ED: Troponin i, poc: 0 ng/mL (ref 0.00–0.08)

## 2017-11-16 LAB — COMPREHENSIVE METABOLIC PANEL
ALBUMIN: 3.3 g/dL — AB (ref 3.5–5.0)
ALK PHOS: 69 U/L (ref 38–126)
ALT: 10 U/L — ABNORMAL LOW (ref 14–54)
AST: 19 U/L (ref 15–41)
Anion gap: 9 (ref 5–15)
BILIRUBIN TOTAL: 0.4 mg/dL (ref 0.3–1.2)
BUN: 32 mg/dL — AB (ref 6–20)
CALCIUM: 8.8 mg/dL — AB (ref 8.9–10.3)
CO2: 26 mmol/L (ref 22–32)
CREATININE: 1.56 mg/dL — AB (ref 0.44–1.00)
Chloride: 103 mmol/L (ref 101–111)
GFR calc Af Amer: 40 mL/min — ABNORMAL LOW (ref >60)
GFR calc non Af Amer: 35 mL/min — ABNORMAL LOW (ref >60)
GLUCOSE: 166 mg/dL — AB (ref 65–99)
Potassium: 3.4 mmol/L — ABNORMAL LOW (ref 3.5–5.1)
Sodium: 138 mmol/L (ref 135–145)
TOTAL PROTEIN: 7.3 g/dL (ref 6.5–8.1)

## 2017-11-16 LAB — INFLUENZA PANEL BY PCR (TYPE A & B)
INFLAPCR: NEGATIVE
Influenza B By PCR: NEGATIVE

## 2017-11-16 LAB — I-STAT CG4 LACTIC ACID, ED: Lactic Acid, Venous: 1.63 mmol/L (ref 0.5–1.9)

## 2017-11-16 LAB — LIPASE, BLOOD: Lipase: 36 U/L (ref 11–51)

## 2017-11-16 MED ORDER — FLUCONAZOLE 150 MG PO TABS: 150.0000 mg | ORAL_TABLET | Freq: Every day | ORAL | 0 refills | Status: AC

## 2017-11-16 MED ORDER — CEPHALEXIN 500 MG PO CAPS: 500.0000 mg | ORAL_CAPSULE | Freq: Two times a day (BID) | ORAL | 0 refills | Status: DC

## 2017-11-16 MED ORDER — IPRATROPIUM-ALBUTEROL 0.5-2.5 (3) MG/3ML IN SOLN
3.0000 mL | Freq: Once | RESPIRATORY_TRACT | Status: AC
  Administered 2017-11-16: 3 mL via RESPIRATORY_TRACT
  Filled 2017-11-16: qty 3

## 2017-11-16 MED ORDER — ACETAMINOPHEN 500 MG PO TABS
1000.0000 mg | ORAL_TABLET | Freq: Once | ORAL | Status: AC
  Administered 2017-11-16: 1000 mg via ORAL
  Filled 2017-11-16: qty 2

## 2017-11-16 MED ORDER — MORPHINE SULFATE (PF) 4 MG/ML IV SOLN
4.0000 mg | Freq: Once | INTRAVENOUS | Status: AC
  Administered 2017-11-16: 4 mg via INTRAVENOUS
  Filled 2017-11-16: qty 1

## 2017-11-16 MED ORDER — IOPAMIDOL (ISOVUE-300) INJECTION 61%
INTRAVENOUS | Status: AC
  Administered 2017-11-16: 75 mL via INTRAVENOUS
  Filled 2017-11-16: qty 75

## 2017-11-16 MED ORDER — SODIUM CHLORIDE 0.9 % IV BOLUS (SEPSIS)
1000.0000 mL | Freq: Once | INTRAVENOUS | Status: AC
  Administered 2017-11-16: 1000 mL via INTRAVENOUS

## 2017-11-16 NOTE — ED Triage Notes (Signed)
Patient arrive via [facility] from home. Patient woke up this morning with fever and chills. Patient vomited 3x since this morning. Hx. Of chronic back pain. No chest pain or dizziness. Temp- 101.7. 100 mg tylenol given per EMS in rout. Patient vomitted 1x in rout. 4 mg of Zofran given per EMS. 20 g in right AC. Rechecked temp-98.1 temporally.

## 2017-11-16 NOTE — ED Provider Notes (Signed)
[facility] DEPT Provider Note   CSN: [id number] Arrival date & time: 11/16/17  1218     History   Chief Complaint Chief Complaint  Patient presents with  . Emesis    HPI Jennifer Foley is a 62 y.o. female.  HPI   Jennifer Foley is a 62yo female with a history of afib (on xarelto), CHF, HTN, HLD, T2DM, Depression, Anxiety who presents to the Emergency Department for evaluation of fever, N/V, abdominal pain. She is a difficult historian. Patient states that her symptoms began this morning. Felt chills when she woke up and had a measured temperature of 102F at home. She then had three episodes of vomitus. Reports generalized abdominal pain which is 9/10 in severity and intermittent. Her abdominal pain radiates to her lower back. Also endorses burning with urination and increased urinary frequency for the past two days. Furthermore, she states that she has had a cough for the last two days with sore throat. Reports left sided chest pain with cough which is reproducible to palpation. Pain does not radiate. She denies shortness of breath. Reports frontal headache which is throbbing. Has not taken any medication for her pain. Denies sick contacts at home. States that she uses an albuterol inhaler as needed but states that she has never been diagnosed with COPD or asthma. She denies visual disturbance, numbness, weakness, lightheadedness, dizziness, hematemesis, diarrhea, melena, hematochezia, congestion. Last bowel movement was yesterday and normal. Denies leg swelling or calf pain, pleuritic CP, history of DVT/PE, exogenous estrogen or recent travel or immobility. Denies history of ischemic heart disease. Reports she previously smoked, but quit 59yrs ago.   Past Medical History:  Diagnosis Date  . Anemia   . Atrial fibrillation ([place])   . Bilateral breast cysts   . CHF (congestive heart failure) ([place])   . Chronic kidney disease    stage 3  . Depression with  anxiety   . DM (diabetes mellitus) ([place])   . Esophageal reflux   . HTN (hypertension)   . Hyperlipemia   . Hyperthyroidism   . INR (international normal ratio) abnormal   . MRSA (methicillin resistant staph aureus) culture positive   . Pneumonia   . PTSD (post-traumatic stress disorder)     There are no active problems to display for this patient.   Past Surgical History:  Procedure Laterality Date  . ABDOMINAL HYSTERECTOMY    . BREAST BIOPSY    . CATARACT EXTRACTION    . CESAREAN SECTION    . DENTAL SURGERY    . FOOT SURGERY    . HERNIA REPAIR    . KNEE SURGERY    . TONSILLECTOMY AND ADENOIDECTOMY      OB History    No data available       Home Medications    Prior to Admission medications   Medication Sig Start Date End Date Taking? Authorizing Provider  benzonatate (TESSALON) 100 MG capsule Take 1 capsule (100 mg total) by mouth every 8 (eight) hours. 11/15/16   Domenic Moras, PA-C  busPIRone (BUSPAR) 10 MG tablet Take 10 mg by mouth 2 (two) times daily.    [provider]  estradiol (ESTRACE) 0.1 MG/GM vaginal cream Place 1 Applicatorful vaginally 2 (two) times a week.    [provider]  ferrous gluconate (FERGON) 325 MG tablet Take 325 mg by mouth 2 (two) times daily.     [provider]  fluticasone (FLOVENT HFA) 110 MCG/ACT inhaler Inhale into the lungs. Use  as directed 08/20/17   [provider]  folic acid (FOLVITE) 1 MG tablet Take 1 mg by mouth daily. 09/18/17   [provider]  furosemide (LASIX) 40 MG tablet Take 40 mg by mouth daily.    [provider]  levothyroxine (SYNTHROID, LEVOTHROID) 50 MCG tablet Take 50 mcg by mouth daily before breakfast.    [provider]  lisinopril-hydrochlorothiazide (PRINZIDE,ZESTORETIC) 20-12.5 MG per tablet Take 1 tablet by mouth daily.    [provider]  neomycin-polymyxin-dexameth (MAXITROL) 0.1 % OINT Place 1 application into the right eye at  bedtime.    [provider]  omeprazole (PRILOSEC) 20 MG capsule Take 20 mg by mouth daily.    [provider]  oxyCODONE-acetaminophen (PERCOCET/ROXICET) 5-325 MG tablet Take 1 tablet by mouth every 8 (eight) hours as needed for severe pain.    [provider]  pravastatin (PRAVACHOL) 40 MG tablet Take 40 mg by mouth daily.    [provider]  promethazine (PHENERGAN) 25 MG tablet Take 25-50 mg by mouth every 6 (six) hours as needed for nausea or vomiting.    [provider]  rivaroxaban (XARELTO) 20 MG TABS tablet Take 20 mg by mouth every evening.     [provider]  sertraline (ZOLOFT) 100 MG tablet Take 100 mg by mouth daily.     [provider]  tiZANidine (ZANAFLEX) 4 MG tablet Take 4 mg by mouth every 6 (six) hours as needed for muscle spasms.    [provider]    Family History Family History  Problem Relation Age of Onset  . Alcoholism Father   . Arthritis Father   . Hypertension Father        sister, mother  . Diabetes Mellitus I Mother        sister  . Renal Disease Mother   . Stroke Mother   . Epilepsy Brother     Social History Social History   Tobacco Use  . Smoking status: Former Smoker    Last attempt to quit: 12/15/2003    Years since quitting: 13.9  . Smokeless tobacco: Current User    Types: Snuff  . Tobacco comment: 3 times per day  Substance Use Topics  . Alcohol use: Not on file  . Drug use: Not on file     Allergies   Codeine   Review of Systems Review of Systems  Constitutional: Positive for chills, fatigue and fever.  HENT: Positive for sore throat. Negative for congestion and ear pain.   Respiratory: Positive for cough and wheezing. Negative for shortness of breath.   Cardiovascular: Positive for chest pain. Negative for leg swelling.  Gastrointestinal: Positive for abdominal pain, nausea and vomiting. Negative for blood in stool and diarrhea.  Genitourinary: Positive  for dysuria. Negative for difficulty urinating, flank pain and frequency.  Musculoskeletal: Positive for back pain. Negative for gait problem.  Skin: Negative for rash.  Neurological: Positive for headaches. Negative for dizziness, weakness, light-headedness and numbness.     Physical Exam Updated Vital Signs BP (!) 100/51 (BP Location: Left Arm)   Pulse 100   Temp 98.8 F (37.1 C) (Oral)   Resp 12   SpO2 95%   Physical Exam  Constitutional: She is oriented to person, place, and time. She appears well-developed and well-nourished. No distress.  HENT:  Head: Normocephalic and atraumatic.  Mouth/Throat: Oropharynx is clear and moist. No oropharyngeal exudate.  Mucous membranes moist  Eyes: Right eye exhibits no discharge. Left  eye exhibits no discharge.  Cardiovascular: Normal rate, regular rhythm and intact distal pulses. Exam reveals no friction rub.  No murmur heard. Pulmonary/Chest: Effort normal. No respiratory distress.  Able to speak in full sentences. Good air movement. Wheezing in bilateral lung bases. No rhonchi, rales. Tender to palpation over the left chest wall. No overlying rash, erythema, ecchymosis.   Abdominal: Soft. Bowel sounds are normal.  Tender to palpation grossly over the abdomen. No guarding or rigidity. No rebound. No CVA tenderness.   Musculoskeletal:  No tenderness over the t-spine or l-spine. No tenderness over bilateral paraspinal muscles. No leg swelling or calf tenderness.   Neurological: She is alert and oriented to person, place, and time. Coordination normal.  Mental Status:  Alert, oriented, thought content appropriate, able to give a coherent history. Speech fluent without evidence of aphasia. Able to follow 2 step commands without difficulty.  Cranial Nerves:  II:  Peripheral visual fields grossly normal, pupils equal, round, reactive to light III,IV, VI: ptosis not present, extra-ocular motions intact bilaterally  V,VII: smile symmetric,  facial light touch sensation equal VIII: hearing grossly normal to voice  X: uvula elevates symmetrically  XI: bilateral shoulder shrug symmetric and strong XII: midline tongue extension without fassiculations Motor:  Normal tone. 5/5 in upper and lower extremities bilaterally including strong and equal grip strength and dorsiflexion/plantar flexion Sensory: Pinprick and light touch normal in all extremities.  Deep Tendon Reflexes: 1+ and symmetric in the biceps and patella Cerebellar: normal finger-to-nose with bilateral upper extremities  Skin: Skin is warm and dry. She is not diaphoretic.  Psychiatric: She has a normal mood and affect. Her behavior is normal.  Nursing note and vitals reviewed.   ED Treatments / Results  Labs (all labs ordered are listed, but only abnormal results are displayed) Labs Reviewed  COMPREHENSIVE METABOLIC PANEL - Abnormal; Notable for the following components:      Result Value   Potassium 3.4 (*)    Glucose, Bld 166 (*)    BUN 32 (*)    Creatinine, Ser 1.56 (*)    Calcium 8.8 (*)    Albumin 3.3 (*)    ALT 10 (*)    GFR calc non Af Amer 35 (*)    GFR calc Af Amer 40 (*)    All other components within normal limits  CBC - Abnormal; Notable for the following components:   WBC 13.1 (*)    RBC 3.39 (*)    Hemoglobin 9.6 (*)    HCT 29.7 (*)    RDW 17.0 (*)    All other components within normal limits  URINALYSIS, ROUTINE W REFLEX MICROSCOPIC - Abnormal; Notable for the following components:   APPearance CLOUDY (*)    Hgb urine dipstick MODERATE (*)    Protein, ur 30 (*)    Leukocytes, UA LARGE (*)    Bacteria, UA RARE (*)    Squamous Epithelial / LPF 6-30 (*)    All other components within normal limits  LIPASE, BLOOD  INFLUENZA PANEL BY PCR (TYPE A & B)  I-STAT CG4 LACTIC ACID, ED  I-STAT TROPONIN, ED    EKG  EKG Interpretation  Date/Time:  Friday November 16 2017 14:11:00 EST Ventricular Rate:  106 PR Interval:    QRS  Duration: 76 QT Interval:  321 QTC Calculation: 427 R Axis:   57 Text Interpretation:  Sinus tachycardia Borderline T abnormalities, anterior leads Abnormal ekg Confirmed by Carmin Muskrat [phone]) on 11/16/2017 3:35:01 PM  Radiology Dg Chest 2 View  Result Date: 11/16/2017 CLINICAL DATA:  62 year old female with fever, shortness of breast and chills today. EXAM: CHEST  2 VIEW COMPARISON:  11/15/2016 and prior radiographs FINDINGS: The cardiomediastinal silhouette is unremarkable. There is no evidence of focal airspace disease, pulmonary edema, suspicious pulmonary nodule/mass, pleural effusion, or pneumothorax. No acute bony abnormalities are identified. IMPRESSION: No active cardiopulmonary disease. Electronically Signed   By: Margarette Canada M.D.   On: 11/16/2017 14:32   Ct Abdomen Pelvis W Contrast  Result Date: 11/16/2017 CLINICAL DATA:  fever and chills. Patient vomited 3x since this morning. Hx. Of chronic back pain. No chest pain or dizziness. Temp- 101.7. 100 mg tylenol given per EMS in rout. Patient vomitted 1x EXAM: CT ABDOMEN AND PELVIS WITH CONTRAST TECHNIQUE: Multidetector CT imaging of the abdomen and pelvis was performed using the standard protocol following bolus administration of intravenous contrast. CONTRAST:  44mL ISOVUE-300 IOPAMIDOL (ISOVUE-300) INJECTION 61% COMPARISON:  12/24/2015 and previous FINDINGS: Lower chest: No acute abnormality. Hepatobiliary: No focal liver abnormality is seen. No gallstones, gallbladder wall thickening, or biliary dilatation. Pancreas: Unremarkable. No pancreatic ductal dilatation or surrounding inflammatory changes. Spleen: Normal in size without focal abnormality. Adrenals/Urinary Tract: Adrenal glands are unremarkable. Kidneys are normal, without renal calculi, focal lesion, or hydronephrosis. Bladder is unremarkable. Stomach/Bowel: Stomach is within normal limits. Appendix appears normal. No evidence of bowel wall thickening, distention, or  inflammatory changes. Vascular/Lymphatic: No abdominal or pelvic adenopathy. Scattered calcified plaque in the aorta and common iliac arteries without aneurysm or stenosis. Portal vein patent. Reproductive: Status post hysterectomy. No adnexal masses. Other: No ascites.  No free air. Musculoskeletal: No acute or significant osseous findings. IMPRESSION: 1. No acute findings. 2.  Aortic Atherosclerosis (ICD10-170.0) Electronically Signed   By: Lucrezia Europe M.D.   On: 11/16/2017 15:01    Procedures Procedures (including critical care time)  Medications Ordered in ED Medications  acetaminophen (TYLENOL) tablet 1,000 mg (1,000 mg Oral Given 11/16/17 1408)  sodium chloride 0.9 % bolus 1,000 mL (0 mLs Intravenous Stopped 11/16/17 1505)  ipratropium-albuterol (DUONEB) 0.5-2.5 (3) MG/3ML nebulizer solution 3 mL (3 mLs Nebulization Given 11/16/17 1412)  morphine 4 MG/ML injection 4 mg (4 mg Intravenous Given 11/16/17 1408)  iopamidol (ISOVUE-300) 61 % injection (75 mLs Intravenous Contrast Given 11/16/17 1433)     Initial Impression / Assessment and Plan / ED Course  I have reviewed the triage vital signs and the nursing notes.  Pertinent labs & imaging results that were available during my care of the patient were reviewed by me and considered in my medical decision making (see chart for details).    CT abdomen/pelvis without acute abnormality. UA reveals TNTC WBCs and RBCs. CBC with mild leukocytosis 13.1, Hgb at baseline. No CVA tenderness or findings on CT scan to suggest acute pyelonephritis. Will treat with keflex for acute cystitis. CMP unremarkable, kidney function at baseline and she has an elevated glucose (bg 166, patient known diabetic). Influenza negative. No lactic acidosis.   Patient's chest pain is atypical for ACS given her chest pain occurs with cough only and is reproducible with palpation. Doubt cardiac etiology to her chest pain given presentation, CXR without acute abnormality, troponin  negative and EKG findings. Do not suspect PE given no pleuritic chest pain, calf swelling or tenderness, history of DVT/PE, recent surgery or immobility or exogenous estrogen use. Discussed patient's results with Dr. Vanita Panda who also saw the patient and agrees.   Patient received one duoneb treatment given  wheezing on initial exam. On recheck patient states she is feeling better. Asking for water. Abdomen non-tender. She denies chest pain. Lung exam CTA. Requesting diflucan for yeast infections that she often gets following antibiotic use. She is able to tolerate po fluids at the bedside. Have counseled her on use of albuterol at home PRN for cough and wheezing. Discussed return precautions and patient agrees. Discussed follow up with her primary doctor regarding today's ER visit.   Final Clinical Impressions(s) / ED Diagnoses   Final diagnoses:  Acute cystitis without hematuria    ED Discharge Orders        Ordered    cephALEXin (KEFLEX) 500 MG capsule  2 times daily     11/16/17 1558    fluconazole (DIFLUCAN) 150 MG tablet  Daily     11/16/17 1558       Glyn Ade, [place] 11/17/17 0847    Carmin Muskrat, MD 11/18/17 1700

## 2017-11-16 NOTE — ED Notes (Signed)
Pt is aware a urine sample is needed, but is unable to provide one at this time. 

## 2017-11-16 NOTE — ED Notes (Signed)
Bed: [id number] Expected date:  Expected time:  Means of arrival:  Comments: EMS-vomiting

## 2017-11-16 NOTE — Discharge Instructions (Signed)
Your blood work, chest xray and scan of your stomach was reassuring.   Please take antibiotic twice a day for the next 7days.   Please take all of your antibiotics until finished!   You may develop abdominal discomfort or diarrhea from the antibiotic.  You may help offset this with probiotics which you can buy or get in yogurt. Do not eat  or take the probiotics until 2 hours after your antibiotic.   I have also written you a prescription for medicine which you can take if you develop a yeast infection with antibiotic.   Use albuterol inhaler as needed for cough and wheezing  Follow up with your primary doctor next week for recheck.   Return to the ER if you have worsening stomach pain, chest pain with shortness of breath, chest pain that radiates to the left arm or jaw, vomiting that will not stop or have any new or worsening symptoms.

## 2017-12-25 ENCOUNTER — Ambulatory Visit: Payer: Self-pay | Admitting: Neurology

## 2017-12-26 NOTE — Progress Notes (Signed)
[facility] Note  12/27/2017     CHIEF COMPLAINT Patient presents for Retina Evaluation and Diabetic Eye Exam   HISTORY OF PRESENT ILLNESS: Jennifer Foley is a 62 y.o. female who presents to the clinic today for:   HPI    Retina Evaluation    In both eyes.  This started 4 years ago.  Associated Symptoms Flashes, Floaters and Pain.  Negative for Trauma, Fever, Weight Loss, Redness, Scalp Tenderness, Fatigue, Jaw Claudication, Photophobia, Distortion, Blind Spot, Glare and Shoulder/Hip pain.  Context:  distance vision, mid-range vision, near vision, reading and watching TV.  Treatments tried include surgery and injection.  Response to treatment was mild improvement.  I, the attending physician,  performed the HPI with the patient and updated documentation appropriately.          Diabetic Eye Exam    Vision is stable.  Associated Symptoms Negative for Flashes, Pain, Trauma, Fever, Weight Loss, Scalp Tenderness, Redness, Floaters, Distortion, Photophobia, Jaw Claudication, Fatigue, Shoulder/Hip pain, Glare and Blind Spot.  Diabetes characteristics include controlled with diet.  This started 10 years ago.  Blood sugar level is controlled.  Last A1C 6.  I, the attending physician,  performed the HPI with the patient and updated documentation appropriately.          Comments    Referral of DR. Manuella Ghazi for Retina eval.(peripheral focal chorioretinal inflammation OU) 2nd opinion. Patient states  She feels as if she has grit in her eyes , more so in the left. Pt reports when she closes her eyes , her will not open sometimes, she has to wait for the muscle of her eye to release for it to open. Pt reports having wavy VA, flashing of light and spots OU. Pt is DM2 , Bs diet controlled, pt does not check her Bs,  BS are stable. Pt reports having Cataract sx 2018 Ou, injections x3 OD 2018, then injection OS x1 2018, Tissue removed under her right eye 2018. Pt is using Durezol  gtt's QID. Denies vit's       Last edited by Bernarda Caffey, MD on 12/27/2017  9:56 AM. (History)    Pt states she has been seeing Dr. Manuella Ghazi x 2 years; Reports she is on 15 mg of methotrexate and durezol OU QID; Pt reports she she started at 63m of methotrexate and has since been increased to 15 mg;   Referring physician: SFeliz Beam MD 1Emmett Helper [id number] HISTORICAL INFORMATION:   Selected notes from the MEDICAL RECORD NUMBER Referred by Dr. RMatilde Bashfor concern of peripheral focal chorioretinal inflammation OU (second opinion);  LEE- 03.01.19 (R. 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 DUREZOL 0.05 % EMUL Place 1 drop into both eyes 4 (four) times daily.   No current facility-administered medications for this visit.  (Ophthalmic Drugs)   Current Outpatient Medications (Other)  Medication Sig  . acetaminophen (TYLENOL) 500 MG tablet Take 1,000 mg by mouth daily as needed for mild pain.  .Marland Kitchenalbuterol (VENTOLIN HFA) 108 (90 Base) MCG/ACT inhaler Inhale 2 puffs into the lungs every 6 (six) hours as needed.  . busPIRone (BUSPAR) 10 MG  tablet Take 10 mg by mouth 2 (two) times daily.  . ferrous gluconate (FERGON) 324 MG tablet Take 324 mg by mouth 2 (two) times daily.   . folic acid (FOLVITE) 1 MG tablet Take 1 mg by mouth daily.  . furosemide (LASIX) 40 MG tablet Take 40 mg by mouth daily.  Marland Kitchen levothyroxine (SYNTHROID, LEVOTHROID) 50 MCG tablet Take 50 mcg by mouth daily before breakfast.  . lisinopril-hydrochlorothiazide (PRINZIDE,ZESTORETIC) 20-12.5 MG per tablet Take 1 tablet by mouth daily.  . methotrexate 2.5 MG tablet Take 15 mg  by mouth once a week.  Marland Kitchen omeprazole (PRILOSEC) 20 MG capsule Take 20 mg by mouth daily.  . pravastatin (PRAVACHOL) 40 MG tablet Take 40 mg by mouth daily.  . rivaroxaban (XARELTO) 20 MG TABS tablet Take by mouth every evening.   . sertraline (ZOLOFT) 100 MG tablet Take 50 mg by mouth daily.   Marland Kitchen tiZANidine (ZANAFLEX) 4 MG tablet Take 4 mg by mouth every 6 (six) hours as needed for muscle spasms.  . benzonatate (TESSALON) 100 MG capsule Take 1 capsule (100 mg total) by mouth every 8 (eight) hours. (Patient not taking: Reported on 12/27/2017)  . cephALEXin (KEFLEX) 500 MG capsule Take 1 capsule (500 mg total) by mouth 2 (two) times daily. (Patient not taking: Reported on 12/27/2017)   No current facility-administered medications for this visit.  (Other)      REVIEW OF SYSTEMS: ROS    Positive for: Musculoskeletal, Endocrine, Eyes   Negative for: Constitutional, Gastrointestinal, Neurological, Skin, Genitourinary, HENT, Cardiovascular, Respiratory, Psychiatric, Allergic/Imm, Heme/Lymph   Last edited by Zenovia Jordan, LPN on 02/23/85  7:61 AM. (History)       ALLERGIES Allergies  Allergen Reactions  . Codeine Other (See Comments)    Breaks out in a rash    PAST MEDICAL HISTORY Past Medical History:  Diagnosis Date  . Anemia   . Atrial fibrillation ([place])   . Bilateral breast cysts   . CHF (congestive heart failure) ([place])   . Chronic kidney disease    stage 3  . Depression with anxiety   . DM (diabetes mellitus) ([place])   . Esophageal reflux   . HTN (hypertension)   . Hyperlipemia   . Hyperthyroidism   . INR (international normal ratio) abnormal   . MRSA (methicillin resistant staph aureus) culture positive   . Pneumonia   . PTSD (post-traumatic stress disorder)    Past Surgical History:  Procedure Laterality Date  . ABDOMINAL HYSTERECTOMY    . BREAST BIOPSY    . CATARACT EXTRACTION  2018  . CESAREAN SECTION    . DENTAL SURGERY    . EYE SURGERY    . FOOT SURGERY    .  HERNIA REPAIR    . KNEE SURGERY    . TONSILLECTOMY AND ADENOIDECTOMY      FAMILY HISTORY Family History  Problem Relation Age of Onset  . Alcoholism Father   . Arthritis Father   . Hypertension Father        sister, mother  . Diabetes Mellitus I Mother        sister  . Renal Disease Mother   . Stroke Mother   . Epilepsy Brother     SOCIAL HISTORY Social History   Tobacco Use  . Smoking status: Former Smoker    Last attempt to quit: 12/15/2003    Years since quitting: 14.0  . Smokeless tobacco: Current User    Types: Snuff  . Tobacco comment: 3 times per  day  Substance Use Topics  . Alcohol use: Not on file  . Drug use: Not on file         OPHTHALMIC EXAM:  Base Eye Exam    Visual Acuity (Snellen - Linear)      Right Left   Dist cc 20/150 -2 20/250   Dist ph cc 20/100 20/200 -2   Correction:  Glasses       Tonometry (Tonopen, 9:18 AM)      Right Left   Pressure 14 14       Pupils      Dark Light Shape React APD   Right 3 2 Round Slow None   Left 3 2 Round Slow None       Visual Fields (Counting fingers)      Left Right     Full       Extraocular Movement      Right Left    Full, Ortho Full, Ortho       Neuro/Psych    Oriented x3:  Yes   Mood/Affect:  Normal       Dilation    Both eyes:  1.0% Mydriacyl, 2.5% Phenylephrine @ 9:18 AM        Slit Lamp and Fundus Exam    Slit Lamp Exam      Right Left   Lids/Lashes Ptosis Ptosis, Meibomian gland dysfunction   Conjunctiva/Sclera Melanosis Melanosis   Cornea guttata, Well healed temporal cataract wounds, Arcus, endo pigment  guttata, Arcus, Well healed cataract wounds, endo pigment   Anterior Chamber Deep, 0.5+ Cell 0.5+ Cell, Flare   Iris Round and moderately dilated Round and dilated   Lens PC IOL in good position with pigment on anterior optic, trace Posterior capsular opacification PC IOL in good position with pigment on anterior optic, trace Posterior capsular opacification   Vitreous  1+ cell; 1+ vit haze, Vitreous syneresis 3+ cell; 1+ vit haze, Vitreous syneresis       Fundus Exam      Right Left   Disc Normal Optic disc edema, Superior disc Hemorrhage, hazy view   C/D Ratio 0.4    Macula Blunted foveal reflex, Retinal pigment epithelial mottling, scattered IRH Hazy view, grossly flat, scattered IRH   Vessels Arteriole attenuation, Dilation of venules, Tortuous venules Arteriole attenuation, Dilation of venules, Tortuous venules   Periphery Attached, scattered DBH Hazy view, grossly attached, scattered DBH        Refraction    Wearing Rx      Sphere Cylinder Axis Add   Right -1.25 +1.25 167 +2.00   Left -0.50 +0.75 052 +2.00       Manifest Refraction      Sphere Cylinder Axis Dist VA   Right +1.25 +1.75 167 20/100   Left [place] +0.75 052 20/150          IMAGING AND PROCEDURES  Imaging and Procedures for 12/29/17  OCT, Retina - OU - Both Eyes     Right Eye Quality was good. Central Foveal Thickness: 309. Progression has no prior data. Findings include normal foveal contour, no IRF, subretinal fluid, epiretinal membrane (Tr cystic changes).   Left Eye Quality was good. Central Foveal Thickness: 321. Progression has no prior data. Findings include epiretinal membrane, normal foveal contour, intraretinal fluid, subretinal fluid, outer retinal atrophy.   Notes *Images captured and stored on drive  Diagnosis / Impression:  OD: NFP; vitelliform like lesion/SRF; ERM OS: abnormal foveal contour; vitelliform like lesion; +IRF, +ORA; ERM  Clinical management:  See below  Abbreviations: NFP - Normal foveal profile. CME - cystoid macular edema. PED - pigment epithelial detachment. IRF - intraretinal fluid. SRF - subretinal fluid. EZ - ellipsoid zone. ERM - epiretinal membrane. ORA - outer retinal atrophy. ORT - outer retinal tubulation. SRHM - subretinal hyper-reflective material          Fluorescein Angiography Optos (Transit OS)     Right  Eye Progression has no prior data. Early phase findings include microaneurysm, vascular perfusion defect. Mid/Late phase findings include leakage, vascular perfusion defect, microaneurysm.   Left Eye Progression has no prior data. Early phase findings include microaneurysm, vascular perfusion defect. Mid/Late phase findings include leakage, microaneurysm, vascular perfusion defect.   Notes Impression:  OD: +microaneurysms; hyperfluorescent leakage of disc; phlebitis with diffuse leakage; late petaloid macular leakage; scattered patches of capillary nonperfusion OS: +microaneurysms; scattered patches of capillary nonperfusion; 3-4+ hyperfluorescent leakage of disc; very active phlebitis with diffuse leakage -- greatest in inf temporal quad; diffuse macular leakage                ASSESSMENT/PLAN:    ICD-10-CM   1. 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 Retinal edema H35.81 OCT, Retina - OU - Both Eyes  3. Moderate nonproliferative diabetic retinopathy of both eyes with macular edema associated with type 2 diabetes mellitus ([place]) Q76.1950   4. Epiretinal membrane (ERM) of both eyes H35.373   5. Pseudophakia of both eyes Z96.1     1,2. 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 SManuella Ghaziand will send FA images - expert immunosuppression management per Dr. SManuella Ghazi but pt likely needs increase in MTX vs additional agent - pt has appt with Dr. SManuella Ghaziin April - pt to follow up here as needed per Dr. SManuella Ghazi 3. Moderate nonproliferative diabetic retinopathy w/ DME, both eyes - The incidence, risk factors for  progression, natural history and treatment options for diabetic retinopathy were discussed with patient.   - The need for close monitoring of blood glucose, blood pressure, and serum lipids, avoiding cigarette or any type of tobacco, and the need for long term follow up was also discussed with patient. - under the expert management of Dr. SManuella Ghazi 4. Epiretinal membrane, both eyes  The natural history, anatomy, potential for loss of vision, and treatment options including vitrectomy techniques and the complications of endophthalmitis, retinal detachment, vitreous hemorrhage, cataract progression and permanent vision loss discussed with the patient. - mild - monitor  5. Pseudophakia OU - monitor    Ophthalmic Meds Ordered this visit:  Meds ordered this encounter  Medications  . diphenhydrAMINE (BENADRYL) injection 25 mg       Return if symptoms worsen or fail to improve.  There are no Patient Instructions on file for this visit.   Explained the diagnoses, plan, and follow up with the patient and they expressed understanding.  Patient expressed understanding of the importance of proper follow up care.   This document serves as a record of services personally performed by BGardiner Sleeper MD, PhD. It was created on their behalf by MCatha Brow CMinor  a certified ophthalmic assistant. The creation of this record is the provider's dictation and/or activities during the visit.  Electronically signed by: Catha Brow, COA  12/29/17 10:45 AM    Gardiner Sleeper, M.D., Ph.D. Diseases & Surgery of the Retina and [place] 12/29/17   I have reviewed the above documentation for accuracy and completeness, and I agree with the above. Gardiner Sleeper, M.D., Ph.D. 12/29/17 11:01 AM    Abbreviations: M myopia (nearsighted); A astigmatism; H hyperopia (farsighted); P presbyopia; Mrx spectacle prescription;  CTL contact lenses; OD right eye; OS left eye;  OU both eyes  XT exotropia; ET esotropia; PEK punctate epithelial keratitis; PEE punctate epithelial erosions; DES dry eye syndrome; MGD meibomian gland dysfunction; ATs artificial tears; PFAT's preservative free artificial tears; Dawson nuclear sclerotic cataract; PSC posterior subcapsular cataract; ERM epi-retinal membrane; PVD posterior vitreous detachment; RD retinal detachment; DM diabetes mellitus; DR diabetic retinopathy; NPDR non-proliferative diabetic retinopathy; PDR proliferative diabetic retinopathy; CSME clinically significant macular edema; DME diabetic macular edema; dbh dot blot hemorrhages; CWS cotton wool spot; POAG primary open angle glaucoma; C/D cup-to-disc ratio; HVF humphrey visual field; GVF goldmann visual field; OCT optical coherence tomography; IOP intraocular pressure; BRVO Branch retinal vein occlusion; CRVO central retinal vein occlusion; CRAO central retinal artery occlusion; BRAO branch retinal artery occlusion; RT retinal tear; SB scleral buckle; PPV pars plana vitrectomy; VH Vitreous hemorrhage; PRP panretinal laser photocoagulation; IVK intravitreal kenalog; VMT vitreomacular traction; MH Macular hole;  NVD neovascularization of the disc; NVE neovascularization elsewhere; AREDS age related eye disease study; ARMD age related macular degeneration; POAG primary open angle glaucoma; EBMD epithelial/anterior basement membrane dystrophy; ACIOL anterior chamber intraocular lens; IOL intraocular lens; PCIOL posterior chamber intraocular lens; Phaco/IOL phacoemulsification with intraocular lens placement; Anton photorefractive keratectomy; LASIK laser assisted in situ keratomileusis; HTN hypertension; DM diabetes mellitus; COPD chronic obstructive pulmonary disease

## 2017-12-27 ENCOUNTER — Ambulatory Visit (INDEPENDENT_AMBULATORY_CARE_PROVIDER_SITE_OTHER): Payer: Medicare Other | Admitting: Ophthalmology

## 2017-12-27 ENCOUNTER — Encounter (INDEPENDENT_AMBULATORY_CARE_PROVIDER_SITE_OTHER): Payer: Self-pay | Admitting: Ophthalmology

## 2017-12-27 DIAGNOSIS — E113313 Type 2 diabetes mellitus with moderate nonproliferative diabetic retinopathy with macular edema, bilateral: Secondary | ICD-10-CM

## 2017-12-27 DIAGNOSIS — H30033 Focal chorioretinal inflammation, peripheral, bilateral: Secondary | ICD-10-CM | POA: Diagnosis not present

## 2017-12-27 DIAGNOSIS — Z961 Presence of intraocular lens: Secondary | ICD-10-CM | POA: Diagnosis not present

## 2017-12-27 DIAGNOSIS — H3581 Retinal edema: Secondary | ICD-10-CM | POA: Diagnosis not present

## 2017-12-27 DIAGNOSIS — H35373 Puckering of macula, bilateral: Secondary | ICD-10-CM | POA: Diagnosis not present

## 2017-12-27 MED ORDER — DIPHENHYDRAMINE HCL 50 MG/ML IJ SOLN
25.0000 mg | Freq: Once | INTRAMUSCULAR | Status: AC
  Administered 2017-12-27: 25 mg via INTRAMUSCULAR

## 2017-12-29 ENCOUNTER — Encounter (INDEPENDENT_AMBULATORY_CARE_PROVIDER_SITE_OTHER): Payer: Self-pay | Admitting: Ophthalmology

## 2018-01-22 ENCOUNTER — Ambulatory Visit (INDEPENDENT_AMBULATORY_CARE_PROVIDER_SITE_OTHER): Payer: Medicare Other | Admitting: Neurology

## 2018-01-22 ENCOUNTER — Encounter: Payer: Self-pay | Admitting: Neurology

## 2018-01-22 VITALS — BP 109/62 | HR 103 | Ht 61.0 in | Wt 212.0 lb

## 2018-01-22 DIAGNOSIS — G4733 Obstructive sleep apnea (adult) (pediatric): Secondary | ICD-10-CM

## 2018-01-22 NOTE — Progress Notes (Signed)
Subjective:    Patient ID: Jennifer Foley is a 62 y.o. female.  HPI     Interim history:  Jennifer Foley is a 62 year old right-handed woman with an underlying medical history of hypertension, hyperlipidemia, hyperthyroidism, reflux disease, diabetes, depression, anxiety, anemia, A. fib, chronic kidney disease, congestive heart failure, asthma, and obesity, who presents for follow-up consultation of her obstructive sleep apnea, recent sleep study testing and starting AutoPap therapy. The patient is unaccompanied today. I first met her on 08/07/2017 at the request of her primary care provider, at which time she reported snoring and daytime somnolence as well as waking up at night with shortness of breath. She was invited for sleep study. She had a baseline sleep study on 10/09/2017. I went over her test results with her in detail today. Sleep efficiency was 61.6%, sleep latency 30.5 minutes and REM sleep was absent. She had an increased percentage of stage II sleep, slow-wave sleep was 18.1%, REM sleep was absent. She had mild snoring. Total AHI was 5.8 per hour, she did not achieve supine sleep. Average oxygen saturation was 98%, nadir was 87%, she did not have any significant PLMS. Based on her medical history and sleep related complaints I suggested home treatment with AutoPap therapy. She was also advised that absence of supine sleep and absence of REM sleep during the sleep study with likely underestimated her sleep disordered breathing.  Today, 01/22/2018: I reviewed her AutoPap compliance data from 12/22/2017 through 01/20/2018 which is a total of 30 days, during which time she used her AutoPap only 3 days, indicating noncompliance. In the past 57 days from 11/25/2017 through 01/20/2018 she used her AutoPap about 8 days, again indicating noncompliance. She reports that she has had difficulty using her AutoPap she was not sure how to use it even though she was told how to use it by her DME company.  She would be willing to make another appointment for reeducation and relearning.   The patient's allergies, current medications, family history, past medical history, past social history, past surgical history and problem list were reviewed and updated as appropriate.   Previously (copied from previous notes for reference):   08/07/2017: (She) reports snoring and excessive daytime somnolence. She has never had a sleep study. I reviewed your office note from 05/28/2017, which you kindly included. Her Epworth sleepiness score is 18 out of 24, fatigue score is 56 out of 63. She reports waking up at night at times with shortness of breath. She has no FHx of OSA, no RLS symptoms, has nocturia about twice per night, and AM HAs, about 2 times a week. She reports a bedtime of around 10 or 11 PM, wakeup time around 6 or 7. She lives alone. Her daughter checks on her frequently. She has one daughter and 2 sons. She is disabled. She quit smoking 10 or 11 years ago, does not utilize alcohol currently and drinks caffeine in the form of sodas, typically 2 cans per day. She had a tonsillectomy as a teenager. She has gained weight in the past few years.  Her Past Medical History Is Significant For: Past Medical History:  Diagnosis Date  . Anemia   . Atrial fibrillation ([place])   . Bilateral breast cysts   . CHF (congestive heart failure) ([place])   . Chronic kidney disease    stage 3  . Depression with anxiety   . DM (diabetes mellitus) ([place])   . Esophageal reflux   . HTN (hypertension)   . Hyperlipemia   .  Hyperthyroidism   . INR (international normal ratio) abnormal   . MRSA (methicillin resistant staph aureus) culture positive   . Pneumonia   . PTSD (post-traumatic stress disorder)     Her Past Surgical History Is Significant For: Past Surgical History:  Procedure Laterality Date  . ABDOMINAL HYSTERECTOMY    . BREAST BIOPSY    . CATARACT EXTRACTION  2018  . CESAREAN SECTION    . DENTAL SURGERY     . EYE SURGERY    . FOOT SURGERY    . HERNIA REPAIR    . KNEE SURGERY    . TONSILLECTOMY AND ADENOIDECTOMY      Her Family History Is Significant For: Family History  Problem Relation Age of Onset  . Alcoholism Father   . Arthritis Father   . Hypertension Father        sister, mother  . Diabetes Mellitus I Mother        sister  . Renal Disease Mother   . Stroke Mother   . Epilepsy Brother     Her Social History Is Significant For: Social History   Socioeconomic History  . Marital status: Legally Separated    Spouse name: Not on file  . Number of children: Not on file  . Years of education: Not on file  . Highest education level: Not on file  Occupational History  . Not on file  Social Needs  . Financial resource strain: Not on file  . Food insecurity:    Worry: Not on file    Inability: Not on file  . Transportation needs:    Medical: Not on file    Non-medical: Not on file  Tobacco Use  . Smoking status: Former Smoker    Last attempt to quit: 12/15/2003    Years since quitting: 14.1  . Smokeless tobacco: Current User    Types: Snuff  . Tobacco comment: 3 times per day  Substance and Sexual Activity  . Alcohol use: Not on file  . Drug use: Not on file  . Sexual activity: Not on file  Lifestyle  . Physical activity:    Days per week: Not on file    Minutes per session: Not on file  . Stress: Not on file  Relationships  . Social connections:    Talks on phone: Not on file    Gets together: Not on file    Attends religious service: Not on file    Active member of club or organization: Not on file    Attends meetings of clubs or organizations: Not on file    Relationship status: Not on file  Other Topics Concern  . Not on file  Social History Narrative  . Not on file    Her Allergies Are:  Allergies  Allergen Reactions  . Codeine Other (See Comments)    Breaks out in a rash  :   Her Current Medications Are:  Outpatient Encounter Medications as  of 01/22/2018  Medication Sig  . acetaminophen (TYLENOL) 500 MG tablet Take 1,000 mg by mouth daily as needed for mild pain.  Derrill Memo ON 01/31/2018] Adalimumab 40 MG/0.8ML PNKT Inject into the skin.  Marland Kitchen albuterol (VENTOLIN HFA) 108 (90 Base) MCG/ACT inhaler Inhale 2 puffs into the lungs every 6 (six) hours as needed.  . benzonatate (TESSALON) 100 MG capsule Take 1 capsule (100 mg total) by mouth every 8 (eight) hours.  . busPIRone (BUSPAR) 10 MG tablet Take 10 mg by mouth 2 (two) times  daily.  . DUREZOL 0.05 % EMUL Place 1 drop into both eyes 4 (four) times daily.  . ferrous gluconate (FERGON) 324 MG tablet Take 324 mg by mouth 2 (two) times daily.   . folic acid (FOLVITE) 1 MG tablet Take 1 mg by mouth daily.  . furosemide (LASIX) 40 MG tablet Take 40 mg by mouth daily.  Marland Kitchen levothyroxine (SYNTHROID, LEVOTHROID) 50 MCG tablet Take 50 mcg by mouth daily before breakfast.  . lisinopril-hydrochlorothiazide (PRINZIDE,ZESTORETIC) 20-12.5 MG per tablet Take 1 tablet by mouth daily.  . methotrexate 2.5 MG tablet Take 15 mg by mouth once a week.  Marland Kitchen omeprazole (PRILOSEC) 20 MG capsule Take 20 mg by mouth daily.  . potassium chloride (K-DUR,KLOR-CON) 10 MEQ tablet Take 10 mEq by mouth daily.  . pravastatin (PRAVACHOL) 40 MG tablet Take 40 mg by mouth daily.  . rivaroxaban (XARELTO) 20 MG TABS tablet Take by mouth every evening.   . sertraline (ZOLOFT) 100 MG tablet Take 50 mg by mouth daily.   Marland Kitchen tiZANidine (ZANAFLEX) 4 MG tablet Take 4 mg by mouth every 6 (six) hours as needed for muscle spasms.  . [DISCONTINUED] cephALEXin (KEFLEX) 500 MG capsule Take 1 capsule (500 mg total) by mouth 2 (two) times daily.   No facility-administered encounter medications on file as of 01/22/2018.   :  Review of Systems:  Out of a complete 14 point review of systems, all are reviewed and negative with the exception of these symptoms as listed below: Review of Systems  Neurological:       Pt presents today to discuss her  auto pap, even though she was instructed on how to use it.    Objective:  Neurological Exam  Physical Exam Physical Examination:   Vitals:   01/22/18 1411  BP: 109/62  Pulse: (!) 103   General Examination: The patient is a very pleasant 62 y.o. female in no acute distress. She appears well-developed and well-nourished and well groomed.   HEENT: Normocephalic, atraumatic, pupils are equal, round and reactive to light and accommodation. corrective eye glasses. Extraocular tracking is good without limitation to gaze excursion or nystagmus noted. Normal smooth pursuit is noted. Hearing is grossly intact. Face is symmetric with normal facial animation and normal facial sensation. Speech is clear with no dysarthria noted. There is no hypophonia. There is no lip, neck/head, jaw or voice tremor. Neck shows FROM. Oropharynx exam reveals: moderate mouth dryness, edentulous state and mild airway crowding. Mallampati is class II. Tongue protrudes centrally and palate elevates symmetrically. Tonsils are absent.   Chest: Clear to auscultation without wheezing, rhonchi or crackles noted.  Heart: S1+S2+0, regular and normal without murmurs, rubs or gallops noted.   Abdomen: Soft, non-tender and non-distended with normal bowel sounds appreciated on auscultation.  Extremities: There is no pitting edema in the distal lower extremities bilaterally.   Skin: Warm and dry without trophic changes noted. There are no varicose veins.  Musculoskeletal: exam reveals no obvious joint deformities, tenderness or joint swelling or erythema, except L knee pain.   Neurologically:  Mental status: The patient is awake, alert and oriented in all 4 spheres. Her immediate and remote memory, attention, language skills and fund of knowledge are appropriate. There is no evidence of aphasia, agnosia, apraxia or anomia. Speech is clear with normal prosody and enunciation. Thought process is linear. Mood is normal and  affect is normal.  Cranial nerves II - XII are as described above under HEENT exam. In addition: shoulder shrug is  normal with equal shoulder height noted. Motor exam: Normal bulk, strength and tone is noted. There is no drift, tremor or rebound. Fine motor skills and coordination: grossly intact.  Cerebellar testing: No dysmetria or intention tremor. There is no truncal or gait ataxia.  Sensory exam: intact to light touch in the upper and lower extremities.  Gait, station and balance: She stands easily. No veering to one side is noted. No leaning to one side is noted. Posture is age-appropriate and stance is narrow based. Gait shows normal stride length and normal pace. No problems turning are noted.  Assessment and Plan:  In summary, Jennifer Foley is a very pleasant 62 year old female with an underlying complex medical history of hypertension, hyperlipidemia, hyperthyroidism, reflux disease, diabetes, depression, anxiety, anemia, A. fib, chronic kidney disease, congestive heart failure, asthma, and obesity, who presents for follow-up consultation of her obstructive sleep apnea. She had a baseline sleep study on 10/09/2014 which showed overall borderline or mild obstructive sleep apnea, however, there was absence of supine sleep and REM sleep during the study. She was advised to try AutoPap therapy at home. She has not been able to be compliant with AutoPap as yet. She needs to make an appointment with her DME company for reeducation on how to use her AutoPap machine. She also will need a follow up appointment soon in the next 4-5 weeks for a compliance recheck. I explained to the patient the importance of being compliant with AutoPap therapy and the reasons for offering her treatment for sleep apnea. I answered all her questions today and she was in agreement. I spent 15 minutes in total face-to-face time with the patient, more than 50% of which was spent in counseling and coordination of care,  reviewing test results, reviewing medication and discussing or reviewing the diagnosis of OSA, its prognosis and treatment options. Pertinent laboratory and imaging test results that were available during this visit with the patient were reviewed by me and considered in my medical decision making (see chart for details).

## 2018-01-22 NOTE — Patient Instructions (Addendum)
Please call [employer] at [phone], and press option 1 when prompted. Their customer service representatives will be glad to assist you. If they are unable to answer, please leave a message and they will call you back. Make sure to leave your name and return phone number. Please call them to set up an appointment to re-learn how to use your autoPAP machine.  Your insurance likely requires that you use your autoPAP at least 4 hours each night on 70% of the nights, I recommend, that you not skip any nights and use it throughout the night if you can. Getting used to PAP and staying with the treatment long term does take time and patience and discipline. Untreated obstructive sleep apnea when it is moderate to severe can have an adverse impact on cardiovascular health and raise her risk for heart disease, arrhythmias, hypertension, congestive heart failure, stroke and diabetes. Untreated obstructive sleep apnea causes sleep disruption, nonrestorative sleep, and sleep deprivation. This can have an impact on your day to day functioning and cause daytime sleepiness and impairment of cognitive function, memory loss, mood disturbance, and problems focussing. Using PAP regularly can improve these symptoms.

## 2018-03-13 NOTE — Progress Notes (Deleted)
[facility]  PATIENT: Jennifer Foley DOB: December 21, 1955   REASON FOR VISIT: *** HISTORY FROM:    HISTORY OF PRESENT ILLNESS: Jennifer Foley is a 62 year old right-handed woman with an underlying medical history of hypertension, hyperlipidemia, hyperthyroidism, reflux disease, diabetes, depression, anxiety, anemia, A. fib, chronic kidney disease, congestive heart failure, asthma, and obesity, who presents for follow-up consultation of her obstructive sleep apnea, recent sleep study testing and starting AutoPap therapy. The patient is unaccompanied today. I first met her on 08/07/2017 at the request of her primary care provider, at which time she reported snoring and daytime somnolence as well as waking up at night with shortness of breath. She was invited for sleep study. She had a baseline sleep study on 10/09/2017. I went over her test results with her in detail today. Sleep efficiency was 61.6%, sleep latency 30.5 minutes and REM sleep was absent. She had an increased percentage of stage II sleep, slow-wave sleep was 18.1%, REM sleep was absent. She had mild snoring. Total AHI was 5.8 per hour, she did not achieve supine sleep. Average oxygen saturation was 98%, nadir was 87%, she did not have any significant PLMS. Based on her medical history and sleep related complaints I suggested home treatment with AutoPap therapy. She was also advised that absence of supine sleep and absence of REM sleep during the sleep study with likely underestimated her sleep disordered breathing.  Today, 01/22/2018: I reviewed her AutoPap compliance data from 12/22/2017 through 01/20/2018 which is a total of 30 days, during which time she used her AutoPap only 3 days, indicating noncompliance. In the past 57 days from 11/25/2017 through 01/20/2018 she used her AutoPap about 8 days, again indicating noncompliance. She reports that she has had difficulty using her AutoPap she was not sure how to use it  even though she was told how to use it by her DME company. She would be willing to make another appointment for reeducation and relearning   REVIEW OF SYSTEMS: Full 14 system review of systems performed and notable only for those listed, all others are neg:  Constitutional: neg  Cardiovascular: neg Ear/Nose/Throat: neg  Skin: neg Eyes: neg Respiratory: neg Gastroitestinal: neg  Hematology/Lymphatic: neg  Endocrine: neg Musculoskeletal:neg Allergy/Immunology: neg Neurological: neg Psychiatric: neg Sleep : neg   ALLERGIES: Allergies  Allergen Reactions  . Codeine Other (See Comments)    Breaks out in a rash    HOME MEDICATIONS: Outpatient Medications Prior to Visit  Medication Sig Dispense Refill  . acetaminophen (TYLENOL) 500 MG tablet Take 1,000 mg by mouth daily as needed for mild pain.    . Adalimumab 40 MG/0.8ML PNKT Inject into the skin.    Marland Kitchen albuterol (VENTOLIN HFA) 108 (90 Base) MCG/ACT inhaler Inhale 2 puffs into the lungs every 6 (six) hours as needed.    . benzonatate (TESSALON) 100 MG capsule Take 1 capsule (100 mg total) by mouth every 8 (eight) hours. 21 capsule 0  . busPIRone (BUSPAR) 10 MG tablet Take 10 mg by mouth 2 (two) times daily.    . DUREZOL 0.05 % EMUL Place 1 drop into both eyes 4 (four) times daily.    . ferrous gluconate (FERGON) 324 MG tablet Take 324 mg by mouth 2 (two) times daily.     . folic acid (FOLVITE) 1 MG tablet Take 1 mg by mouth daily.    . furosemide (LASIX) 40 MG tablet Take 40 mg by mouth daily.    Marland Kitchen levothyroxine (SYNTHROID, LEVOTHROID) 50 MCG  tablet Take 50 mcg by mouth daily before breakfast.    . lisinopril-hydrochlorothiazide (PRINZIDE,ZESTORETIC) 20-12.5 MG per tablet Take 1 tablet by mouth daily.    . methotrexate 2.5 MG tablet Take 15 mg by mouth once a week.    Marland Kitchen omeprazole (PRILOSEC) 20 MG capsule Take 20 mg by mouth daily.    . potassium chloride (K-DUR,KLOR-CON) 10 MEQ tablet Take 10 mEq by mouth daily.    . pravastatin  (PRAVACHOL) 40 MG tablet Take 40 mg by mouth daily.    . rivaroxaban (XARELTO) 20 MG TABS tablet Take by mouth every evening.     . sertraline (ZOLOFT) 100 MG tablet Take 50 mg by mouth daily.     Marland Kitchen tiZANidine (ZANAFLEX) 4 MG tablet Take 4 mg by mouth every 6 (six) hours as needed for muscle spasms.     No facility-administered medications prior to visit.     PAST MEDICAL HISTORY: Past Medical History:  Diagnosis Date  . Anemia   . Atrial fibrillation ([place])   . Bilateral breast cysts   . CHF (congestive heart failure) ([place])   . Chronic kidney disease    stage 3  . Depression with anxiety   . DM (diabetes mellitus) ([place])   . Esophageal reflux   . HTN (hypertension)   . Hyperlipemia   . Hyperthyroidism   . INR (international normal ratio) abnormal   . MRSA (methicillin resistant staph aureus) culture positive   . Pneumonia   . PTSD (post-traumatic stress disorder)     PAST SURGICAL HISTORY: Past Surgical History:  Procedure Laterality Date  . ABDOMINAL HYSTERECTOMY    . BREAST BIOPSY    . CATARACT EXTRACTION  2018  . CESAREAN SECTION    . DENTAL SURGERY    . EYE SURGERY    . FOOT SURGERY    . HERNIA REPAIR    . KNEE SURGERY    . TONSILLECTOMY AND ADENOIDECTOMY      FAMILY HISTORY: Family History  Problem Relation Age of Onset  . Alcoholism Father   . Arthritis Father   . Hypertension Father        sister, mother  . Diabetes Mellitus I Mother        sister  . Renal Disease Mother   . Stroke Mother   . Epilepsy Brother     SOCIAL HISTORY: Social History   Socioeconomic History  . Marital status: Legally Separated    Spouse name: Not on file  . Number of children: Not on file  . Years of education: Not on file  . Highest education level: Not on file  Occupational History  . Not on file  Social Needs  . Financial resource strain: Not on file  . Food insecurity:    Worry: Not on file    Inability: Not on file  . Transportation needs:    Medical: Not  on file    Non-medical: Not on file  Tobacco Use  . Smoking status: Former Smoker    Last attempt to quit: 12/15/2003    Years since quitting: 14.2  . Smokeless tobacco: Current User    Types: Snuff  . Tobacco comment: 3 times per day  Substance and Sexual Activity  . Alcohol use: Not on file  . Drug use: Not on file  . Sexual activity: Not on file  Lifestyle  . Physical activity:    Days per week: Not on file    Minutes per session: Not on file  .  Stress: Not on file  Relationships  . Social connections:    Talks on phone: Not on file    Gets together: Not on file    Attends religious service: Not on file    Active member of club or organization: Not on file    Attends meetings of clubs or organizations: Not on file    Relationship status: Not on file  . Intimate partner violence:    Fear of current or ex partner: Not on file    Emotionally abused: Not on file    Physically abused: Not on file    Forced sexual activity: Not on file  Other Topics Concern  . Not on file  Social History Narrative  . Not on file     PHYSICAL EXAM  There were no vitals filed for this visit. There is no height or weight on file to calculate BMI.  Generalized: Well developed, in no acute distress  Head: normocephalic and atraumatic,. Oropharynx benign  Neck: Supple, no carotid bruits  Cardiac: Regular rate rhythm, no murmur  Musculoskeletal: No deformity   Neurological examination   Mentation: Alert oriented to time, place, history taking. Attention span and concentration appropriate. Recent and remote memory intact.  Follows all commands speech and language fluent.   Cranial nerve II-XII: Fundoscopic exam reveals sharp disc margins.Pupils were equal round reactive to light extraocular movements were full, visual field were full on confrontational test. Facial sensation and strength were normal. hearing was intact to finger rubbing bilaterally. Uvula tongue midline. head turning and  shoulder shrug were normal and symmetric.Tongue protrusion into cheek strength was normal. Motor: normal bulk and tone, full strength in the BUE, BLE, fine finger movements normal, no pronator drift. No focal weakness Sensory: normal and symmetric to light touch, pinprick, and  Vibration, proprioception  Coordination: finger-nose-finger, heel-to-shin bilaterally, no dysmetria Reflexes: Brachioradialis 2/2, biceps 2/2, triceps 2/2, patellar 2/2, Achilles 2/2, plantar responses were flexor bilaterally. Gait and Station: Rising up from seated position without assistance, normal stance,  moderate stride, good arm swing, smooth turning, able to perform tiptoe, and heel walking without difficulty. Tandem gait is steady  DIAGNOSTIC DATA (LABS, IMAGING, TESTING) - I reviewed patient records, labs, notes, testing and imaging myself where available.  Lab Results  Component Value Date   WBC 13.1 (H) 11/16/2017   HGB 9.6 (L) 11/16/2017   HCT 29.7 (L) 11/16/2017   MCV 87.6 11/16/2017   PLT 265 11/16/2017      Component Value Date/Time   NA 138 11/16/2017 1233   NA 145 (H) 10/26/2017 1609   K 3.4 (L) 11/16/2017 1233   CL 103 11/16/2017 1233   CO2 26 11/16/2017 1233   GLUCOSE 166 (H) 11/16/2017 1233   BUN 32 (H) 11/16/2017 1233   BUN 19 10/26/2017 1609   CREATININE 1.56 (H) 11/16/2017 1233   CALCIUM 8.8 (L) 11/16/2017 1233   PROT 7.3 11/16/2017 1233   ALBUMIN 3.3 (L) 11/16/2017 1233   AST 19 11/16/2017 1233   ALT 10 (L) 11/16/2017 1233   ALKPHOS 69 11/16/2017 1233   BILITOT 0.4 11/16/2017 1233   GFRNONAA 35 (L) 11/16/2017 1233   GFRAA 40 (L) 11/16/2017 1233   No results found for: CHOL, HDL, LDLCALC, LDLDIRECT, TRIG, CHOLHDL No results found for: HGBA1C No results found for: VITAMINB12 No results found for: TSH  ***  ASSESSMENT AND PLAN   Lashun Williamsis a very pleasant 9 year oldfemalewith an underlying complex medical history of hypertension, hyperlipidemia, hyperthyroidism,  reflux  disease, diabetes, depression, anxiety, anemia, A. fib, chronic kidney disease, congestive heart failure, asthma, and obesity, who presents for follow-up consultation of her obstructive sleep apnea. She had a baseline sleep study on 10/09/2014 which showed overall borderline or mild obstructive sleep apnea, however, there was absence of supine sleep and REM sleep during the study. She was advised to try AutoPap therapy at home. She has not been able to be compliant with AutoPap as yet. She needs to make an appointment with her DME company for reeducation on how to use her AutoPap machine. She also will need a follow up appointment soon in the next 4-5 weeks for a compliance recheck. I explained to the patient the importance of being compliant with AutoPap therapy and the reasons for offering her treatment for sleep apnea. I answered all her questions today and she was in agreement. I spent 15 minutes in total face-to-face time with the patient, more than 50% of which was spent in counseling and coordination    Dennie Bible, [facility], [facility], APRN  [facility] [address] [place], [place] [address] [phone]

## 2018-03-14 ENCOUNTER — Telehealth: Payer: Self-pay | Admitting: *Deleted

## 2018-03-14 ENCOUNTER — Ambulatory Visit: Payer: Medicare Other | Admitting: Nurse Practitioner

## 2018-03-14 NOTE — Telephone Encounter (Signed)
Patient called today to cancel her follow up today, not feeling well. She rescheduled for next week.

## 2018-03-15 ENCOUNTER — Encounter: Payer: Self-pay | Admitting: Nurse Practitioner

## 2018-03-19 NOTE — Progress Notes (Deleted)
[facility]  PATIENT: Jennifer Foley DOB: December 21, 1955   REASON FOR VISIT: *** HISTORY FROM:    HISTORY OF PRESENT ILLNESS: [name] is a 62 year old right-handed woman with an underlying medical history of hypertension, hyperlipidemia, hyperthyroidism, reflux disease, diabetes, depression, anxiety, anemia, A. fib, chronic kidney disease, congestive heart failure, asthma, and obesity, who presents for follow-up consultation of her obstructive sleep apnea, recent sleep study testing and starting AutoPap therapy. The patient is unaccompanied today. I first met her on 08/07/2017 at the request of her primary care provider, at which time she reported snoring and daytime somnolence as well as waking up at night with shortness of breath. She was invited for sleep study. She had a baseline sleep study on 10/09/2017. I went over her test results with her in detail today. Sleep efficiency was 61.6%, sleep latency 30.5 minutes and REM sleep was absent. She had an increased percentage of stage II sleep, slow-wave sleep was 18.1%, REM sleep was absent. She had mild snoring. Total AHI was 5.8 per hour, she did not achieve supine sleep. Average oxygen saturation was 98%, nadir was 87%, she did not have any significant PLMS. Based on her medical history and sleep related complaints I suggested home treatment with AutoPap therapy. She was also advised that absence of supine sleep and absence of REM sleep during the sleep study with likely underestimated her sleep disordered breathing.  Today, 01/22/2018: I reviewed her AutoPap compliance data from 12/22/2017 through 01/20/2018 which is a total of 30 days, during which time she used her AutoPap only 3 days, indicating noncompliance. In the past 57 days from 11/25/2017 through 01/20/2018 she used her AutoPap about 8 days, again indicating noncompliance. She reports that she has had difficulty using her AutoPap she was not sure how to use it  even though she was told how to use it by her DME company. She would be willing to make another appointment for reeducation and relearning   REVIEW OF SYSTEMS: Full 14 system review of systems performed and notable only for those listed, all others are neg:  Constitutional: neg  Cardiovascular: neg Ear/Nose/Throat: neg  Skin: neg Eyes: neg Respiratory: neg Gastroitestinal: neg  Hematology/Lymphatic: neg  Endocrine: neg Musculoskeletal:neg Allergy/Immunology: neg Neurological: neg Psychiatric: neg Sleep : neg   ALLERGIES: Allergies  Allergen Reactions  . Codeine Other (See Comments)    Breaks out in a rash    HOME MEDICATIONS: Outpatient Medications Prior to Visit  Medication Sig Dispense Refill  . acetaminophen (TYLENOL) 500 MG tablet Take 1,000 mg by mouth daily as needed for mild pain.    . Adalimumab 40 MG/0.8ML PNKT Inject into the skin.    Marland Kitchen albuterol (VENTOLIN HFA) 108 (90 Base) MCG/ACT inhaler Inhale 2 puffs into the lungs every 6 (six) hours as needed.    . benzonatate (TESSALON) 100 MG capsule Take 1 capsule (100 mg total) by mouth every 8 (eight) hours. 21 capsule 0  . busPIRone (BUSPAR) 10 MG tablet Take 10 mg by mouth 2 (two) times daily.    . DUREZOL 0.05 % EMUL Place 1 drop into both eyes 4 (four) times daily.    . ferrous gluconate (FERGON) 324 MG tablet Take 324 mg by mouth 2 (two) times daily.     . folic acid (FOLVITE) 1 MG tablet Take 1 mg by mouth daily.    . furosemide (LASIX) 40 MG tablet Take 40 mg by mouth daily.    Marland Kitchen levothyroxine (SYNTHROID, LEVOTHROID) 50 MCG  tablet Take 50 mcg by mouth daily before breakfast.    . lisinopril-hydrochlorothiazide (PRINZIDE,ZESTORETIC) 20-12.5 MG per tablet Take 1 tablet by mouth daily.    . methotrexate 2.5 MG tablet Take 15 mg by mouth once a week.    Marland Kitchen omeprazole (PRILOSEC) 20 MG capsule Take 20 mg by mouth daily.    . potassium chloride (K-DUR,KLOR-CON) 10 MEQ tablet Take 10 mEq by mouth daily.    . pravastatin  (PRAVACHOL) 40 MG tablet Take 40 mg by mouth daily.    . rivaroxaban (XARELTO) 20 MG TABS tablet Take by mouth every evening.     . sertraline (ZOLOFT) 100 MG tablet Take 50 mg by mouth daily.     Marland Kitchen tiZANidine (ZANAFLEX) 4 MG tablet Take 4 mg by mouth every 6 (six) hours as needed for muscle spasms.     No facility-administered medications prior to visit.     PAST MEDICAL HISTORY: Past Medical History:  Diagnosis Date  . Anemia   . Atrial fibrillation ([place])   . Bilateral breast cysts   . CHF (congestive heart failure) ([place])   . Chronic kidney disease    stage 3  . Depression with anxiety   . DM (diabetes mellitus) ([place])   . Esophageal reflux   . HTN (hypertension)   . Hyperlipemia   . Hyperthyroidism   . INR (international normal ratio) abnormal   . MRSA (methicillin resistant staph aureus) culture positive   . Pneumonia   . PTSD (post-traumatic stress disorder)     PAST SURGICAL HISTORY: Past Surgical History:  Procedure Laterality Date  . ABDOMINAL HYSTERECTOMY    . BREAST BIOPSY    . CATARACT EXTRACTION  2018  . CESAREAN SECTION    . DENTAL SURGERY    . EYE SURGERY    . FOOT SURGERY    . HERNIA REPAIR    . KNEE SURGERY    . TONSILLECTOMY AND ADENOIDECTOMY      FAMILY HISTORY: Family History  Problem Relation Age of Onset  . Alcoholism Father   . Arthritis Father   . Hypertension Father        sister, mother  . Diabetes Mellitus I Mother        sister  . Renal Disease Mother   . Stroke Mother   . Epilepsy Brother     SOCIAL HISTORY: Social History   Socioeconomic History  . Marital status: Legally Separated    Spouse name: Not on file  . Number of children: Not on file  . Years of education: Not on file  . Highest education level: Not on file  Occupational History  . Not on file  Social Needs  . Financial resource strain: Not on file  . Food insecurity:    Worry: Not on file    Inability: Not on file  . Transportation needs:    Medical: Not  on file    Non-medical: Not on file  Tobacco Use  . Smoking status: Former Smoker    Last attempt to quit: 12/15/2003    Years since quitting: 14.2  . Smokeless tobacco: Current User    Types: Snuff  . Tobacco comment: 3 times per day  Substance and Sexual Activity  . Alcohol use: Not on file  . Drug use: Not on file  . Sexual activity: Not on file  Lifestyle  . Physical activity:    Days per week: Not on file    Minutes per session: Not on file  .  Stress: Not on file  Relationships  . Social connections:    Talks on phone: Not on file    Gets together: Not on file    Attends religious service: Not on file    Active member of club or organization: Not on file    Attends meetings of clubs or organizations: Not on file    Relationship status: Not on file  . Intimate partner violence:    Fear of current or ex partner: Not on file    Emotionally abused: Not on file    Physically abused: Not on file    Forced sexual activity: Not on file  Other Topics Concern  . Not on file  Social History Narrative  . Not on file     PHYSICAL EXAM  There were no vitals filed for this visit. There is no height or weight on file to calculate BMI.  Generalized: Well developed, in no acute distress  Head: normocephalic and atraumatic,. Oropharynx benign  Neck: Supple, no carotid bruits  Cardiac: Regular rate rhythm, no murmur  Musculoskeletal: No deformity   Neurological examination   Mentation: Alert oriented to time, place, history taking. Attention span and concentration appropriate. Recent and remote memory intact.  Follows all commands speech and language fluent.   Cranial nerve II-XII: Fundoscopic exam reveals sharp disc margins.Pupils were equal round reactive to light extraocular movements were full, visual field were full on confrontational test. Facial sensation and strength were normal. hearing was intact to finger rubbing bilaterally. Uvula tongue midline. head turning and  shoulder shrug were normal and symmetric.Tongue protrusion into cheek strength was normal. Motor: normal bulk and tone, full strength in the BUE, BLE, fine finger movements normal, no pronator drift. No focal weakness Sensory: normal and symmetric to light touch, pinprick, and  Vibration, proprioception  Coordination: finger-nose-finger, heel-to-shin bilaterally, no dysmetria Reflexes: Brachioradialis 2/2, biceps 2/2, triceps 2/2, patellar 2/2, Achilles 2/2, plantar responses were flexor bilaterally. Gait and Station: Rising up from seated position without assistance, normal stance,  moderate stride, good arm swing, smooth turning, able to perform tiptoe, and heel walking without difficulty. Tandem gait is steady  DIAGNOSTIC DATA (LABS, IMAGING, TESTING) - I reviewed patient records, labs, notes, testing and imaging myself where available.  Lab Results  Component Value Date   WBC 13.1 (H) 11/16/2017   HGB 9.6 (L) 11/16/2017   HCT 29.7 (L) 11/16/2017   MCV 87.6 11/16/2017   PLT 265 11/16/2017      Component Value Date/Time   NA 138 11/16/2017 1233   NA 145 (H) 10/26/2017 1609   K 3.4 (L) 11/16/2017 1233   CL 103 11/16/2017 1233   CO2 26 11/16/2017 1233   GLUCOSE 166 (H) 11/16/2017 1233   BUN 32 (H) 11/16/2017 1233   BUN 19 10/26/2017 1609   CREATININE 1.56 (H) 11/16/2017 1233   CALCIUM 8.8 (L) 11/16/2017 1233   PROT 7.3 11/16/2017 1233   ALBUMIN 3.3 (L) 11/16/2017 1233   AST 19 11/16/2017 1233   ALT 10 (L) 11/16/2017 1233   ALKPHOS 69 11/16/2017 1233   BILITOT 0.4 11/16/2017 1233   GFRNONAA 35 (L) 11/16/2017 1233   GFRAA 40 (L) 11/16/2017 1233   No results found for: CHOL, HDL, LDLCALC, LDLDIRECT, TRIG, CHOLHDL No results found for: HGBA1C No results found for: VITAMINB12 No results found for: TSH  ***  ASSESSMENT AND PLAN   Lashun Williamsis a very pleasant 9 year oldfemalewith an underlying complex medical history of hypertension, hyperlipidemia, hyperthyroidism,  reflux  disease, diabetes, depression, anxiety, anemia, A. fib, chronic kidney disease, congestive heart failure, asthma, and obesity, who presents for follow-up consultation of her obstructive sleep apnea. She had a baseline sleep study on 10/09/2014 which showed overall borderline or mild obstructive sleep apnea, however, there was absence of supine sleep and REM sleep during the study. She was advised to try AutoPap therapy at home. She has not been able to be compliant with AutoPap as yet. She needs to make an appointment with her DME company for reeducation on how to use her AutoPap machine. She also will need a follow up appointment soon in the next 4-5 weeks for a compliance recheck. I explained to the patient the importance of being compliant with AutoPap therapy and the reasons for offering her treatment for sleep apnea. I answered all her questions today and she was in agreement. I spent 15 minutes in total face-to-face time with the patient, more than 50% of which was spent in counseling and coordination    Dennie Bible, [facility], [facility], APRN  [facility] [address] [place], [place] [address] [phone]

## 2018-03-20 ENCOUNTER — Ambulatory Visit: Payer: Medicare Other | Admitting: Nurse Practitioner

## 2018-03-20 ENCOUNTER — Telehealth: Payer: Self-pay | Admitting: *Deleted

## 2018-03-20 NOTE — Telephone Encounter (Signed)
Patient was no show for follow up with NP today.  

## 2018-03-21 ENCOUNTER — Encounter: Payer: Self-pay | Admitting: Nurse Practitioner

## 2018-04-22 ENCOUNTER — Ambulatory Visit (HOSPITAL_COMMUNITY): Payer: Medicare Other | Admitting: Psychiatry

## 2018-07-02 ENCOUNTER — Ambulatory Visit (HOSPITAL_COMMUNITY): Payer: Self-pay | Admitting: Psychiatry

## 2018-08-12 NOTE — Progress Notes (Signed)
  Patient was here for CPAP compliance however she has only used machine for 1 day.  According to [employer] patient did their office yesterday and get new mask and headgear refitted.  She suggests that patient wear it for 31 days and make a follow-up appointment.  She must wear it greater than 4 hours to be compliant for her insurance.  This will be a no charge visit Dennie Bible, [facility], [facility], APRN

## 2018-08-13 ENCOUNTER — Encounter: Payer: Self-pay | Admitting: Nurse Practitioner

## 2018-08-13 ENCOUNTER — Ambulatory Visit (INDEPENDENT_AMBULATORY_CARE_PROVIDER_SITE_OTHER): Payer: Medicare Other | Admitting: Nurse Practitioner

## 2018-08-13 DIAGNOSIS — G4733 Obstructive sleep apnea (adult) (pediatric): Secondary | ICD-10-CM

## 2018-08-13 DIAGNOSIS — Z9989 Dependence on other enabling machines and devices: Secondary | ICD-10-CM

## 2018-08-17 ENCOUNTER — Other Ambulatory Visit: Payer: Self-pay | Admitting: Orthopedic Surgery

## 2018-08-17 DIAGNOSIS — R609 Edema, unspecified: Secondary | ICD-10-CM

## 2018-08-17 DIAGNOSIS — M2560 Stiffness of unspecified joint, not elsewhere classified: Secondary | ICD-10-CM

## 2018-08-17 DIAGNOSIS — R52 Pain, unspecified: Secondary | ICD-10-CM

## 2018-08-21 ENCOUNTER — Other Ambulatory Visit: Payer: Self-pay | Admitting: Family Medicine

## 2018-08-21 DIAGNOSIS — Z1231 Encounter for screening mammogram for malignant neoplasm of breast: Secondary | ICD-10-CM

## 2018-08-27 ENCOUNTER — Other Ambulatory Visit: Payer: Self-pay | Admitting: Student

## 2018-08-31 ENCOUNTER — Emergency Department (HOSPITAL_COMMUNITY): Payer: Medicare Other

## 2018-08-31 ENCOUNTER — Inpatient Hospital Stay (HOSPITAL_COMMUNITY): Payer: Medicare Other

## 2018-08-31 ENCOUNTER — Other Ambulatory Visit: Payer: Self-pay

## 2018-08-31 ENCOUNTER — Encounter (HOSPITAL_COMMUNITY): Payer: Self-pay

## 2018-08-31 ENCOUNTER — Inpatient Hospital Stay (HOSPITAL_COMMUNITY)
Admission: EM | Admit: 2018-08-31 | Discharge: 2018-09-03 | DRG: 872 | Disposition: A | Discharge status: Home / Self Care | Payer: Medicare Other | Attending: Family Medicine | Admitting: Family Medicine

## 2018-08-31 DIAGNOSIS — H44119 Panuveitis, unspecified eye: Secondary | ICD-10-CM | POA: Diagnosis present

## 2018-08-31 DIAGNOSIS — Z794 Long term (current) use of insulin: Secondary | ICD-10-CM

## 2018-08-31 DIAGNOSIS — A4151 Sepsis due to Escherichia coli [E. coli]: Principal | ICD-10-CM | POA: Diagnosis present

## 2018-08-31 DIAGNOSIS — I10 Essential (primary) hypertension: Secondary | ICD-10-CM

## 2018-08-31 DIAGNOSIS — M549 Dorsalgia, unspecified: Secondary | ICD-10-CM

## 2018-08-31 DIAGNOSIS — Z79890 Hormone replacement therapy (postmenopausal): Secondary | ICD-10-CM

## 2018-08-31 DIAGNOSIS — D631 Anemia in chronic kidney disease: Secondary | ICD-10-CM | POA: Diagnosis present

## 2018-08-31 DIAGNOSIS — N184 Chronic kidney disease, stage 4 (severe): Secondary | ICD-10-CM | POA: Diagnosis present

## 2018-08-31 DIAGNOSIS — Z888 Allergy status to other drugs, medicaments and biological substances status: Secondary | ICD-10-CM

## 2018-08-31 DIAGNOSIS — N179 Acute kidney failure, unspecified: Secondary | ICD-10-CM | POA: Diagnosis present

## 2018-08-31 DIAGNOSIS — H209 Unspecified iridocyclitis: Secondary | ICD-10-CM | POA: Diagnosis present

## 2018-08-31 DIAGNOSIS — Z8614 Personal history of Methicillin resistant Staphylococcus aureus infection: Secondary | ICD-10-CM | POA: Diagnosis not present

## 2018-08-31 DIAGNOSIS — E785 Hyperlipidemia, unspecified: Secondary | ICD-10-CM | POA: Diagnosis present

## 2018-08-31 DIAGNOSIS — E039 Hypothyroidism, unspecified: Secondary | ICD-10-CM | POA: Diagnosis present

## 2018-08-31 DIAGNOSIS — E0590 Thyrotoxicosis, unspecified without thyrotoxic crisis or storm: Secondary | ICD-10-CM | POA: Diagnosis present

## 2018-08-31 DIAGNOSIS — N183 Chronic kidney disease, stage 3 unspecified: Secondary | ICD-10-CM

## 2018-08-31 DIAGNOSIS — I509 Heart failure, unspecified: Secondary | ICD-10-CM | POA: Diagnosis present

## 2018-08-31 DIAGNOSIS — R6520 Severe sepsis without septic shock: Secondary | ICD-10-CM | POA: Diagnosis not present

## 2018-08-31 DIAGNOSIS — E1122 Type 2 diabetes mellitus with diabetic chronic kidney disease: Secondary | ICD-10-CM | POA: Diagnosis present

## 2018-08-31 DIAGNOSIS — Z7901 Long term (current) use of anticoagulants: Secondary | ICD-10-CM

## 2018-08-31 DIAGNOSIS — K219 Gastro-esophageal reflux disease without esophagitis: Secondary | ICD-10-CM | POA: Diagnosis present

## 2018-08-31 DIAGNOSIS — J449 Chronic obstructive pulmonary disease, unspecified: Secondary | ICD-10-CM | POA: Diagnosis present

## 2018-08-31 DIAGNOSIS — E669 Obesity, unspecified: Secondary | ICD-10-CM

## 2018-08-31 DIAGNOSIS — I480 Paroxysmal atrial fibrillation: Secondary | ICD-10-CM | POA: Diagnosis present

## 2018-08-31 DIAGNOSIS — F418 Other specified anxiety disorders: Secondary | ICD-10-CM | POA: Diagnosis present

## 2018-08-31 DIAGNOSIS — I130 Hypertensive heart and chronic kidney disease with heart failure and stage 1 through stage 4 chronic kidney disease, or unspecified chronic kidney disease: Secondary | ICD-10-CM | POA: Diagnosis present

## 2018-08-31 DIAGNOSIS — N10 Acute tubulo-interstitial nephritis: Secondary | ICD-10-CM | POA: Diagnosis present

## 2018-08-31 DIAGNOSIS — Z885 Allergy status to narcotic agent status: Secondary | ICD-10-CM

## 2018-08-31 DIAGNOSIS — D72829 Elevated white blood cell count, unspecified: Secondary | ICD-10-CM | POA: Diagnosis not present

## 2018-08-31 DIAGNOSIS — F4310 Post-traumatic stress disorder, unspecified: Secondary | ICD-10-CM | POA: Diagnosis present

## 2018-08-31 DIAGNOSIS — F329 Major depressive disorder, single episode, unspecified: Secondary | ICD-10-CM | POA: Diagnosis present

## 2018-08-31 DIAGNOSIS — Z6841 Body Mass Index (BMI) 40.0 and over, adult: Secondary | ICD-10-CM | POA: Diagnosis not present

## 2018-08-31 DIAGNOSIS — Z79899 Other long term (current) drug therapy: Secondary | ICD-10-CM

## 2018-08-31 DIAGNOSIS — Z87891 Personal history of nicotine dependence: Secondary | ICD-10-CM

## 2018-08-31 DIAGNOSIS — I4891 Unspecified atrial fibrillation: Secondary | ICD-10-CM

## 2018-08-31 DIAGNOSIS — Z91041 Radiographic dye allergy status: Secondary | ICD-10-CM

## 2018-08-31 DIAGNOSIS — Z833 Family history of diabetes mellitus: Secondary | ICD-10-CM

## 2018-08-31 DIAGNOSIS — F419 Anxiety disorder, unspecified: Secondary | ICD-10-CM | POA: Diagnosis not present

## 2018-08-31 DIAGNOSIS — I5032 Chronic diastolic (congestive) heart failure: Secondary | ICD-10-CM | POA: Diagnosis not present

## 2018-08-31 DIAGNOSIS — N390 Urinary tract infection, site not specified: Secondary | ICD-10-CM

## 2018-08-31 DIAGNOSIS — A419 Sepsis, unspecified organism: Secondary | ICD-10-CM | POA: Diagnosis not present

## 2018-08-31 DIAGNOSIS — F32A Depression, unspecified: Secondary | ICD-10-CM

## 2018-08-31 DIAGNOSIS — Z8249 Family history of ischemic heart disease and other diseases of the circulatory system: Secondary | ICD-10-CM

## 2018-08-31 DIAGNOSIS — G4733 Obstructive sleep apnea (adult) (pediatric): Secondary | ICD-10-CM | POA: Diagnosis present

## 2018-08-31 LAB — CBC WITH DIFFERENTIAL/PLATELET
ABS IMMATURE GRANULOCYTES: 0.29 10*3/uL — AB (ref 0.00–0.07)
Basophils Absolute: 0.1 10*3/uL (ref 0.0–0.1)
Basophils Relative: 0 %
Eosinophils Absolute: 0.1 10*3/uL (ref 0.0–0.5)
Eosinophils Relative: 1 %
HCT: 38.9 % (ref 36.0–46.0)
Hemoglobin: 12 g/dL (ref 12.0–15.0)
Immature Granulocytes: 2 %
Lymphocytes Relative: 7 %
Lymphs Abs: 1.1 10*3/uL (ref 0.7–4.0)
MCH: 30.3 pg (ref 26.0–34.0)
MCHC: 30.8 g/dL (ref 30.0–36.0)
MCV: 98.2 fL (ref 80.0–100.0)
MONO ABS: 0.4 10*3/uL (ref 0.1–1.0)
Monocytes Relative: 2 %
NEUTROS ABS: 14.2 10*3/uL — AB (ref 1.7–7.7)
Neutrophils Relative %: 88 %
PLATELETS: 215 10*3/uL (ref 150–400)
RBC: 3.96 MIL/uL (ref 3.87–5.11)
RDW: 14.7 % (ref 11.5–15.5)
WBC: 16.1 10*3/uL — AB (ref 4.0–10.5)
nRBC: 0 % (ref 0.0–0.2)

## 2018-08-31 LAB — PROTIME-INR
INR: 1.19
PROTHROMBIN TIME: 15 s (ref 11.4–15.2)

## 2018-08-31 LAB — I-STAT CG4 LACTIC ACID, ED
LACTIC ACID, VENOUS: 1.07 mmol/L (ref 0.5–1.9)
Lactic Acid, Venous: 2.02 mmol/L (ref 0.5–1.9)

## 2018-08-31 LAB — URINALYSIS, ROUTINE W REFLEX MICROSCOPIC
BILIRUBIN URINE: NEGATIVE
Glucose, UA: NEGATIVE mg/dL
KETONES UR: NEGATIVE mg/dL
Nitrite: NEGATIVE
PH: 5 (ref 5.0–8.0)
Protein, ur: 30 mg/dL — AB
SPECIFIC GRAVITY, URINE: 1.013 (ref 1.005–1.030)
WBC, UA: >50 WBC/hpf — ABNORMAL HIGH (ref 0–5)

## 2018-08-31 LAB — COMPREHENSIVE METABOLIC PANEL
ALT: 19 U/L (ref 0–44)
ANION GAP: 13 (ref 5–15)
AST: 17 U/L (ref 15–41)
Albumin: 3.8 g/dL (ref 3.5–5.0)
Alkaline Phosphatase: 77 U/L (ref 38–126)
BUN: 30 mg/dL — ABNORMAL HIGH (ref 8–23)
CALCIUM: 9.1 mg/dL (ref 8.9–10.3)
CO2: 27 mmol/L (ref 22–32)
Chloride: 101 mmol/L (ref 98–111)
Creatinine, Ser: 1.94 mg/dL — ABNORMAL HIGH (ref 0.44–1.00)
GFR calc Af Amer: 31 mL/min — ABNORMAL LOW (ref >60)
GFR, EST NON AFRICAN AMERICAN: 27 mL/min — AB (ref >60)
GLUCOSE: 183 mg/dL — AB (ref 70–99)
POTASSIUM: 3.7 mmol/L (ref 3.5–5.1)
Sodium: 141 mmol/L (ref 135–145)
Total Bilirubin: 0.7 mg/dL (ref 0.3–1.2)
Total Protein: 7.3 g/dL (ref 6.5–8.1)

## 2018-08-31 LAB — GLUCOSE, CAPILLARY: GLUCOSE-CAPILLARY: 167 mg/dL — AB (ref 70–99)

## 2018-08-31 LAB — PROCALCITONIN: PROCALCITONIN: 9.35 ng/mL

## 2018-08-31 MED ORDER — LEVOTHYROXINE SODIUM 50 MCG PO TABS
50.0000 ug | ORAL_TABLET | Freq: Every day | ORAL | Status: DC
  Administered 2018-09-01 – 2018-09-03 (×3): 50 ug via ORAL
  Filled 2018-08-31 (×3): qty 1

## 2018-08-31 MED ORDER — PANTOPRAZOLE SODIUM 40 MG PO TBEC
40.0000 mg | DELAYED_RELEASE_TABLET | Freq: Every day | ORAL | Status: DC
  Administered 2018-08-31 – 2018-09-03 (×4): 40 mg via ORAL
  Filled 2018-08-31 (×4): qty 1

## 2018-08-31 MED ORDER — SODIUM CHLORIDE 0.9 % IV SOLN: 1.0000 g | INTRAVENOUS | Status: DC

## 2018-08-31 MED ORDER — SERTRALINE HCL 50 MG PO TABS: 50.0000 mg | ORAL_TABLET | Freq: Every day | ORAL | Status: DC

## 2018-08-31 MED ORDER — RIVAROXABAN 20 MG PO TABS
20.0000 mg | ORAL_TABLET | Freq: Every day | ORAL | Status: DC
  Administered 2018-08-31 – 2018-09-02 (×3): 20 mg via ORAL
  Filled 2018-08-31 (×3): qty 1

## 2018-08-31 MED ORDER — INSULIN ASPART 100 UNIT/ML ~~LOC~~ SOLN
0.0000 [IU] | Freq: Three times a day (TID) | SUBCUTANEOUS | Status: DC
  Administered 2018-08-31 – 2018-09-01 (×2): 8 [IU] via SUBCUTANEOUS
  Administered 2018-09-01: 2 [IU] via SUBCUTANEOUS
  Administered 2018-09-01: 8 [IU] via SUBCUTANEOUS
  Administered 2018-09-02: 5 [IU] via SUBCUTANEOUS
  Administered 2018-09-02: 3 [IU] via SUBCUTANEOUS
  Administered 2018-09-02: 8 [IU] via SUBCUTANEOUS

## 2018-08-31 MED ORDER — BUSPIRONE HCL 10 MG PO TABS
10.0000 mg | ORAL_TABLET | Freq: Two times a day (BID) | ORAL | Status: DC
  Administered 2018-08-31 – 2018-09-03 (×7): 10 mg via ORAL
  Filled 2018-08-31 (×7): qty 1

## 2018-08-31 MED ORDER — ATROPINE SULFATE 1 % OP SOLN
1.0000 [drp] | Freq: Every day | OPHTHALMIC | Status: DC
  Administered 2018-08-31 – 2018-09-03 (×4): 1 [drp] via OPHTHALMIC
  Filled 2018-08-31: qty 2

## 2018-08-31 MED ORDER — PIPERACILLIN-TAZOBACTAM 3.375 G IVPB
3.3750 g | Freq: Once | INTRAVENOUS | Status: AC
  Administered 2018-08-31: 3.375 g via INTRAVENOUS
  Filled 2018-08-31: qty 50

## 2018-08-31 MED ORDER — ONDANSETRON HCL 4 MG/2ML IJ SOLN
4.0000 mg | Freq: Four times a day (QID) | INTRAMUSCULAR | Status: DC | PRN
  Filled 2018-08-31: qty 2

## 2018-08-31 MED ORDER — VANCOMYCIN HCL IN DEXTROSE 1-5 GM/200ML-% IV SOLN
1000.0000 mg | Freq: Once | INTRAVENOUS | Status: AC
  Administered 2018-08-31: 1000 mg via INTRAVENOUS
  Filled 2018-08-31: qty 200

## 2018-08-31 MED ORDER — ACETAMINOPHEN 500 MG PO TABS
1000.0000 mg | ORAL_TABLET | Freq: Once | ORAL | Status: AC
  Administered 2018-08-31: 1000 mg via ORAL
  Filled 2018-08-31: qty 2

## 2018-08-31 MED ORDER — BUPROPION HCL ER (XL) 150 MG PO TB24
150.0000 mg | ORAL_TABLET | Freq: Every day | ORAL | Status: DC
  Administered 2018-08-31 – 2018-09-03 (×4): 150 mg via ORAL
  Filled 2018-08-31 (×4): qty 1

## 2018-08-31 MED ORDER — ONDANSETRON HCL 4 MG/2ML IJ SOLN
4.0000 mg | Freq: Once | INTRAMUSCULAR | Status: AC
  Administered 2018-08-31: 4 mg via INTRAVENOUS
  Filled 2018-08-31: qty 2

## 2018-08-31 MED ORDER — KETOROLAC TROMETHAMINE 30 MG/ML IJ SOLN
30.0000 mg | Freq: Once | INTRAMUSCULAR | Status: AC
  Administered 2018-08-31: 30 mg via INTRAVENOUS
  Filled 2018-08-31: qty 1

## 2018-08-31 MED ORDER — PRAVASTATIN SODIUM 20 MG PO TABS
40.0000 mg | ORAL_TABLET | Freq: Every day | ORAL | Status: DC
  Administered 2018-08-31 – 2018-09-03 (×4): 40 mg via ORAL
  Filled 2018-08-31 (×4): qty 2

## 2018-08-31 MED ORDER — INSULIN ASPART 100 UNIT/ML ~~LOC~~ SOLN
0.0000 [IU] | Freq: Every day | SUBCUTANEOUS | Status: DC
  Administered 2018-09-02: 2 [IU] via SUBCUTANEOUS

## 2018-08-31 MED ORDER — HYDROMORPHONE HCL 1 MG/ML IJ SOLN
1.0000 mg | INTRAMUSCULAR | Status: DC | PRN
  Administered 2018-08-31 – 2018-09-02 (×4): 1 mg via INTRAVENOUS
  Filled 2018-08-31 (×4): qty 1

## 2018-08-31 MED ORDER — ACETAMINOPHEN 325 MG PO TABS
650.0000 mg | ORAL_TABLET | Freq: Four times a day (QID) | ORAL | Status: DC | PRN
  Administered 2018-09-01: 650 mg via ORAL
  Filled 2018-08-31: qty 2

## 2018-08-31 MED ORDER — MORPHINE SULFATE (PF) 4 MG/ML IV SOLN
4.0000 mg | Freq: Once | INTRAVENOUS | Status: AC
  Administered 2018-08-31: 4 mg via INTRAVENOUS
  Filled 2018-08-31: qty 1

## 2018-08-31 MED ORDER — SODIUM CHLORIDE 0.9 % IV SOLN
2.0000 g | INTRAVENOUS | Status: DC
  Administered 2018-08-31 – 2018-09-02 (×3): 2 g via INTRAVENOUS
  Filled 2018-08-31 (×2): qty 2
  Filled 2018-08-31: qty 20
  Filled 2018-08-31: qty 2

## 2018-08-31 MED ORDER — SODIUM CHLORIDE 0.9 % IV SOLN
INTRAVENOUS | Status: AC
  Administered 2018-08-31 – 2018-09-01 (×3): via INTRAVENOUS

## 2018-08-31 MED ORDER — HEPARIN SODIUM (PORCINE) 5000 UNIT/ML IJ SOLN: 5000.0000 [IU] | Freq: Three times a day (TID) | INTRAMUSCULAR | Status: DC

## 2018-08-31 MED ORDER — SODIUM CHLORIDE 0.9 % IV SOLN
Freq: Once | INTRAVENOUS | Status: AC
  Administered 2018-08-31: 10:00:00 via INTRAVENOUS

## 2018-08-31 NOTE — H&P (Addendum)
History and Physical    Jennifer Foley VOZ:[id number] DOB: September 08, 1956 DOA: 08/31/2018  PCP: Bartholome Bill, MD   Patient coming from: Home  Chief Complaint: back pain  HPI: Jennifer Foley is a 62 y.o. female with medical history significant of uveitis, A. fib, hypertension, hypothyroidism, CHF, depression/anxiety, morbid obesity who presents from home with complaints of severe back pain.  She was apparently all right till yesterday.  This night she woke up from her sleep with severe back pain.  She was also complaining of nausea and vomiting.  EMS reported that she had fever at home. When patient presented to the emergency department, she was found to be febrile, tachycardic and mildly hypotensive.  She was complaining of severe back pain.  Left-sided costovertebral tenderness was noted on presentation but she complains of pain on both lower back and mid spine. Patient has several other comorbidities and follows with other subspecialties as an outpatient. Patient seen and examined the bedside in the emergency department.  She was in excruciating pain and was crying.  Blood pressure noted to be on the lower side.  She was in sinus tachycardia. Patient denies any chest pain, shortness of breath, palpitations, abdominal pain, dysuria, diarrhea, hematochezia or melena.  ED Course: Started on broad-spectrum antibiotics.  IV fluids given.  CT stone study did not show any calculus or acute infectious process.  Review of Systems: As per HPI otherwise 10 point review of systems negative.    Past Medical History:  Diagnosis Date  . Anemia   . Atrial fibrillation ([place])   . Bilateral breast cysts   . CHF (congestive heart failure) ([place])   . Chronic kidney disease    stage 3  . Depression with anxiety   . DM (diabetes mellitus) ([place])   . Esophageal reflux   . HTN (hypertension)   . Hyperlipemia   . Hyperthyroidism   . INR (international normal ratio) abnormal   . MRSA (methicillin  resistant staph aureus) culture positive   . Pneumonia   . PTSD (post-traumatic stress disorder)     Past Surgical History:  Procedure Laterality Date  . ABDOMINAL HYSTERECTOMY    . BREAST BIOPSY    . CATARACT EXTRACTION  2018  . CESAREAN SECTION    . DENTAL SURGERY    . EYE SURGERY    . FOOT SURGERY    . HERNIA REPAIR    . KNEE SURGERY    . TONSILLECTOMY AND ADENOIDECTOMY       reports that she quit smoking about 14 years ago. Her smokeless tobacco use includes snuff. Her alcohol and drug histories are not on file.  Allergies  Allergen Reactions  . Contrast Media [Iodinated Diagnostic Agents]   . Doxycycline   . Albuterol Sulfate Rash  . Codeine Other (See Comments)    Breaks out in a rash  . Erythromycin Base Diarrhea and Rash    Family History  Problem Relation Age of Onset  . Alcoholism Father   . Arthritis Father   . Hypertension Father        sister, mother  . Diabetes Mellitus I Mother        sister  . Renal Disease Mother   . Stroke Mother   . Epilepsy Brother      Prior to Admission medications   Medication Sig Start Date End Date Taking? Authorizing Provider  acetaminophen (TYLENOL) 500 MG tablet Take 1,000 mg by mouth daily as needed for mild pain.    [provider]  Adalimumab 40 MG/0.8ML PNKT Inject into the skin. 01/31/18   [provider]  albuterol (VENTOLIN HFA) 108 (90 Base) MCG/ACT inhaler Inhale 2 puffs into the lungs every 6 (six) hours as needed. 09/20/17   [provider]  benzonatate (TESSALON) 100 MG capsule Take 1 capsule (100 mg total) by mouth every 8 (eight) hours. 11/15/16   Domenic Moras, PA-C  busPIRone (BUSPAR) 10 MG tablet Take 10 mg by mouth 2 (two) times daily.    [provider]  DUREZOL 0.05 % EMUL Place 1 drop into both eyes 4 (four) times daily. 11/04/17   [provider]  ferrous gluconate (FERGON) 324 MG tablet Take 324 mg by mouth 2 (two) times daily.     [provider]    folic acid (FOLVITE) 1 MG tablet Take 1 mg by mouth daily. 09/18/17   [provider]  furosemide (LASIX) 40 MG tablet Take 40 mg by mouth daily.    [provider]  levothyroxine (SYNTHROID, LEVOTHROID) 50 MCG tablet Take 50 mcg by mouth daily before breakfast.    [provider]  lisinopril-hydrochlorothiazide (PRINZIDE,ZESTORETIC) 20-12.5 MG per tablet Take 1 tablet by mouth daily.    [provider]  methotrexate 2.5 MG tablet Take 15 mg by mouth once a week. 11/01/17   [provider]  omeprazole (PRILOSEC) 20 MG capsule Take 20 mg by mouth daily.    [provider]  potassium chloride (K-DUR,KLOR-CON) 10 MEQ tablet Take 10 mEq by mouth daily. 01/02/18   [provider]  pravastatin (PRAVACHOL) 40 MG tablet Take 40 mg by mouth daily.    [provider]  rivaroxaban (XARELTO) 20 MG TABS tablet Take by mouth every evening.     [provider]  sertraline (ZOLOFT) 100 MG tablet Take 50 mg by mouth daily.     [provider]  tiZANidine (ZANAFLEX) 4 MG tablet Take 4 mg by mouth every 6 (six) hours as needed for muscle spasms.    [provider]    Physical Exam: Vitals:   08/31/18 1008 08/31/18 1014 08/31/18 1042 08/31/18 1144  BP: 107/75 107/75 (!) 131/118 (!) 114/58  Pulse: (!) 130 (!) 126 (!) 125 (!) 115  Resp: 20 12 (!) 28 (!) 38  Temp:    (!) 101.4 F (38.6 C)  TempSrc:      SpO2: 96% 94% 98% 94%  Weight:      Height:        Constitutional: In moderate distress due to pain, morbidly obese Vitals:   08/31/18 1008 08/31/18 1014 08/31/18 1042 08/31/18 1144  BP: 107/75 107/75 (!) 131/118 (!) 114/58  Pulse: (!) 130 (!) 126 (!) 125 (!) 115  Resp: 20 12 (!) 28 (!) 38  Temp:    (!) 101.4 F (38.6 C)  TempSrc:      SpO2: 96% 94% 98% 94%  Weight:      Height:       Eyes: PERRL, lids and conjunctivae normal ENMT: Mucous membranes are dry. Posterior pharynx clear of any exudate or  lesions.Normal dentition.  Neck: normal, supple, no masses, no thyromegaly Respiratory: clear to auscultation bilaterally, no wheezing, no crackles. Normal respiratory effort. No accessory muscle use.  Cardiovascular: Regular rate and rhythm, no murmurs / rubs / gallops. No extremity edema. 2+ pedal pulses. No carotid bruits.  Abdomen: no tenderness, no masses palpated. No hepatosplenomegaly. Bowel sounds positive.  Musculoskeletal: no clubbing / cyanosis. No joint deformity upper and lower  extremities. Good ROM, no contractures. Normal muscle tone.  Severe left costovertebral angle tenderness.  Point tenderness on lumbar spine Skin: no rashes, lesions, ulcers. No induration Neurologic: CN 2-12 grossly intact. Sensation intact, DTR normal. Strength 5/5 in all 4.  Psychiatric: Normal judgment and insight. Alert and oriented x 3. Normal mood.   Foley Catheter:None  Labs on Admission: I have personally reviewed following labs and imaging studies  CBC: Recent Labs  Lab 08/31/18 0955  WBC 16.1*  NEUTROABS 14.2*  HGB 12.0  HCT 38.9  MCV 98.2  PLT 191   Basic Metabolic Panel: Recent Labs  Lab 08/31/18 0955  NA 141  K 3.7  CL 101  CO2 27  GLUCOSE 183*  BUN 30*  CREATININE 1.94*  CALCIUM 9.1   GFR: Estimated Creatinine Clearance: 32.9 mL/min (A) (by C-G formula based on SCr of 1.94 mg/dL (H)). Liver Function Tests: Recent Labs  Lab 08/31/18 0955  AST 17  ALT 19  ALKPHOS 77  BILITOT 0.7  PROT 7.3  ALBUMIN 3.8   No results for input(s): LIPASE, AMYLASE in the last 168 hours. No results for input(s): AMMONIA in the last 168 hours. Coagulation Profile: Recent Labs  Lab 08/31/18 0955  INR 1.19   Cardiac Enzymes: No results for input(s): CKTOTAL, CKMB, CKMBINDEX, TROPONINI in the last 168 hours. BNP (last 3 results) No results for input(s): PROBNP in the last 8760 hours. HbA1C: No results for input(s): HGBA1C in the last 72 hours. CBG: No results for input(s):  GLUCAP in the last 168 hours. Lipid Profile: No results for input(s): CHOL, HDL, LDLCALC, TRIG, CHOLHDL, LDLDIRECT in the last 72 hours. Thyroid Function Tests: No results for input(s): TSH, T4TOTAL, FREET4, T3FREE, THYROIDAB in the last 72 hours. Anemia Panel: No results for input(s): VITAMINB12, FOLATE, FERRITIN, TIBC, IRON, RETICCTPCT in the last 72 hours. Urine analysis:    Component Value Date/Time   COLORURINE YELLOW 08/31/2018 0932   APPEARANCEUR CLEAR 08/31/2018 0932   LABSPEC 1.013 08/31/2018 0932   PHURINE 5.0 08/31/2018 0932   GLUCOSEU NEGATIVE 08/31/2018 0932   HGBUR MODERATE (A) 08/31/2018 0932   BILIRUBINUR NEGATIVE 08/31/2018 0932   KETONESUR NEGATIVE 08/31/2018 0932   PROTEINUR 30 (A) 08/31/2018 0932   NITRITE NEGATIVE 08/31/2018 0932   LEUKOCYTESUR LARGE (A) 08/31/2018 0932    Radiological Exams on Admission: Dg Chest 2 View  Result Date: 08/31/2018 CLINICAL DATA:  Fever, nausea and vomiting. EXAM: CHEST - 2 VIEW COMPARISON:  07/11/2018 FINDINGS: Lordotic technique demonstrated. Lungs are adequately inflated without focal airspace consolidation or effusion. Cardiomediastinal silhouette, bones and soft tissues are within normal. IMPRESSION: No active cardiopulmonary disease. Electronically Signed   By: Marin Olp M.D.   On: 08/31/2018 10:50   Ct Renal Stone Study  Result Date: 08/31/2018 CLINICAL DATA:  Pt presents from her home via EMS with c/o back pain on the lower left side. Upon EMS arrival, pt was noted to have a fever of 102.2. Pt is moaning and groaning in the bed, normal at baseline per EMS. Pt also reports a hx of tremors at baseline and urinary incontinence. Pt did report 3 vomiting episodes at her house prior to EMS arrival. EXAM: CT ABDOMEN AND PELVIS WITHOUT CONTRAST TECHNIQUE: Multidetector CT imaging of the abdomen and pelvis was performed following the standard protocol without IV contrast. COMPARISON:  11/16/2017 FINDINGS: Lower chest: Minor basilar  atelectasis. Heart normal in size. No acute findings Hepatobiliary: No focal liver abnormality is seen. No gallstones, gallbladder wall thickening, or  biliary dilatation. Pancreas: Unremarkable. No pancreatic ductal dilatation or surrounding inflammatory changes. Spleen: Normal in size without focal abnormality. Adrenals/Urinary Tract: Adrenal glands are unremarkable. Kidneys are normal, without renal calculi, focal lesion, or hydronephrosis. Bladder is unremarkable. Stomach/Bowel: Stomach is within normal limits. Appendix appears normal. No evidence of bowel wall thickening, distention, or inflammatory changes. Vascular/Lymphatic: Aortic atherosclerosis. No enlarged abdominal or pelvic lymph nodes. Reproductive: Status post hysterectomy. No adnexal masses. Other: No abdominal wall hernia or abnormality. No abdominopelvic ascites. Musculoskeletal: No acute or significant osseous findings. IMPRESSION: 1. No acute findings within the abdomen or pelvis. 2. No renal or ureteral stones or obstructive uropathy. No findings to account for the patient's back or left-sided pain. 3. Aortic atherosclerosis. Electronically Signed   By: Lajean Manes M.D.   On: 08/31/2018 11:23     Assessment/Plan Principal Problem:   Sepsis secondary to UTI [facility]) Active Problems:   A-fib (HCC)   CHF (congestive heart failure) (H[facility])   Acute pyelonephritis   Leucocytosis   Hypothyroidism   CKD (chronic kidney disease) stage 3, GFR 30-59 ml/min (HCC)   HTN (hypertension)   HLD (hyperlipidemia)   Anxiety and depression   Obesity   Sepsis ([place])  Sepsis secondary to UTI/pyelonephritis: Imagings are negative for pyelonephritis.  UA suggestive of urinary tract infection.  She presented with fever.  We will continue with ceftriaxone for now.  Urine cultures and blood cultures have been sent.  Mild elevated lactic acid level.  We will follow-up procalcitonin.  Back pain/left CVA tenderness: Patient complaining of severe back pain.   She has severe tenderness more on the left but also on mid spine.  We will follow-up with MRI of the thoracic and lumbar spine to rule out any discitis or epidural abscess or Disc prolapse.  Continue pain management.  Leukocytosis: We will continue to monitor the trend.  Blood cultures have been sent.  Continue antibiotics  A. fib: Currently in sinus tachycardia.  Continue Xarelto.  Follows with cardiology  CHF: Recent echocardiogram not on file.  Currently she is not in  heart failure.  He is on Lasix at home which we will hold for now.  Hypothyroidism: Continue Synthyroid  AKI on CKD stage III: Current creatinine slightly elevated from baseline.  Her baseline creatinine is around 1.6.  We will continue IV fluids  Hypertension: Currently blood pressure is soft.  Will hold her home medications.  Hyperlipidemia: Continue statin  Anxiety/depression/PTSD: On buspirone, sertraline at home which we will continue.  OSA: Continue CPAP  GERD: Continue PPI  History of uveitis: On adalimumab and methotrexate.  Follows with ophthalmologist   Severity of Illness: The appropriate patient status for this patient is INPATIENT.  DVT prophylaxis: Xarelto Code Status: Full Family Communication: Family friend present at the bedside Consults called: None     Shelly Coss MD [facility] Pager [phone]  If 7PM-7AM, please contact night-coverage [email or web address] Password [email or web address]  08/31/2018, 11:56 AM

## 2018-08-31 NOTE — ED Triage Notes (Signed)
Pt presents from her home via EMS with c/o back pain on the lower left side. Upon EMS arrival, pt was noted to have a fever of 102.2. Pt is moaning and groaning in the bed, normal at baseline per EMS. Pt also reports a hx of tremors at baseline and urinary incontinence. Pt did report 3 vomiting episodes at her house prior to EMS arrival.

## 2018-08-31 NOTE — ED Notes (Signed)
Patient transported to X-ray 

## 2018-08-31 NOTE — ED Provider Notes (Signed)
[facility] DEPT Provider Note   CSN: [id number] Arrival date & time: 08/31/18  4332     History   Chief Complaint Chief Complaint  Patient presents with  . Back Pain  . Fever    HPI Jennifer Foley is a 62 y.o. female.  Patient is a 62 year old female with past medical history of diabetes, hypertension, CHF, atrial fibrillation, and PTSD.  She presents today for evaluation of back pain.  This woke her from sleep this morning and is severe.  She has had nausea and vomiting along with the discomfort.  EMS reports fever at home.  She denies any diarrhea or constipation.  She denies any difficulty breathing.  She denies any weakness or numbness of her legs.  The history is provided by the patient.  Back Pain   This is a new problem. The current episode started 1 to 2 hours ago. The problem occurs constantly. The problem has not changed since onset.The pain is associated with no known injury. The pain is present in the lumbar spine. The quality of the pain is described as stabbing. The pain does not radiate. The pain is severe. Associated symptoms include a fever. She has tried nothing for the symptoms.  Fever      Past Medical History:  Diagnosis Date  . Anemia   . Atrial fibrillation ([place])   . Bilateral breast cysts   . CHF (congestive heart failure) ([place])   . Chronic kidney disease    stage 3  . Depression with anxiety   . DM (diabetes mellitus) ([place])   . Esophageal reflux   . HTN (hypertension)   . Hyperlipemia   . Hyperthyroidism   . INR (international normal ratio) abnormal   . MRSA (methicillin resistant staph aureus) culture positive   . Pneumonia   . PTSD (post-traumatic stress disorder)     Patient Active Problem List   Diagnosis Date Noted  . Obstructive sleep apnea treated with continuous positive airway pressure (CPAP) 08/13/2018    Past Surgical History:  Procedure Laterality Date  . ABDOMINAL HYSTERECTOMY    . BREAST  BIOPSY    . CATARACT EXTRACTION  2018  . CESAREAN SECTION    . DENTAL SURGERY    . EYE SURGERY    . FOOT SURGERY    . HERNIA REPAIR    . KNEE SURGERY    . TONSILLECTOMY AND ADENOIDECTOMY       OB History   None      Home Medications    Prior to Admission medications   Medication Sig Start Date End Date Taking? Authorizing Provider  acetaminophen (TYLENOL) 500 MG tablet Take 1,000 mg by mouth daily as needed for mild pain.    [provider]  Adalimumab 40 MG/0.8ML PNKT Inject into the skin. 01/31/18   [provider]  albuterol (VENTOLIN HFA) 108 (90 Base) MCG/ACT inhaler Inhale 2 puffs into the lungs every 6 (six) hours as needed. 09/20/17   [provider]  benzonatate (TESSALON) 100 MG capsule Take 1 capsule (100 mg total) by mouth every 8 (eight) hours. 11/15/16   Domenic Moras, PA-C  busPIRone (BUSPAR) 10 MG tablet Take 10 mg by mouth 2 (two) times daily.    [provider]  DUREZOL 0.05 % EMUL Place 1 drop into both eyes 4 (four) times daily. 11/04/17   [provider]  ferrous gluconate (FERGON) 324 MG tablet Take 324 mg by mouth 2 (two) times daily.  [provider]  folic acid (FOLVITE) 1 MG tablet Take 1 mg by mouth daily. 09/18/17   [provider]  furosemide (LASIX) 40 MG tablet Take 40 mg by mouth daily.    [provider]  levothyroxine (SYNTHROID, LEVOTHROID) 50 MCG tablet Take 50 mcg by mouth daily before breakfast.    [provider]  lisinopril-hydrochlorothiazide (PRINZIDE,ZESTORETIC) 20-12.5 MG per tablet Take 1 tablet by mouth daily.    [provider]  methotrexate 2.5 MG tablet Take 15 mg by mouth once a week. 11/01/17   [provider]  omeprazole (PRILOSEC) 20 MG capsule Take 20 mg by mouth daily.    [provider]  potassium chloride (K-DUR,KLOR-CON) 10 MEQ tablet Take 10 mEq by mouth daily. 01/02/18   [provider]  pravastatin (PRAVACHOL)  40 MG tablet Take 40 mg by mouth daily.    [provider]  rivaroxaban (XARELTO) 20 MG TABS tablet Take by mouth every evening.     [provider]  sertraline (ZOLOFT) 100 MG tablet Take 50 mg by mouth daily.     [provider]  tiZANidine (ZANAFLEX) 4 MG tablet Take 4 mg by mouth every 6 (six) hours as needed for muscle spasms.    [provider]    Family History Family History  Problem Relation Age of Onset  . Alcoholism Father   . Arthritis Father   . Hypertension Father        sister, mother  . Diabetes Mellitus I Mother        sister  . Renal Disease Mother   . Stroke Mother   . Epilepsy Brother     Social History Social History   Tobacco Use  . Smoking status: Former Smoker    Last attempt to quit: 12/15/2003    Years since quitting: 14.7  . Smokeless tobacco: Current User    Types: Snuff  . Tobacco comment: 3 times per day  Substance Use Topics  . Alcohol use: Not on file  . Drug use: Not on file     Allergies   Contrast media [iodinated diagnostic agents]; Doxycycline; Albuterol sulfate; Codeine; and Erythromycin base   Review of Systems Review of Systems  Constitutional: Positive for fever.  Musculoskeletal: Positive for back pain.  All other systems reviewed and are negative.    Physical Exam Updated Vital Signs Ht 5\' 1"  (1.549 m)   Wt 101.6 kg   SpO2 97% Comment: RA  BMI 42.32 kg/m   Physical Exam  Constitutional: She is oriented to person, place, and time. She appears well-developed and well-nourished. No distress.  HENT:  Head: Normocephalic and atraumatic.  Neck: Normal range of motion. Neck supple.  Cardiovascular: Normal rate and regular rhythm. Exam reveals no gallop and no friction rub.  No murmur heard. Pulmonary/Chest: Effort normal and breath sounds normal. No respiratory distress. She has no wheezes.  Abdominal: Soft. Bowel sounds are normal. She exhibits no distension. There is tenderness.    There is left-sided CVA tenderness noted.  Musculoskeletal: Normal range of motion.  Neurological: She is alert and oriented to person, place, and time.  Skin: Skin is warm and dry. She is not diaphoretic.  Nursing note and vitals reviewed.    ED Treatments / Results  Labs (all labs ordered are listed, but only abnormal results are displayed) Labs Reviewed - No data to display  EKG None  Radiology No results found.  Procedures Procedures (including critical care time)  Medications Ordered  in ED Medications  ondansetron (ZOFRAN) injection 4 mg (has no administration in time range)  morphine 4 MG/ML injection 4 mg (has no administration in time range)  ketorolac (TORADOL) 30 MG/ML injection 30 mg (has no administration in time range)     Initial Impression / Assessment and Plan / ED Course  I have reviewed the triage vital signs and the nursing notes.  Pertinent labs & imaging results that were available during my care of the patient were reviewed by me and considered in my medical decision making (see chart for details).  Patient presents here with complaints of severe back pain that started abruptly this morning.  She arrived here febrile with temperature of 103.7.  My concern initially was for a kidney stone given the abrupt onset of symptoms.  A CT scan was performed and was unremarkable.  Urinalysis reveals findings suggestive of a UTI with white count of 16,000, but remainder of work-up is otherwise unremarkable.  She is tachycardic but normotensive upon presentation with mildly elevated lactate of 2.02.  She was given IV fluids and Tylenol.  Patient given broad-spectrum antibiotics pending results of blood cultures and urine culture.  Patient will be admitted to the hospitalist service.  Dr. Tawanna Solo agrees to admit.  CRITICAL CARE Performed by: Veryl Speak Total critical care time: 35 minutes Critical care time was exclusive of separately billable procedures and  treating other patients. Critical care was necessary to treat or prevent imminent or life-threatening deterioration. Critical care was time spent personally by me on the following activities: development of treatment plan with patient and/or surrogate as well as nursing, discussions with consultants, evaluation of patient's response to treatment, examination of patient, obtaining history from patient or surrogate, ordering and performing treatments and interventions, ordering and review of laboratory studies, ordering and review of radiographic studies, pulse oximetry and re-evaluation of patient's condition.   Final Clinical Impressions(s) / ED Diagnoses   Final diagnoses:  None    ED Discharge Orders    None       Veryl Speak, MD 08/31/18 1207

## 2018-08-31 NOTE — ED Notes (Signed)
Pt placed on 2L of O2 as she was asleep when this RN entered the room after receiving morphine and her O2 was between 89-90%.

## 2018-08-31 NOTE — ED Notes (Signed)
Bed: WA21 Expected date: 08/31/18 Expected time: 9:08 AM Means of arrival: Ambulance Comments: ? Sepsis, fever

## 2018-08-31 NOTE — ED Notes (Signed)
Dr. Stark Jock made aware of pt's rectal temperature.

## 2018-08-31 NOTE — ED Notes (Signed)
Patient transported to CT 

## 2018-08-31 NOTE — Plan of Care (Signed)

## 2018-09-01 DIAGNOSIS — N179 Acute kidney failure, unspecified: Secondary | ICD-10-CM

## 2018-09-01 DIAGNOSIS — N10 Acute tubulo-interstitial nephritis: Secondary | ICD-10-CM

## 2018-09-01 DIAGNOSIS — R6520 Severe sepsis without septic shock: Secondary | ICD-10-CM

## 2018-09-01 DIAGNOSIS — F329 Major depressive disorder, single episode, unspecified: Secondary | ICD-10-CM

## 2018-09-01 DIAGNOSIS — I480 Paroxysmal atrial fibrillation: Secondary | ICD-10-CM

## 2018-09-01 DIAGNOSIS — F419 Anxiety disorder, unspecified: Secondary | ICD-10-CM

## 2018-09-01 DIAGNOSIS — I509 Heart failure, unspecified: Secondary | ICD-10-CM

## 2018-09-01 DIAGNOSIS — I10 Essential (primary) hypertension: Secondary | ICD-10-CM

## 2018-09-01 DIAGNOSIS — A419 Sepsis, unspecified organism: Secondary | ICD-10-CM

## 2018-09-01 DIAGNOSIS — A4151 Sepsis due to Escherichia coli [E. coli]: Principal | ICD-10-CM

## 2018-09-01 DIAGNOSIS — E039 Hypothyroidism, unspecified: Secondary | ICD-10-CM

## 2018-09-01 DIAGNOSIS — D72829 Elevated white blood cell count, unspecified: Secondary | ICD-10-CM

## 2018-09-01 DIAGNOSIS — N390 Urinary tract infection, site not specified: Secondary | ICD-10-CM

## 2018-09-01 DIAGNOSIS — N183 Chronic kidney disease, stage 3 (moderate): Secondary | ICD-10-CM

## 2018-09-01 LAB — BASIC METABOLIC PANEL
ANION GAP: 8 (ref 5–15)
BUN: 32 mg/dL — ABNORMAL HIGH (ref 8–23)
CALCIUM: 8 mg/dL — AB (ref 8.9–10.3)
CHLORIDE: 103 mmol/L (ref 98–111)
CO2: 25 mmol/L (ref 22–32)
Creatinine, Ser: 2.15 mg/dL — ABNORMAL HIGH (ref 0.44–1.00)
GFR calc Af Amer: 27 mL/min — ABNORMAL LOW (ref >60)
GFR calc non Af Amer: 23 mL/min — ABNORMAL LOW (ref >60)
GLUCOSE: 179 mg/dL — AB (ref 70–99)
Potassium: 4 mmol/L (ref 3.5–5.1)
Sodium: 136 mmol/L (ref 135–145)

## 2018-09-01 LAB — CBC
HEMATOCRIT: 32 % — AB (ref 36.0–46.0)
Hemoglobin: 9.7 g/dL — ABNORMAL LOW (ref 12.0–15.0)
MCH: 30.2 pg (ref 26.0–34.0)
MCHC: 30.3 g/dL (ref 30.0–36.0)
MCV: 99.7 fL (ref 80.0–100.0)
NRBC: 0 % (ref 0.0–0.2)
PLATELETS: 197 10*3/uL (ref 150–400)
RBC: 3.21 MIL/uL — AB (ref 3.87–5.11)
RDW: 14.7 % (ref 11.5–15.5)
WBC: 13.2 10*3/uL — ABNORMAL HIGH (ref 4.0–10.5)

## 2018-09-01 LAB — GLUCOSE, CAPILLARY
GLUCOSE-CAPILLARY: 264 mg/dL — AB (ref 70–99)
Glucose-Capillary: 260 mg/dL — ABNORMAL HIGH (ref 70–99)
Glucose-Capillary: 144 mg/dL — ABNORMAL HIGH (ref 70–99)
Glucose-Capillary: 268 mg/dL — ABNORMAL HIGH (ref 70–99)
Glucose-Capillary: 197 mg/dL — ABNORMAL HIGH (ref 70–99)

## 2018-09-01 LAB — BLOOD CULTURE ID PANEL (REFLEXED)
ACINETOBACTER BAUMANNII: NOT DETECTED
CANDIDA ALBICANS: NOT DETECTED
CANDIDA TROPICALIS: NOT DETECTED
CARBAPENEM RESISTANCE: NOT DETECTED
Candida glabrata: NOT DETECTED
Candida krusei: NOT DETECTED
Candida parapsilosis: NOT DETECTED
Enterobacter cloacae complex: NOT DETECTED
Enterobacteriaceae species: DETECTED — AB
Enterococcus species: NOT DETECTED
Escherichia coli: DETECTED — AB
HAEMOPHILUS INFLUENZAE: NOT DETECTED
KLEBSIELLA OXYTOCA: NOT DETECTED
KLEBSIELLA PNEUMONIAE: NOT DETECTED
Listeria monocytogenes: NOT DETECTED
NEISSERIA MENINGITIDIS: NOT DETECTED
PROTEUS SPECIES: NOT DETECTED
Pseudomonas aeruginosa: NOT DETECTED
STAPHYLOCOCCUS AUREUS BCID: NOT DETECTED
STREPTOCOCCUS SPECIES: NOT DETECTED
Serratia marcescens: NOT DETECTED
Staphylococcus species: NOT DETECTED
Streptococcus agalactiae: NOT DETECTED
Streptococcus pneumoniae: NOT DETECTED
Streptococcus pyogenes: NOT DETECTED

## 2018-09-01 LAB — LACTIC ACID, PLASMA: Lactic Acid, Venous: 0.9 mmol/L (ref 0.5–1.9)

## 2018-09-01 LAB — HIV ANTIBODY (ROUTINE TESTING W REFLEX): HIV Screen 4th Generation wRfx: NONREACTIVE

## 2018-09-01 LAB — PROCALCITONIN: Procalcitonin: 21.76 ng/mL

## 2018-09-01 MED ORDER — PREDNISONE 20 MG PO TABS
20.0000 mg | ORAL_TABLET | Freq: Every day | ORAL | Status: AC
  Administered 2018-09-01 – 2018-09-03 (×3): 20 mg via ORAL
  Filled 2018-09-01 (×3): qty 1

## 2018-09-01 MED ORDER — PREDNISONE 10 MG PO TABS: 10.0000 mg | ORAL_TABLET | Freq: Every day | ORAL | Status: DC

## 2018-09-01 MED ORDER — MOMETASONE FURO-FORMOTEROL FUM 100-5 MCG/ACT IN AERO
2.0000 | INHALATION_SPRAY | Freq: Two times a day (BID) | RESPIRATORY_TRACT | Status: DC
  Administered 2018-09-01 – 2018-09-03 (×4): 2 via RESPIRATORY_TRACT
  Filled 2018-09-01: qty 8.8

## 2018-09-01 NOTE — Progress Notes (Signed)
Pt's temperature 101 this morning. PRN tylenol given and temperature 100.9 on reassessment. MD on call, Hal Hope, notified. No new orders placed at this time. Will continue to monitor.

## 2018-09-01 NOTE — Progress Notes (Signed)
PHARMACY - PHYSICIAN COMMUNICATION CRITICAL VALUE ALERT - BLOOD CULTURE IDENTIFICATION (BCID)  Jennifer Foley is an 62 y.o. female who presented to [facility] on 08/31/2018 with a chief complaint of back pain, N/V.  Assessment:  Possible urinary source (include suspected source if known)  Name of physician (or Provider) Contacted: Dr. Loleta Books  Current antibiotics: Ceftriaxone 2g IV q24h  Changes to prescribed antibiotics recommended:  Patient is on recommended antibiotics - No changes needed  Results for orders placed or performed during the hospital encounter of 08/31/18  Blood Culture ID Panel (Reflexed) (Collected: 08/31/2018  9:44 AM)  Result Value Ref Range   Enterococcus species NOT DETECTED NOT DETECTED   Listeria monocytogenes NOT DETECTED NOT DETECTED   Staphylococcus species NOT DETECTED NOT DETECTED   Staphylococcus aureus (BCID) NOT DETECTED NOT DETECTED   Streptococcus species NOT DETECTED NOT DETECTED   Streptococcus agalactiae NOT DETECTED NOT DETECTED   Streptococcus pneumoniae NOT DETECTED NOT DETECTED   Streptococcus pyogenes NOT DETECTED NOT DETECTED   Acinetobacter baumannii NOT DETECTED NOT DETECTED   Enterobacteriaceae species DETECTED (A) NOT DETECTED   Enterobacter cloacae complex NOT DETECTED NOT DETECTED   Escherichia coli DETECTED (A) NOT DETECTED   Klebsiella oxytoca NOT DETECTED NOT DETECTED   Klebsiella pneumoniae NOT DETECTED NOT DETECTED   Proteus species NOT DETECTED NOT DETECTED   Serratia marcescens NOT DETECTED NOT DETECTED   Carbapenem resistance NOT DETECTED NOT DETECTED   Haemophilus influenzae NOT DETECTED NOT DETECTED   Neisseria meningitidis NOT DETECTED NOT DETECTED   Pseudomonas aeruginosa NOT DETECTED NOT DETECTED   Candida albicans NOT DETECTED NOT DETECTED   Candida glabrata NOT DETECTED NOT DETECTED   Candida krusei NOT DETECTED NOT DETECTED   Candida parapsilosis NOT DETECTED NOT DETECTED   Candida tropicalis NOT DETECTED NOT  DETECTED    Peggyann Juba, PharmD, BCPS Pager: [phone] 09/01/2018  8:12 AM

## 2018-09-01 NOTE — Progress Notes (Signed)
PROGRESS NOTE    Jennifer Foley  PTW:[id number] DOB: 06-03-1956 DOA: 08/31/2018 PCP: Bartholome Bill, MD      Brief Narrative:  Jennifer Foley is a 62 y.o. 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  In ER CT negative for hydro or pyelo, but UA+, started on CTX for UTI.   Assessment & Plan:  Sepsis from UTI E coli bacteremia Lactate greater than 2 admission, heart rate greater than 120, febrile to 101F, blood pressure 92/57, improved with fluids.  Cultures now growing E. coli in 2 of 2.  CT chest without contrast showed no evidence of complications from pyelonephritis. -Continue ceftriaxone   AKI on CKD IV Baseline Cr 1.5-1.8, admitted at 1.9, trended up overnight.  Likely from sepsis. -IV fluids for 12 more hours -Trend BMP -Hold Lasix, lisinopril, HCTZ  COPD without exacerbation -Continue home Symbic as Dulera  OSA on CPAP -Continue CPAP  Hypertension Blood pressure soft overnight. -Hold lisinopril, HCTZ, Lasix until hemodynamics clearer  Diabetes Diet controlled.  Hemoglobin A1c 6.9% in August outside records. - Continue sliding scale insulin as needed -Continue pravastatin  Paroxysmal atrial fibrillation Chads 2 Vascor 3.  On Xarelto.  Currently in sinus. 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  Followed by Dr. Manuella Ghazi, optho. -Continue prednisone, adjust to stress dosing for 3 days -Continue eye drops  Hypothyroidism  -Continue levothyroxine  Anemia of chornic disease Hgb drop from 12 --> 9 g/dL yesterday, near baseline 10 g/dL, likely dilutional.  No clinical bleeding. -Trend Hgb -Restart iron on discharge  Other medications -Continue Wellbutrin, BuSpar -Continue pantoprazole  Morbid obesity BMI >40, with HTN, DM.       MDM and disposition: The below labs and imaging reports were reviewed and summarized  above.  Medication management as above.  The patient was admitted with sepsis, found to have E coli bacteremia and AKI.  This is a severe acute illness with threat to life and bodily function.  She is still fevering overnight, we will continue IV fluids and IV antibiotics, closely monitor renal function.          DVT prophylaxis: Xarelto Code Status: FULL Family Communication: daughter at bedisde    Consultants:   None  Procedures:   None  Antimicrobials:   Ceftriaxone 11/09 >>    Subjective: Feeling tired, malaise.  Fever overnight. No vomiting,no confusion, no syncope.  Back pain not changed.  Objective: Vitals:   08/31/18 1333 08/31/18 2041 09/01/18 0446 09/01/18 0551  BP: 112/61 101/64 (!) 106/56   Pulse: (!) 106 93 (!) 105   Resp:  20 20   Temp: 100 F (37.8 C) 99.3 F (37.4 C) (!) 101 F (38.3 C) (!) 100.9 F (38.3 C)  TempSrc: Oral Oral Oral Oral  SpO2: 91% 98% 95%   Weight:      Height:        Intake/Output Summary (Last 24 hours) at 09/01/2018 0826 Last data filed at 09/01/2018 0600 Gross per 24 hour  Intake 2196.12 ml  Output -  Net 2196.12 ml   Filed Weights   08/31/18 0920  Weight: 101.6 kg    Examination: General appearance: obese adult female, alert and in no acute distress.   HEENT: Anicteric, conjunctiva pink, I do not appreciate any deformity of the iris right now, lids and lashes normal. No nasal deformity, discharge, epistaxis.  Lips moist, edentulous, OP moist,  no oral lesions, hearing normal.   Skin: Warm and dry.  No jaundice.  No suspicious rashes or lesions. Cardiac: Tachycardic, regular, nl S1-S2, no murmurs appreciated.  Capillary refill is brisk.  JVP not visible.  No LE edema.  Radia  pulses 2+ and symmetric. Respiratory: Normal respiratory rate and rhythm.  CTAB without rales or wheezes. Abdomen: Abdomen soft.  Mild nonfocal TTP with voluntary guarding, no rebound or rigidity. No ascites, distension, hepatosplenomegaly.   Mild cva tenderness, miinmal MSK: No deformities or effusions. Neuro: Awake and alert.  EOMI, moves all extremities. Speech fluent.    Psych: Sensorium intact and responding to questions, attention normal. Affect normal.  Judgment and insight appear normal.    Data Reviewed: I have personally reviewed following labs and imaging studies:  CBC: Recent Labs  Lab 08/31/18 0955 09/01/18 0439  WBC 16.1* 13.2*  NEUTROABS 14.2*  --   HGB 12.0 9.7*  HCT 38.9 32.0*  MCV 98.2 99.7  PLT 215 983   Basic Metabolic Panel: Recent Labs  Lab 08/31/18 0955 09/01/18 0439  NA 141 136  K 3.7 4.0  CL 101 103  CO2 27 25  GLUCOSE 183* 179*  BUN 30* 32*  CREATININE 1.94* 2.15*  CALCIUM 9.1 8.0*   GFR: Estimated Creatinine Clearance: 29.7 mL/min (A) (by C-G formula based on SCr of 2.15 mg/dL (H)). Liver Function Tests: Recent Labs  Lab 08/31/18 0955  AST 17  ALT 19  ALKPHOS 77  BILITOT 0.7  PROT 7.3  ALBUMIN 3.8   No results for input(s): LIPASE, AMYLASE in the last 168 hours. No results for input(s): AMMONIA in the last 168 hours. Coagulation Profile: Recent Labs  Lab 08/31/18 0955  INR 1.19   Cardiac Enzymes: No results for input(s): CKTOTAL, CKMB, CKMBINDEX, TROPONINI in the last 168 hours. BNP (last 3 results) No results for input(s): PROBNP in the last 8760 hours. HbA1C: No results for input(s): HGBA1C in the last 72 hours. CBG: Recent Labs  Lab 08/31/18 1834 08/31/18 2135 09/01/18 0811  GLUCAP 260* 167* 144*   Lipid Profile: No results for input(s): CHOL, HDL, LDLCALC, TRIG, CHOLHDL, LDLDIRECT in the last 72 hours. Thyroid Function Tests: No results for input(s): TSH, T4TOTAL, FREET4, T3FREE, THYROIDAB in the last 72 hours. Anemia Panel: No results for input(s): VITAMINB12, FOLATE, FERRITIN, TIBC, IRON, RETICCTPCT in the last 72 hours. Urine analysis:    Component Value Date/Time   COLORURINE YELLOW 08/31/2018 0932   APPEARANCEUR CLEAR 08/31/2018 0932    LABSPEC 1.013 08/31/2018 0932   PHURINE 5.0 08/31/2018 0932   GLUCOSEU NEGATIVE 08/31/2018 0932   HGBUR MODERATE (A) 08/31/2018 0932   BILIRUBINUR NEGATIVE 08/31/2018 0932   KETONESUR NEGATIVE 08/31/2018 0932   PROTEINUR 30 (A) 08/31/2018 0932   NITRITE NEGATIVE 08/31/2018 0932   LEUKOCYTESUR LARGE (A) 08/31/2018 0932   Sepsis Labs: @LABRCNTIP (procalcitonin:4,lacticacidven:4)  ) Recent Results (from the past 240 hour(s))  Culture, blood (Routine x 2)     Status: None (Preliminary result)   Collection Time: 08/31/18  9:44 AM  Result Value Ref Range Status   Specimen Description   Final    LEFT ANTECUBITAL Performed at [facility], [place] [address], [place], [place] [address]    Special Requests   Final    BOTTLES DRAWN AEROBIC AND ANAEROBIC Blood Culture adequate volume Performed at [facility] [address], [place], [place] [address]    Culture  Setup Time   Final    GRAM NEGATIVE RODS  AEROBIC BOTTLE ONLY Organism ID to follow CRITICAL RESULT CALLED TO, READ BACK BY AND VERIFIED WITH: J.GRIMSLEY,PHARMD [id number] 09/01/18 M.CAMPBELL Performed at [facility], [place] [address] [place], [place] [address]    Culture PENDING  Incomplete   Report Status PENDING  Incomplete  Blood Culture ID Panel (Reflexed)     Status: Abnormal   Collection Time: 08/31/18  9:44 AM  Result Value Ref Range Status   Enterococcus species NOT DETECTED NOT DETECTED Final   Listeria monocytogenes NOT DETECTED NOT DETECTED Final   Staphylococcus species NOT DETECTED NOT DETECTED Final   Staphylococcus aureus (BCID) NOT DETECTED NOT DETECTED Final   Streptococcus species NOT DETECTED NOT DETECTED Final   Streptococcus agalactiae NOT DETECTED NOT DETECTED Final   Streptococcus pneumoniae NOT DETECTED NOT DETECTED Final   Streptococcus pyogenes NOT DETECTED NOT DETECTED Final   Acinetobacter baumannii NOT DETECTED NOT DETECTED Final   Enterobacteriaceae species  DETECTED (A) NOT DETECTED Final    Comment: Enterobacteriaceae represent a large family of gram-negative bacteria, not a single organism. CRITICAL RESULT CALLED TO, READ BACK BY AND VERIFIED WITH: J.GRIMSLEY,PHARMD 0630 09/01/18 M.CAMPBELL    Enterobacter cloacae complex NOT DETECTED NOT DETECTED Final   Escherichia coli DETECTED (A) NOT DETECTED Final    Comment: CRITICAL RESULT CALLED TO, READ BACK BY AND VERIFIED WITH: J.GRIMSLEY,PHARMD 1601 09/01/18 M.CAMPBELL    Klebsiella oxytoca NOT DETECTED NOT DETECTED Final   Klebsiella pneumoniae NOT DETECTED NOT DETECTED Final   Proteus species NOT DETECTED NOT DETECTED Final   Serratia marcescens NOT DETECTED NOT DETECTED Final   Carbapenem resistance NOT DETECTED NOT DETECTED Final   Haemophilus influenzae NOT DETECTED NOT DETECTED Final   Neisseria meningitidis NOT DETECTED NOT DETECTED Final   Pseudomonas aeruginosa NOT DETECTED NOT DETECTED Final   Candida albicans NOT DETECTED NOT DETECTED Final   Candida glabrata NOT DETECTED NOT DETECTED Final   Candida krusei NOT DETECTED NOT DETECTED Final   Candida parapsilosis NOT DETECTED NOT DETECTED Final   Candida tropicalis NOT DETECTED NOT DETECTED Final    Comment: Performed at [facility], [place] [address], [place], [place] [address]  Culture, blood (Routine x 2)     Status: None (Preliminary result)   Collection Time: 08/31/18  9:55 AM  Result Value Ref Range Status   Specimen Description   Final    BLOOD LEFT ARM Performed at [facility] [address], [place], [place] [address]    Special Requests   Final    BOTTLES DRAWN AEROBIC AND ANAEROBIC Blood Culture adequate volume Performed at [facility] [address], [place], [place] [address]    Culture  Setup Time   Final    GRAM NEGATIVE RODS IN BOTH AEROBIC AND ANAEROBIC BOTTLES CRITICAL VALUE NOTED.  VALUE IS CONSISTENT WITH PREVIOUSLY REPORTED AND CALLED VALUE. Performed at  [facility], [place] [address], [place], [place] [address]    Culture GRAM NEGATIVE RODS  Final   Report Status PENDING  Incomplete         Radiology Studies: Dg Chest 2 View  Result Date: 08/31/2018 CLINICAL DATA:  Fever, nausea and vomiting. EXAM: CHEST - 2 VIEW COMPARISON:  07/11/2018 FINDINGS: Lordotic technique demonstrated. Lungs are adequately inflated without focal airspace consolidation or effusion. Cardiomediastinal silhouette, bones and soft tissues are within normal. IMPRESSION: No active cardiopulmonary disease. Electronically Signed   By: Marin Olp M.D.   On: 08/31/2018 10:50   Mr Thoracic  Spine Wo Contrast  Result Date: 08/31/2018 CLINICAL DATA:  Back pain EXAM: MRI THORACIC SPINE WITHOUT CONTRAST TECHNIQUE: Multiplanar, multisequence MR imaging of the thoracic spine was performed. No intravenous contrast was administered. COMPARISON:  Chest radiograph 08/31/2018 and lumbar spine MRI from 08/31/2018 FINDINGS: Alignment:  No vertebral subluxation is observed. Vertebrae: Mild grade 1 degenerative endplate findings at K4-4, T4-5, T5-6, and along the inferior endplate of T6, especially visible long the corners of the vertebra. No edema along the corners to further suggest ankylosing spondylitis. Mild disc desiccation throughout much of the thoracic spine. No acute thoracic spine compression fracture or vertebral edema. Cord:  No significant abnormal spinal cord signal is observed. Paraspinal and other soft tissues: Unremarkable Disc levels: T1-2: Unremarkable. T2-3: Unremarkable. T3-4: No impingement.  Mild disc bulge. T4-5: Unremarkable. T5-6: Unremarkable. T6-7: Unremarkable. T7-8: Unremarkable. T8-9: No impingement.  Mild disc bulge. T9-10: No impingement.  Mild disc bulge. T10-11: Unremarkable. T11-12: No impingement. Shallow left paracentral disc protrusion on image 35/9. T12-L1: Unremarkable. IMPRESSION: 1. No impingement in the thoracic spine to explain the patient's back  pain. 2. Minimal degenerative disc disease as noted above. 3. Mild degenerative endplate findings at multiple levels. Electronically Signed   By: Van Clines M.D.   On: 08/31/2018 16:58   Mr Lumbar Spine Wo Contrast  Result Date: 08/31/2018 CLINICAL DATA:  Back pain. EXAM: MRI LUMBAR SPINE WITHOUT CONTRAST TECHNIQUE: Multiplanar, multisequence MR imaging of the lumbar spine was performed. No intravenous contrast was administered. COMPARISON:  CT abdomen 08/31/2018 FINDINGS: Segmentation: The lowest lumbar type non-rib-bearing vertebra is labeled as L5. Alignment:  No vertebral subluxation is observed. Vertebrae: Marrow heterogeneity is present. Although this can be caused by marrow infiltrative processes, the most common causes include anemia, smoking, obesity, or advancing age. Hemangiomas in the L1, L3, and L4 vertebra. No significant vertebral marrow edema is identified. Conus medullaris and cauda equina: Conus extends to the L1 level. Conus and cauda equina appear normal. Paraspinal and other soft tissues: Unremarkable Disc levels: L1-2: Unremarkable. L2-3: Unremarkable. L3-4: No impingement.  Mild disc bulge. L4-5: No impingement.  Mild disc bulge. L5-S1: Borderline right foraminal impingement due to facet arthropathy. Tapered thecal sac at this level. No overt impingement. IMPRESSION: 1. Borderline right foraminal narrowing at L5-S1 due to facet arthropathy, but no overt impingement. 2. Mild disc bulges at L3-4 and L4-5 without impingement. 3. Marrow heterogeneity is present. Although this can be caused by marrow infiltrative processes, the most common causes include anemia, smoking, obesity, or advancing age. 4. Hemangiomas in the L1, L3, and L4 vertebral body (benign). Electronically Signed   By: Van Clines M.D.   On: 08/31/2018 16:42   Ct Renal Stone Study  Result Date: 08/31/2018 CLINICAL DATA:  Pt presents from her home via EMS with c/o back pain on the lower left side. Upon EMS  arrival, pt was noted to have a fever of 102.2. Pt is moaning and groaning in the bed, normal at baseline per EMS. Pt also reports a hx of tremors at baseline and urinary incontinence. Pt did report 3 vomiting episodes at her house prior to EMS arrival. EXAM: CT ABDOMEN AND PELVIS WITHOUT CONTRAST TECHNIQUE: Multidetector CT imaging of the abdomen and pelvis was performed following the standard protocol without IV contrast. COMPARISON:  11/16/2017 FINDINGS: Lower chest: Minor basilar atelectasis. Heart normal in size. No acute findings Hepatobiliary: No focal liver abnormality is seen. No gallstones, gallbladder wall thickening, or biliary dilatation. Pancreas: Unremarkable. No pancreatic ductal dilatation or  surrounding inflammatory changes. Spleen: Normal in size without focal abnormality. Adrenals/Urinary Tract: Adrenal glands are unremarkable. Kidneys are normal, without renal calculi, focal lesion, or hydronephrosis. Bladder is unremarkable. Stomach/Bowel: Stomach is within normal limits. Appendix appears normal. No evidence of bowel wall thickening, distention, or inflammatory changes. Vascular/Lymphatic: Aortic atherosclerosis. No enlarged abdominal or pelvic lymph nodes. Reproductive: Status post hysterectomy. No adnexal masses. Other: No abdominal wall hernia or abnormality. No abdominopelvic ascites. Musculoskeletal: No acute or significant osseous findings. IMPRESSION: 1. No acute findings within the abdomen or pelvis. 2. No renal or ureteral stones or obstructive uropathy. No findings to account for the patient's back or left-sided pain. 3. Aortic atherosclerosis. Electronically Signed   By: Lajean Manes M.D.   On: 08/31/2018 11:23        Scheduled Meds: . atropine  1 drop Both Eyes Daily  . buPROPion  150 mg Oral Daily  . busPIRone  10 mg Oral BID  . insulin aspart  0-15 Units Subcutaneous TID WC  . insulin aspart  0-5 Units Subcutaneous QHS  . levothyroxine  50 mcg Oral Q0600  .  pantoprazole  40 mg Oral Daily  . pravastatin  40 mg Oral Daily  . rivaroxaban  20 mg Oral Daily   Continuous Infusions: . sodium chloride 100 mL/hr at 09/01/18 0600  . cefTRIAXone (ROCEPHIN)  IV Stopped (08/31/18 1952)     LOS: 1 day    Time spent: 35 minutes    Edwin Dada, MD [facility] 09/01/2018, 8:26 AM     Pager [phone] --- please page though AMION:  [email or web address] If 7PM-7AM, please contact night-coverage

## 2018-09-01 NOTE — Plan of Care (Signed)

## 2018-09-02 LAB — GLUCOSE, CAPILLARY
GLUCOSE-CAPILLARY: 156 mg/dL — AB (ref 70–99)
GLUCOSE-CAPILLARY: 238 mg/dL — AB (ref 70–99)
Glucose-Capillary: 228 mg/dL — ABNORMAL HIGH (ref 70–99)
Glucose-Capillary: 272 mg/dL — ABNORMAL HIGH (ref 70–99)

## 2018-09-02 LAB — BASIC METABOLIC PANEL
ANION GAP: 8 (ref 5–15)
BUN: 25 mg/dL — ABNORMAL HIGH (ref 8–23)
CHLORIDE: 108 mmol/L (ref 98–111)
CO2: 24 mmol/L (ref 22–32)
Calcium: 8.4 mg/dL — ABNORMAL LOW (ref 8.9–10.3)
Creatinine, Ser: 1.75 mg/dL — ABNORMAL HIGH (ref 0.44–1.00)
GFR calc Af Amer: 35 mL/min — ABNORMAL LOW (ref >60)
GFR, EST NON AFRICAN AMERICAN: 30 mL/min — AB (ref >60)
GLUCOSE: 208 mg/dL — AB (ref 70–99)
POTASSIUM: 4.2 mmol/L (ref 3.5–5.1)
Sodium: 140 mmol/L (ref 135–145)

## 2018-09-02 LAB — CBC
HCT: 30.7 % — ABNORMAL LOW (ref 36.0–46.0)
HEMOGLOBIN: 9.4 g/dL — AB (ref 12.0–15.0)
MCH: 29.5 pg (ref 26.0–34.0)
MCHC: 30.6 g/dL (ref 30.0–36.0)
MCV: 96.2 fL (ref 80.0–100.0)
NRBC: 0 % (ref 0.0–0.2)
PLATELETS: 213 10*3/uL (ref 150–400)
RBC: 3.19 MIL/uL — AB (ref 3.87–5.11)
RDW: 14.6 % (ref 11.5–15.5)
WBC: 7.3 10*3/uL (ref 4.0–10.5)

## 2018-09-02 MED ORDER — RIVAROXABAN 20 MG PO TABS: 20.0000 mg | ORAL_TABLET | Freq: Every day | ORAL | Status: DC

## 2018-09-02 MED ORDER — INSULIN ASPART 100 UNIT/ML ~~LOC~~ SOLN
0.0000 [IU] | Freq: Three times a day (TID) | SUBCUTANEOUS | Status: DC
  Administered 2018-09-03: 4 [IU] via SUBCUTANEOUS

## 2018-09-02 NOTE — Progress Notes (Signed)
PROGRESS NOTE    Jennifer Foley  JJH:[id number] DOB: April 22, 1956 DOA: 08/31/2018 PCP: Bartholome Bill, MD      Brief Narrative:  Jennifer Foley is a 62 y.o. 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  In ER CT negative for hydro or pyelo, but UA+, started on CTX for UTI.   Assessment & Plan:  Sepsis from UTI E coli bacteremia Lactate greater than 2 admission, heart rate greater than 120, febrile to 101F, blood pressure 92/57, improved with fluids.  Cultures now growing E. coli in 2 of 2.  CT renal protocol without contrast showed no evidence of complications from pyelonephritis. -Continue ceftriaxone   AKI on CKD IV Baseline Cr 1.5-1.8, admitted at 1.9, improved now back to baseline.  Likely from sepsis. -Trend BMP -Hold Lasix, lisinopril, HCTZ    COPD without exacerbation -Continue home Symbicort as Dulera -Patient is allergic to albuterol  OSA on CPAP -Continue CPAP at night  Hypertension Blood pressure normal off blood pressure medicines -Continue to hold lisinopril, HCTZ, Lasix until hemodynamics clearer  Diabetes Diet controlled.  Hemoglobin A1c 6.9% in August outside records. --Continue sliding scale, increased dose Continue pravastatin  Paroxysmal atrial fibrillation Chads 2 Vascor 3.  On Xarelto.  Currently in sinus. 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  Followed by Dr. Manuella Ghazi, optho. -Continue prednisone, adjust to stress dosing for 3 days -Continue eye drops  Hypothyroidism  -Continue levothyroxine  Anemia of chornic disease Hgb drop from 12 --> 9 g/dL initially, now stable near baseline 10 g/dL, likely dilutional.  No clinical bleeding. -Trend CBC -Restart iron on discharge  Other medications -Continue Wellbutrin, BuSpar, pantoprazole  Morbid obesity BMI >40, with HTN, DM.       MDM and  disposition: The below labs and imaging reports reviewed and summarized above.  Medication management as above.  The patient was admitted with sepsis, found to have E. coli bacteremia and AKI.  AKI is resolving, but she still has an acute severe illness with electrolyte of bowel function, in the form of E. coli bacteremia.  Her fevers are improving, will continue IV antibiotics, IV fluids and closely monitoring her electrolytes and renal function as we await transition to oral antibiotics for discharge.           DVT prophylaxis: Xarelto Code Status: FULL Family Communication: daughter at bedisde    Consultants:   None  Procedures:   None  Antimicrobials:   Ceftriaxone 11/09 >>    Subjective: Still somewhat tired, malaise, some low back pain, no fever overnight, no vomiting, confusion, syncope.  A lot of coughing, no sputum.  Objective: Vitals:   09/01/18 0551 09/01/18 2014 09/02/18 0518 09/02/18 1254  BP:  122/74 114/83 120/69  Pulse:  (!) 108 84 86  Resp:  20 18   Temp: (!) 100.9 F (38.3 C) 98.2 F (36.8 C) 98.9 F (37.2 C) 99.1 F (37.3 C)  TempSrc: Oral Oral Oral Oral  SpO2:  93% 100% 97%  Weight:      Height:        Intake/Output Summary (Last 24 hours) at 09/02/2018 1737 Last data filed at 09/02/2018 0600 Gross per 24 hour  Intake 1594.7 ml  Output -  Net 1594.7 ml   Filed Weights   08/31/18 0920  Weight: 101.6 kg    Examination: General appearance: Obese adult female, no acute distress, sitting in  bed. HEENT: Anicteric, conjunctival pink, lids lashes normal, I do not appreciate iris deformity, oropharynx moist, no oral lesions, hearing normal.   Skin: Warm and dry.  No jaundice.  No suspicious rashes or lesions. Cardiac: RRR, no murmurs appreciated, no lower extremity edema, JVP not visible due to body habitus Respiratory: Frequently coughing, some bilateral wheezing, no rales. Abdomen: Abdomen soft, no tenderness to palpation or voluntary  guarding, no rebound or rigidity.  No CVA tenderness. MSK: No muscle deformities or effusions of the large joints of the upper or lower extremities bilaterally.  No clubbing.  Normal muscle bulk and tone. Neuro: Awake and alert, extraocular movements intact, moves all extremities with normal strength and equal coordination, speech 4..    Psych: Sensorium intact responding to questions, attention normal, affect normal, judgment and insight appear normal.   Data Reviewed: I have personally reviewed following labs and imaging studies:  CBC: Recent Labs  Lab 08/31/18 0955 09/01/18 0439 09/02/18 0449  WBC 16.1* 13.2* 7.3  NEUTROABS 14.2*  --   --   HGB 12.0 9.7* 9.4*  HCT 38.9 32.0* 30.7*  MCV 98.2 99.7 96.2  PLT 215 197 599   Basic Metabolic Panel: Recent Labs  Lab 08/31/18 0955 09/01/18 0439 09/02/18 0449  NA 141 136 140  K 3.7 4.0 4.2  CL 101 103 108  CO2 27 25 24   GLUCOSE 183* 179* 208*  BUN 30* 32* 25*  CREATININE 1.94* 2.15* 1.75*  CALCIUM 9.1 8.0* 8.4*   GFR: Estimated Creatinine Clearance: 36.5 mL/min (A) (by C-G formula based on SCr of 1.75 mg/dL (H)). Liver Function Tests: Recent Labs  Lab 08/31/18 0955  AST 17  ALT 19  ALKPHOS 77  BILITOT 0.7  PROT 7.3  ALBUMIN 3.8   No results for input(s): LIPASE, AMYLASE in the last 168 hours. No results for input(s): AMMONIA in the last 168 hours. Coagulation Profile: Recent Labs  Lab 08/31/18 0955  INR 1.19   Cardiac Enzymes: No results for input(s): CKTOTAL, CKMB, CKMBINDEX, TROPONINI in the last 168 hours. BNP (last 3 results) No results for input(s): PROBNP in the last 8760 hours. HbA1C: No results for input(s): HGBA1C in the last 72 hours. CBG: Recent Labs  Lab 09/01/18 1218 09/01/18 1705 09/01/18 2137 09/02/18 0732 09/02/18 1239  GLUCAP 268* 264* 197* 156* 228*   Lipid Profile: No results for input(s): CHOL, HDL, LDLCALC, TRIG, CHOLHDL, LDLDIRECT in the last 72 hours. Thyroid Function  Tests: No results for input(s): TSH, T4TOTAL, FREET4, T3FREE, THYROIDAB in the last 72 hours. Anemia Panel: No results for input(s): VITAMINB12, FOLATE, FERRITIN, TIBC, IRON, RETICCTPCT in the last 72 hours. Urine analysis:    Component Value Date/Time   COLORURINE YELLOW 08/31/2018 0932   APPEARANCEUR CLEAR 08/31/2018 0932   LABSPEC 1.013 08/31/2018 0932   PHURINE 5.0 08/31/2018 0932   GLUCOSEU NEGATIVE 08/31/2018 0932   HGBUR MODERATE (A) 08/31/2018 0932   BILIRUBINUR NEGATIVE 08/31/2018 0932   KETONESUR NEGATIVE 08/31/2018 0932   PROTEINUR 30 (A) 08/31/2018 0932   NITRITE NEGATIVE 08/31/2018 0932   LEUKOCYTESUR LARGE (A) 08/31/2018 0932   Sepsis Labs: @LABRCNTIP (procalcitonin:4,lacticacidven:4)  ) Recent Results (from the past 240 hour(s))  Culture, blood (Routine x 2)     Status: Abnormal (Preliminary result)   Collection Time: 08/31/18  9:44 AM  Result Value Ref Range Status   Specimen Description   Final    LEFT ANTECUBITAL Performed at [facility], [place] [address] [place], [place] [address]  Special Requests   Final    BOTTLES DRAWN AEROBIC AND ANAEROBIC Blood Culture adequate volume Performed at [place] [address] [place], [place] [address]    Culture  Setup Time   Final    GRAM NEGATIVE RODS AEROBIC BOTTLE ONLY CRITICAL RESULT CALLED TO, READ BACK BY AND VERIFIED WITH: J.GRIMSLEY,PHARMD [id number] 09/01/18 M.CAMPBELL Performed at [facility], [place] [address] [place], [place] [address]    Culture ESCHERICHIA COLI (A)  Final   Report Status PENDING  Incomplete  Blood Culture ID Panel (Reflexed)     Status: Abnormal   Collection Time: 08/31/18  9:44 AM  Result Value Ref Range Status   Enterococcus species NOT DETECTED NOT DETECTED Final   Listeria monocytogenes NOT DETECTED NOT DETECTED Final   Staphylococcus species NOT DETECTED NOT DETECTED Final   Staphylococcus aureus (BCID) NOT DETECTED NOT DETECTED Final    Streptococcus species NOT DETECTED NOT DETECTED Final   Streptococcus agalactiae NOT DETECTED NOT DETECTED Final   Streptococcus pneumoniae NOT DETECTED NOT DETECTED Final   Streptococcus pyogenes NOT DETECTED NOT DETECTED Final   Acinetobacter baumannii NOT DETECTED NOT DETECTED Final   Enterobacteriaceae species DETECTED (A) NOT DETECTED Final    Comment: Enterobacteriaceae represent a large family of gram-negative bacteria, not a single organism. CRITICAL RESULT CALLED TO, READ BACK BY AND VERIFIED WITH: J.GRIMSLEY,PHARMD [id number] 09/01/18 M.CAMPBELL    Enterobacter cloacae complex NOT DETECTED NOT DETECTED Final   Escherichia coli DETECTED (A) NOT DETECTED Final    Comment: CRITICAL RESULT CALLED TO, READ BACK BY AND VERIFIED WITH: J.GRIMSLEY,PHARMD [id number] 09/01/18 M.CAMPBELL    Klebsiella oxytoca NOT DETECTED NOT DETECTED Final   Klebsiella pneumoniae NOT DETECTED NOT DETECTED Final   Proteus species NOT DETECTED NOT DETECTED Final   Serratia marcescens NOT DETECTED NOT DETECTED Final   Carbapenem resistance NOT DETECTED NOT DETECTED Final   Haemophilus influenzae NOT DETECTED NOT DETECTED Final   Neisseria meningitidis NOT DETECTED NOT DETECTED Final   Pseudomonas aeruginosa NOT DETECTED NOT DETECTED Final   Candida albicans NOT DETECTED NOT DETECTED Final   Candida glabrata NOT DETECTED NOT DETECTED Final   Candida krusei NOT DETECTED NOT DETECTED Final   Candida parapsilosis NOT DETECTED NOT DETECTED Final   Candida tropicalis NOT DETECTED NOT DETECTED Final    Comment: Performed at [facility], [place] [address] [place], [place] [address]  Culture, blood (Routine x 2)     Status: None (Preliminary result)   Collection Time: 08/31/18  9:55 AM  Result Value Ref Range Status   Specimen Description   Final    BLOOD LEFT ARM Performed at [facility] [address] [place], [place] [address]    Special Requests   Final    BOTTLES DRAWN AEROBIC AND  ANAEROBIC Blood Culture adequate volume Performed at [place] [address], [place], [place] [address]    Culture  Setup Time   Final    GRAM NEGATIVE RODS IN BOTH AEROBIC AND ANAEROBIC BOTTLES CRITICAL VALUE NOTED.  VALUE IS CONSISTENT WITH PREVIOUSLY REPORTED AND CALLED VALUE. Performed at [facility], [place] [address], [place], [place] [address]    Culture GRAM NEGATIVE RODS  Final   Report Status PENDING  Incomplete         Radiology Studies: No results found.      Scheduled Meds: . atropine  1 drop Both Eyes Daily  . buPROPion  150 mg Oral  Daily  . busPIRone  10 mg Oral BID  . insulin aspart  0-15 Units Subcutaneous TID WC  . insulin aspart  0-5 Units Subcutaneous QHS  . levothyroxine  50 mcg Oral Q0600  . mometasone-formoterol  2 puff Inhalation BID  . pantoprazole  40 mg Oral Daily  . pravastatin  40 mg Oral Daily  . predniSONE  20 mg Oral Q breakfast   Followed by  . [START ON 09/04/2018] predniSONE  10 mg Oral Q breakfast  . [START ON 09/03/2018] rivaroxaban  20 mg Oral Q supper   Continuous Infusions: . cefTRIAXone (ROCEPHIN)  IV Stopped (09/01/18 1750)     LOS: 2 days    Time spent: 35 minutes    Edwin Dada, MD [facility] 09/02/2018, 5:37 PM     Pager [phone] --- please page though AMION:  [email or web address] If 7PM-7AM, please contact night-coverage

## 2018-09-02 NOTE — Plan of Care (Signed)

## 2018-09-02 NOTE — Progress Notes (Signed)
Patient did not want to wear her CPAP tonight. Asked her to call if she changes her mind.

## 2018-09-03 ENCOUNTER — Other Ambulatory Visit: Payer: Self-pay

## 2018-09-03 DIAGNOSIS — I5032 Chronic diastolic (congestive) heart failure: Secondary | ICD-10-CM

## 2018-09-03 DIAGNOSIS — Z6841 Body Mass Index (BMI) 40.0 and over, adult: Secondary | ICD-10-CM

## 2018-09-03 LAB — CBC
HEMATOCRIT: 30.2 % — AB (ref 36.0–46.0)
Hemoglobin: 9.4 g/dL — ABNORMAL LOW (ref 12.0–15.0)
MCH: 29.7 pg (ref 26.0–34.0)
MCHC: 31.1 g/dL (ref 30.0–36.0)
MCV: 95.6 fL (ref 80.0–100.0)
Platelets: 234 10*3/uL (ref 150–400)
RBC: 3.16 MIL/uL — AB (ref 3.87–5.11)
RDW: 14.4 % (ref 11.5–15.5)
WBC: 8.2 10*3/uL (ref 4.0–10.5)
nRBC: 0.2 % (ref 0.0–0.2)

## 2018-09-03 LAB — BASIC METABOLIC PANEL
Anion gap: 9 (ref 5–15)
BUN: 19 mg/dL (ref 8–23)
CO2: 24 mmol/L (ref 22–32)
CREATININE: 1.45 mg/dL — AB (ref 0.44–1.00)
Calcium: 8.7 mg/dL — ABNORMAL LOW (ref 8.9–10.3)
Chloride: 107 mmol/L (ref 98–111)
GFR calc Af Amer: 44 mL/min — ABNORMAL LOW (ref >60)
GFR calc non Af Amer: 38 mL/min — ABNORMAL LOW (ref >60)
Glucose, Bld: 199 mg/dL — ABNORMAL HIGH (ref 70–99)
POTASSIUM: 4 mmol/L (ref 3.5–5.1)
Sodium: 140 mmol/L (ref 135–145)

## 2018-09-03 LAB — GLUCOSE, CAPILLARY
GLUCOSE-CAPILLARY: 153 mg/dL — AB (ref 70–99)
Glucose-Capillary: 118 mg/dL — ABNORMAL HIGH (ref 70–99)

## 2018-09-03 MED ORDER — GUAIFENESIN-DM 100-10 MG/5ML PO SYRP
5.0000 mL | ORAL_SOLUTION | ORAL | Status: DC | PRN
  Administered 2018-09-03: 5 mL via ORAL
  Filled 2018-09-03: qty 10

## 2018-09-03 MED ORDER — CEPHALEXIN 500 MG PO CAPS: 500.0000 mg | ORAL_CAPSULE | Freq: Two times a day (BID) | ORAL | 0 refills | Status: AC

## 2018-09-03 MED ORDER — CEPHALEXIN 500 MG PO CAPS: 500.0000 mg | ORAL_CAPSULE | Freq: Two times a day (BID) | ORAL | 0 refills | Status: DC

## 2018-09-03 MED ORDER — SALINE SPRAY 0.65 % NA SOLN
1.0000 | NASAL | Status: DC | PRN
  Administered 2018-09-03: 1 via NASAL
  Filled 2018-09-03: qty 44

## 2018-09-03 NOTE — Discharge Summary (Signed)
Physician Discharge Summary  Jennifer Foley NKN:[id number] DOB: 07-07-1956 DOA: 08/31/2018  PCP: Bartholome Bill, MD  Admit date: 08/31/2018 Discharge date: 09/03/2018  Admitted From: Home  Disposition:  Home   Recommendations for Outpatient Follow-up:  1. Follow up with PCP in 1 week 2. Please obtain BMP in 1 week 3. Please restart lisinopril/HCTZ if needed 4. If still weak, consider PT referral   Home Health: None  Equipment/Devices: None  Discharge Condition: Good  CODE STATUS: FULL Diet recommendation: Diabetic  Brief/Interim Summary: Jennifer Foley is a 62 y.o. 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  In ER CT negative for hydro or pyelo, but UA+, started on CTX for UTI.       PRINCIPAL HOSPITAL DIAGNOSIS: Sepsis from E coli pyelonephritis and bacteremia    Discharge Diagnoses:   Sepsis from UTI E coli bacteremia Lactate greater than 2 admission, heart rate greater than 120, febrile to 101F, blood pressure 92/57, improved with fluids.  Cultures grew E. coli in 2 of 2.  CT renal protocol without contrast showed no evidence of complications from pyelonephritis.  She defervesced quickly and had no persistent fevers nor clinical signs of satellite focus of infection.  Culture of blood sensitive to cephalosporins, discussed with ID pharmacist.  Discharged to complete 10 days total antibiotics Keflex.   CKD IV Baseline Cr 1.5-1.8, admitted at 1.9, improved now back to baseline.  Likely from sepsis.  Lisinopril/HCTZ held at discharge, to be restarted by PCP if Cr stable.  COPD without exacerbation  OSA on CPAP  Hypertension Blood pressure normal off blood pressure medicines.   ACEi held, Lasix restarted at discharge.  Diabetes Diet controlled.  Hemoglobin A1c 6.9% in August outside records.  Paroxysmal atrial fibrillation Chads 2  Vascor 3.  On Xarelto.  Currently in sinus.  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  Followed by Jennifer Foley, optho.  Given stress dose steroids.  Hypothyroidism   Anemia of chornic disease Hgb drop from 12 --> 9 g/dL initially, now stable near baseline 10 g/dL, likely dilutional.  No clinical bleeding.  Morbid obesity BMI >40, with HTN, DM.            Discharge Instructions  Discharge Instructions    Diet - low sodium heart healthy   Complete by:  As directed    Discharge instructions   Complete by:  As directed    From Jennifer Foley: You were admitted for sepsis from a kidney infection. This infection had spread to your blood stream. We were able to grow the bacteria in the lab, and it is a bacteria that can be killed by several good antibiotics.  One of those is cephalexin/Keflex:  Take Keflex 500 mg twice daily for 8 more days.  While you are taking the antibiotic, you can take a probiotic (healthy bacteria) to prevent diarrhea.  Call your primary care doctor, Jennifer Foley, to schedule a follow up appointment in 1 week. Have her check your lab work.  Restart your home medicines. Restart your home Lasix but for now DO NOT RESTART YOUR LISINOPRIL/HCTZ  Do not restart your lisinopril HCTZ until you see Jennifer Foley.  She can check your lab work and restart it if she needs to.   Increase activity slowly   Complete by:  As directed      Allergies as of 09/03/2018      Reactions  Contrast Media [iodinated Diagnostic Agents]    Doxycycline    Albuterol Sulfate Rash   Codeine Other (See Comments)   Breaks out in a rash   Erythromycin Base Diarrhea, Rash      Medication List    TAKE these medications   acetaminophen 500 MG tablet Commonly known as:  TYLENOL Take 1,000 mg by mouth daily as needed for mild pain.   Adalimumab 40 MG/0.8ML Pnkt Inject into the skin.   ammonium lactate 12 % lotion Commonly known as:  LAC-HYDRIN Apply 1 application  topically daily as needed for dry skin.   atropine 1 % ophthalmic solution Place 1 drop into both eyes daily.   benzonatate 100 MG capsule Commonly known as:  TESSALON Take 1 capsule (100 mg total) by mouth every 8 (eight) hours.   buPROPion 150 MG 24 hr tablet Commonly known as:  WELLBUTRIN XL Take 150 mg by mouth daily.   busPIRone 10 MG tablet Commonly known as:  BUSPAR Take 10 mg by mouth 2 (two) times daily.   cephALEXin 500 MG capsule Commonly known as:  KEFLEX Take 1 capsule (500 mg total) by mouth 2 (two) times daily for 8 days.   cyclobenzaprine 10 MG tablet Commonly known as:  FLEXERIL Take 10 mg by mouth 3 (three) times daily as needed for muscle spasms.   DUREZOL 0.05 % Emul Generic drug:  Difluprednate Place 1 drop into both eyes 4 (four) times daily.   ferrous gluconate 324 MG tablet Commonly known as:  FERGON Take 324 mg by mouth 2 (two) times daily.   folic acid 1 MG tablet Commonly known as:  FOLVITE Take 1 mg by mouth daily.   furosemide 40 MG tablet Commonly known as:  LASIX Take 40 mg by mouth daily.   levothyroxine 50 MCG tablet Commonly known as:  SYNTHROID, LEVOTHROID Take 50 mcg by mouth daily before breakfast.   lisinopril-hydrochlorothiazide 20-12.5 MG tablet Commonly known as:  PRINZIDE,ZESTORETIC Take 1 tablet by mouth daily.   methotrexate 2.5 MG tablet Take 15 mg by mouth once a week.   omeprazole 20 MG capsule Commonly known as:  PRILOSEC Take 20 mg by mouth daily.   potassium chloride 10 MEQ tablet Commonly known as:  K-DUR,KLOR-CON Take 10 mEq by mouth daily.   pravastatin 40 MG tablet Commonly known as:  PRAVACHOL Take 40 mg by mouth daily.   predniSONE 10 MG tablet Commonly known as:  DELTASONE Take 10 mg by mouth daily.   rivaroxaban 20 MG Tabs tablet Commonly known as:  XARELTO Take 20 mg by mouth every evening.   SYMBICORT 160-4.5 MCG/ACT inhaler Generic drug:  budesonide-formoterol Inhale 2 puffs into the  lungs 2 (two) times daily.       Allergies  Allergen Reactions  . Contrast Media [Iodinated Diagnostic Agents]   . Doxycycline   . Albuterol Sulfate Rash  . Codeine Other (See Comments)    Breaks out in a rash  . Erythromycin Base Diarrhea and Rash    Consultations:  None   Procedures/Studies: Dg Chest 2 View  Result Date: 08/31/2018 CLINICAL DATA:  Fever, nausea and vomiting. EXAM: CHEST - 2 VIEW COMPARISON:  07/11/2018 FINDINGS: Lordotic technique demonstrated. Lungs are adequately inflated without focal airspace consolidation or effusion. Cardiomediastinal silhouette, bones and soft tissues are within normal. IMPRESSION: No active cardiopulmonary disease. Electronically Signed   By: Marin Olp M.D.   On: 08/31/2018 10:50   Mr Thoracic Spine Wo Contrast  Result Date: 08/31/2018 CLINICAL  DATA:  Back pain EXAM: MRI THORACIC SPINE WITHOUT CONTRAST TECHNIQUE: Multiplanar, multisequence MR imaging of the thoracic spine was performed. No intravenous contrast was administered. COMPARISON:  Chest radiograph 08/31/2018 and lumbar spine MRI from 08/31/2018 FINDINGS: Alignment:  No vertebral subluxation is observed. Vertebrae: Mild grade 1 degenerative endplate findings at Z8-5, T4-5, T5-6, and along the inferior endplate of T6, especially visible long the corners of the vertebra. No edema along the corners to further suggest ankylosing spondylitis. Mild disc desiccation throughout much of the thoracic spine. No acute thoracic spine compression fracture or vertebral edema. Cord:  No significant abnormal spinal cord signal is observed. Paraspinal and other soft tissues: Unremarkable Disc levels: T1-2: Unremarkable. T2-3: Unremarkable. T3-4: No impingement.  Mild disc bulge. T4-5: Unremarkable. T5-6: Unremarkable. T6-7: Unremarkable. T7-8: Unremarkable. T8-9: No impingement.  Mild disc bulge. T9-10: No impingement.  Mild disc bulge. T10-11: Unremarkable. T11-12: No impingement. Shallow left  paracentral disc protrusion on image 35/9. T12-L1: Unremarkable. IMPRESSION: 1. No impingement in the thoracic spine to explain the patient's back pain. 2. Minimal degenerative disc disease as noted above. 3. Mild degenerative endplate findings at multiple levels. Electronically Signed   By: Van Clines M.D.   On: 08/31/2018 16:58   Mr Lumbar Spine Wo Contrast  Result Date: 08/31/2018 CLINICAL DATA:  Back pain. EXAM: MRI LUMBAR SPINE WITHOUT CONTRAST TECHNIQUE: Multiplanar, multisequence MR imaging of the lumbar spine was performed. No intravenous contrast was administered. COMPARISON:  CT abdomen 08/31/2018 FINDINGS: Segmentation: The lowest lumbar type non-rib-bearing vertebra is labeled as L5. Alignment:  No vertebral subluxation is observed. Vertebrae: Marrow heterogeneity is present. Although this can be caused by marrow infiltrative processes, the most common causes include anemia, smoking, obesity, or advancing age. Hemangiomas in the L1, L3, and L4 vertebra. No significant vertebral marrow edema is identified. Conus medullaris and cauda equina: Conus extends to the L1 level. Conus and cauda equina appear normal. Paraspinal and other soft tissues: Unremarkable Disc levels: L1-2: Unremarkable. L2-3: Unremarkable. L3-4: No impingement.  Mild disc bulge. L4-5: No impingement.  Mild disc bulge. L5-S1: Borderline right foraminal impingement due to facet arthropathy. Tapered thecal sac at this level. No overt impingement. IMPRESSION: 1. Borderline right foraminal narrowing at L5-S1 due to facet arthropathy, but no overt impingement. 2. Mild disc bulges at L3-4 and L4-5 without impingement. 3. Marrow heterogeneity is present. Although this can be caused by marrow infiltrative processes, the most common causes include anemia, smoking, obesity, or advancing age. 4. Hemangiomas in the L1, L3, and L4 vertebral body (benign). Electronically Signed   By: Van Clines M.D.   On: 08/31/2018 16:42   Ct  Renal Stone Study  Result Date: 08/31/2018 CLINICAL DATA:  Pt presents from her home via EMS with c/o back pain on the lower left side. Upon EMS arrival, pt was noted to have a fever of 102.2. Pt is moaning and groaning in the bed, normal at baseline per EMS. Pt also reports a hx of tremors at baseline and urinary incontinence. Pt did report 3 vomiting episodes at her house prior to EMS arrival. EXAM: CT ABDOMEN AND PELVIS WITHOUT CONTRAST TECHNIQUE: Multidetector CT imaging of the abdomen and pelvis was performed following the standard protocol without IV contrast. COMPARISON:  11/16/2017 FINDINGS: Lower chest: Minor basilar atelectasis. Heart normal in size. No acute findings Hepatobiliary: No focal liver abnormality is seen. No gallstones, gallbladder wall thickening, or biliary dilatation. Pancreas: Unremarkable. No pancreatic ductal dilatation or surrounding inflammatory changes. Spleen: Normal in size without  focal abnormality. Adrenals/Urinary Tract: Adrenal glands are unremarkable. Kidneys are normal, without renal calculi, focal lesion, or hydronephrosis. Bladder is unremarkable. Stomach/Bowel: Stomach is within normal limits. Appendix appears normal. No evidence of bowel wall thickening, distention, or inflammatory changes. Vascular/Lymphatic: Aortic atherosclerosis. No enlarged abdominal or pelvic lymph nodes. Reproductive: Status post hysterectomy. No adnexal masses. Other: No abdominal wall hernia or abnormality. No abdominopelvic ascites. Musculoskeletal: No acute or significant osseous findings. IMPRESSION: 1. No acute findings within the abdomen or pelvis. 2. No renal or ureteral stones or obstructive uropathy. No findings to account for the patient's back or left-sided pain. 3. Aortic atherosclerosis. Electronically Signed   By: Lajean Manes M.D.   On: 08/31/2018 11:23       Subjective: Feeling well.  A little tired, but near baseline.  No new fever, vomiting, confusion.  No flank pain.   Back pain is improved.    Discharge Exam: Vitals:   09/03/18 0553 09/03/18 0904  BP: 128/87   Pulse: 75   Resp: 18   Temp: 98 F (36.7 C)   SpO2: 96% 95%   Vitals:   09/02/18 1254 09/02/18 2133 09/03/18 0553 09/03/18 0904  BP: 120/69 (!) 118/94 128/87   Pulse: 86 84 75   Resp:  18 18   Temp: 99.1 F (37.3 C) 98.7 F (37.1 C) 98 F (36.7 C)   TempSrc: Oral Oral Oral   SpO2: 97% 96% 96% 95%  Weight:      Height:        General: Pt is alert, awake, not in acute distress, lying in bed Cardiovascular: RRR, nl S1-S2, no murmurs appreciated.   No LE edema.   Respiratory: Normal respiratory rate and rhythm.  CTAB without rales or wheezes. Abdominal: Abdomen soft and non-tender.  No distension or HSM.   Neuro/Psych: Strength symmetric in upper and lower extremities.  Judgment and insight appear normal.   The results of significant diagnostics from this hospitalization (including imaging, microbiology, ancillary and laboratory) are listed below for reference.     Microbiology: Recent Results (from the past 240 hour(s))  Culture, blood (Routine x 2)     Status: Abnormal (Preliminary result)   Collection Time: 08/31/18  9:44 AM  Result Value Ref Range Status   Specimen Description LEFT ANTECUBITAL  Final   Special Requests   Final    BOTTLES DRAWN AEROBIC AND ANAEROBIC Blood Culture adequate volume   Culture  Setup Time   Final    GRAM NEGATIVE RODS AEROBIC BOTTLE ONLY CRITICAL RESULT CALLED TO, READ BACK BY AND VERIFIED WITH: J.GRIMSLEY,PHARMD [id number] 09/01/18 M.CAMPBELL    Culture ESCHERICHIA COLI (A)  Final   Report Status PENDING  Incomplete   Organism ID, Bacteria ESCHERICHIA COLI  Final      Susceptibility   Escherichia coli - MIC*    AMPICILLIN >=32 RESISTANT Resistant     CEFAZOLIN <=4 SENSITIVE Sensitive     CEFEPIME <=1 SENSITIVE Sensitive     CEFTAZIDIME <=1 SENSITIVE Sensitive     CEFTRIAXONE <=1 SENSITIVE Sensitive     CIPROFLOXACIN <=0.25 SENSITIVE Sensitive      GENTAMICIN >=16 RESISTANT Resistant     IMIPENEM <=0.25 SENSITIVE Sensitive     TRIMETH/SULFA >=320 RESISTANT Resistant     AMPICILLIN/SULBACTAM >=32 RESISTANT Resistant     PIP/TAZO <=4 SENSITIVE Sensitive     Extended ESBL Value in next row Sensitive      NEGATIVEPerformed at [facility] [address], [place], [place] [address]    *  ESCHERICHIA COLI  Blood Culture ID Panel (Reflexed)     Status: Abnormal   Collection Time: 08/31/18  9:44 AM  Result Value Ref Range Status   Enterococcus species NOT DETECTED NOT DETECTED Final   Listeria monocytogenes NOT DETECTED NOT DETECTED Final   Staphylococcus species NOT DETECTED NOT DETECTED Final   Staphylococcus aureus (BCID) NOT DETECTED NOT DETECTED Final   Streptococcus species NOT DETECTED NOT DETECTED Final   Streptococcus agalactiae NOT DETECTED NOT DETECTED Final   Streptococcus pneumoniae NOT DETECTED NOT DETECTED Final   Streptococcus pyogenes NOT DETECTED NOT DETECTED Final   Acinetobacter baumannii NOT DETECTED NOT DETECTED Final   Enterobacteriaceae species DETECTED (A) NOT DETECTED Final    Comment: Enterobacteriaceae represent a large family of gram-negative bacteria, not a single organism. CRITICAL RESULT CALLED TO, READ BACK BY AND VERIFIED WITH: J.GRIMSLEY,PHARMD [id number] 09/01/18 M.CAMPBELL    Enterobacter cloacae complex NOT DETECTED NOT DETECTED Final   Escherichia coli DETECTED (A) NOT DETECTED Final    Comment: CRITICAL RESULT CALLED TO, READ BACK BY AND VERIFIED WITH: J.GRIMSLEY,PHARMD [id number] 09/01/18 M.CAMPBELL    Klebsiella oxytoca NOT DETECTED NOT DETECTED Final   Klebsiella pneumoniae NOT DETECTED NOT DETECTED Final   Proteus species NOT DETECTED NOT DETECTED Final   Serratia marcescens NOT DETECTED NOT DETECTED Final   Carbapenem resistance NOT DETECTED NOT DETECTED Final   Haemophilus influenzae NOT DETECTED NOT DETECTED Final   Neisseria meningitidis NOT DETECTED NOT DETECTED Final   Pseudomonas  aeruginosa NOT DETECTED NOT DETECTED Final   Candida albicans NOT DETECTED NOT DETECTED Final   Candida glabrata NOT DETECTED NOT DETECTED Final   Candida krusei NOT DETECTED NOT DETECTED Final   Candida parapsilosis NOT DETECTED NOT DETECTED Final   Candida tropicalis NOT DETECTED NOT DETECTED Final    Comment: Performed at [facility], [place] [address] [place], [place] [address]  Culture, blood (Routine x 2)     Status: Abnormal (Preliminary result)   Collection Time: 08/31/18  9:55 AM  Result Value Ref Range Status   Specimen Description   Final    BLOOD LEFT ARM Performed at [facility] [address], [place], [place] [address]    Special Requests   Final    BOTTLES DRAWN AEROBIC AND ANAEROBIC Blood Culture adequate volume Performed at [facility] [address], [place], [place] [address]    Culture  Setup Time   Final    GRAM NEGATIVE RODS IN BOTH AEROBIC AND ANAEROBIC BOTTLES CRITICAL VALUE NOTED.  VALUE IS CONSISTENT WITH PREVIOUSLY REPORTED AND CALLED VALUE. Performed at [facility], [place] [address], [place], [place] [address]    Culture ESCHERICHIA COLI (A)  Final   Report Status PENDING  Incomplete     Labs: BNP (last 3 results) No results for input(s): BNP in the last 8760 hours. Basic Metabolic Panel: Recent Labs  Lab 08/31/18 0955 09/01/18 0439 09/02/18 0449 09/03/18 0456  NA 141 136 140 140  K 3.7 4.0 4.2 4.0  CL 101 103 108 107  CO2 27 25 24 24   GLUCOSE 183* 179* 208* 199*  BUN 30* 32* 25* 19  CREATININE 1.94* 2.15* 1.75* 1.45*  CALCIUM 9.1 8.0* 8.4* 8.7*   Liver Function Tests: Recent Labs  Lab 08/31/18 0955  AST 17  ALT 19  ALKPHOS 77  BILITOT 0.7  PROT 7.3  ALBUMIN 3.8   No results for input(s): LIPASE, AMYLASE in the last 168 hours. No results  for input(s): AMMONIA in the last 168 hours. CBC: Recent Labs  Lab 08/31/18 0955 09/01/18 0439 09/02/18 0449 09/03/18 0456  WBC 16.1*  13.2* 7.3 8.2  NEUTROABS 14.2*  --   --   --   HGB 12.0 9.7* 9.4* 9.4*  HCT 38.9 32.0* 30.7* 30.2*  MCV 98.2 99.7 96.2 95.6  PLT 215 197 213 234   Cardiac Enzymes: No results for input(s): CKTOTAL, CKMB, CKMBINDEX, TROPONINI in the last 168 hours. BNP: Invalid input(s): POCBNP CBG: Recent Labs  Lab 09/02/18 0732 09/02/18 1239 09/02/18 1806 09/02/18 2147 09/03/18 0732  GLUCAP 156* 228* 272* 238* 153*   D-Dimer No results for input(s): DDIMER in the last 72 hours. Hgb A1c No results for input(s): HGBA1C in the last 72 hours. Lipid Profile No results for input(s): CHOL, HDL, LDLCALC, TRIG, CHOLHDL, LDLDIRECT in the last 72 hours. Thyroid function studies No results for input(s): TSH, T4TOTAL, T3FREE, THYROIDAB in the last 72 hours.  Invalid input(s): FREET3 Anemia work up No results for input(s): VITAMINB12, FOLATE, FERRITIN, TIBC, IRON, RETICCTPCT in the last 72 hours. Urinalysis    Component Value Date/Time   COLORURINE YELLOW 08/31/2018 0932   APPEARANCEUR CLEAR 08/31/2018 0932   LABSPEC 1.013 08/31/2018 0932   PHURINE 5.0 08/31/2018 0932   GLUCOSEU NEGATIVE 08/31/2018 0932   HGBUR MODERATE (A) 08/31/2018 0932   BILIRUBINUR NEGATIVE 08/31/2018 0932   KETONESUR NEGATIVE 08/31/2018 0932   PROTEINUR 30 (A) 08/31/2018 0932   NITRITE NEGATIVE 08/31/2018 0932   LEUKOCYTESUR LARGE (A) 08/31/2018 0932   Sepsis Labs Invalid input(s): PROCALCITONIN,  WBC,  LACTICIDVEN Microbiology Recent Results (from the past 240 hour(s))  Culture, blood (Routine x 2)     Status: Abnormal (Preliminary result)   Collection Time: 08/31/18  9:44 AM  Result Value Ref Range Status   Specimen Description LEFT ANTECUBITAL  Final   Special Requests   Final    BOTTLES DRAWN AEROBIC AND ANAEROBIC Blood Culture adequate volume   Culture  Setup Time   Final    GRAM NEGATIVE RODS AEROBIC BOTTLE ONLY CRITICAL RESULT CALLED TO, READ BACK BY AND VERIFIED WITH: J.GRIMSLEY,PHARMD [id number] 09/01/18  M.CAMPBELL    Culture ESCHERICHIA COLI (A)  Final   Report Status PENDING  Incomplete   Organism ID, Bacteria ESCHERICHIA COLI  Final      Susceptibility   Escherichia coli - MIC*    AMPICILLIN >=32 RESISTANT Resistant     CEFAZOLIN <=4 SENSITIVE Sensitive     CEFEPIME <=1 SENSITIVE Sensitive     CEFTAZIDIME <=1 SENSITIVE Sensitive     CEFTRIAXONE <=1 SENSITIVE Sensitive     CIPROFLOXACIN <=0.25 SENSITIVE Sensitive     GENTAMICIN >=16 RESISTANT Resistant     IMIPENEM <=0.25 SENSITIVE Sensitive     TRIMETH/SULFA >=320 RESISTANT Resistant     AMPICILLIN/SULBACTAM >=32 RESISTANT Resistant     PIP/TAZO <=4 SENSITIVE Sensitive     Extended ESBL Value in next row Sensitive      NEGATIVEPerformed at [facility] [address] [place], [place] [address]    * ESCHERICHIA COLI  Blood Culture ID Panel (Reflexed)     Status: Abnormal   Collection Time: 08/31/18  9:44 AM  Result Value Ref Range Status   Enterococcus species NOT DETECTED NOT DETECTED Final   Listeria monocytogenes NOT DETECTED NOT DETECTED Final   Staphylococcus species NOT DETECTED NOT DETECTED Final   Staphylococcus aureus (BCID) NOT DETECTED NOT DETECTED Final   Streptococcus species NOT DETECTED NOT DETECTED  Final   Streptococcus agalactiae NOT DETECTED NOT DETECTED Final   Streptococcus pneumoniae NOT DETECTED NOT DETECTED Final   Streptococcus pyogenes NOT DETECTED NOT DETECTED Final   Acinetobacter baumannii NOT DETECTED NOT DETECTED Final   Enterobacteriaceae species DETECTED (A) NOT DETECTED Final    Comment: Enterobacteriaceae represent a large family of gram-negative bacteria, not a single organism. CRITICAL RESULT CALLED TO, READ BACK BY AND VERIFIED WITH: J.GRIMSLEY,PHARMD [id number] 09/01/18 M.CAMPBELL    Enterobacter cloacae complex NOT DETECTED NOT DETECTED Final   Escherichia coli DETECTED (A) NOT DETECTED Final    Comment: CRITICAL RESULT CALLED TO, READ BACK BY AND VERIFIED WITH: J.GRIMSLEY,PHARMD  [id number] 09/01/18 M.CAMPBELL    Klebsiella oxytoca NOT DETECTED NOT DETECTED Final   Klebsiella pneumoniae NOT DETECTED NOT DETECTED Final   Proteus species NOT DETECTED NOT DETECTED Final   Serratia marcescens NOT DETECTED NOT DETECTED Final   Carbapenem resistance NOT DETECTED NOT DETECTED Final   Haemophilus influenzae NOT DETECTED NOT DETECTED Final   Neisseria meningitidis NOT DETECTED NOT DETECTED Final   Pseudomonas aeruginosa NOT DETECTED NOT DETECTED Final   Candida albicans NOT DETECTED NOT DETECTED Final   Candida glabrata NOT DETECTED NOT DETECTED Final   Candida krusei NOT DETECTED NOT DETECTED Final   Candida parapsilosis NOT DETECTED NOT DETECTED Final   Candida tropicalis NOT DETECTED NOT DETECTED Final    Comment: Performed at [facility], [place] [address], [place], [place] [address]  Culture, blood (Routine x 2)     Status: Abnormal (Preliminary result)   Collection Time: 08/31/18  9:55 AM  Result Value Ref Range Status   Specimen Description   Final    BLOOD LEFT ARM Performed at [facility] [address] [place], [place] [address]    Special Requests   Final    BOTTLES DRAWN AEROBIC AND ANAEROBIC Blood Culture adequate volume Performed at [facility] [address], [place], [place] [address]    Culture  Setup Time   Final    GRAM NEGATIVE RODS IN BOTH AEROBIC AND ANAEROBIC BOTTLES CRITICAL VALUE NOTED.  VALUE IS CONSISTENT WITH PREVIOUSLY REPORTED AND CALLED VALUE. Performed at [facility], [place] [address] [place], [place] [address]    Culture ESCHERICHIA COLI (A)  Final   Report Status PENDING  Incomplete     Time coordinating discharge: 40 minutes        SIGNED:   Edwin Dada, MD  [facility] 09/03/2018, 10:48 AM

## 2018-09-05 LAB — CULTURE, BLOOD (ROUTINE X 2)
SPECIAL REQUESTS: ADEQUATE
Special Requests: ADEQUATE

## 2018-09-07 ENCOUNTER — Ambulatory Visit (HOSPITAL_COMMUNITY): Payer: Medicare Other | Admitting: Psychiatry

## 2018-09-07 IMAGING — CT CT ABD-PELV W/ CM
2 of 5 series · 16 of 46 positions shown, 18 images · IV contrast (ISOVUE)
Comparison: 12/24/2015 and previous

CLINICAL DATA: fever and chills. Patient vomited 3x since this
morning. Hx. Of chronic back pain. No chest pain or dizziness. Temp-
101.7. 100 mg tylenol given per EMS in rout. Patient vomitted 1x

EXAM:
CT ABDOMEN AND PELVIS WITH CONTRAST
TECHNIQUE: Multidetector CT imaging of the abdomen and pelvis was performed
using the standard protocol following bolus administration of
intravenous contrast.
CONTRAST:  75mL [id number]-[id number] IOPAMIDOL (7AZ08W-[id number]) INJECTION 61%

[Series 2: axial st · axial · 0.86mm/px · z∈[-442,-62]mm · 13 of 90 slices shown, 15 images]
[im 7/90  soft-tissue]
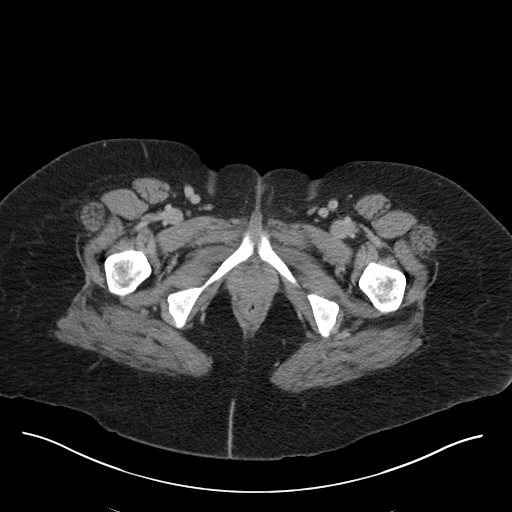
[im 7/90  bone]
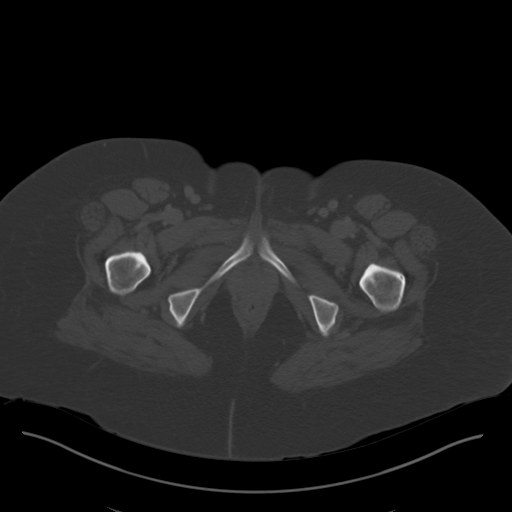
[im 13/90  soft-tissue]
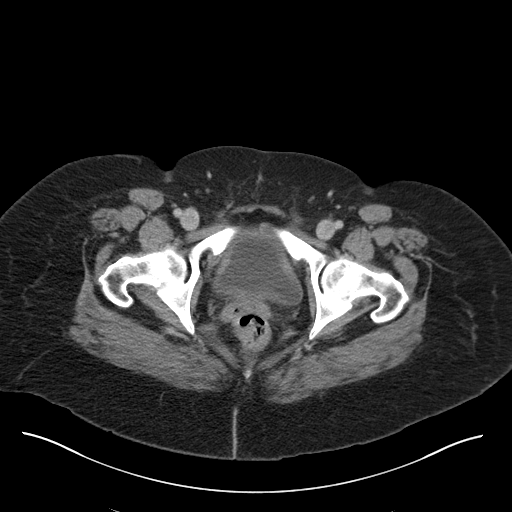
[im 20/90  soft-tissue]
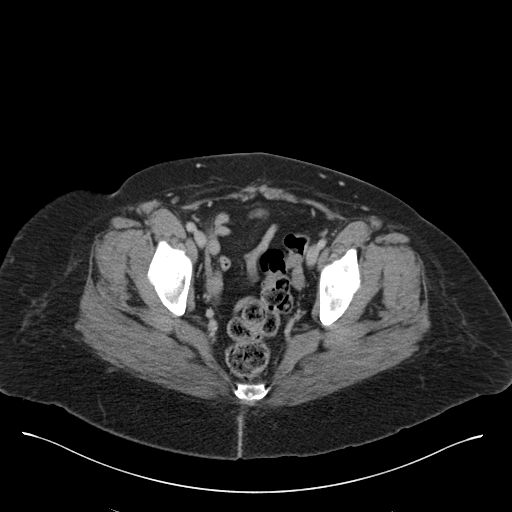
[im 26/90  soft-tissue]
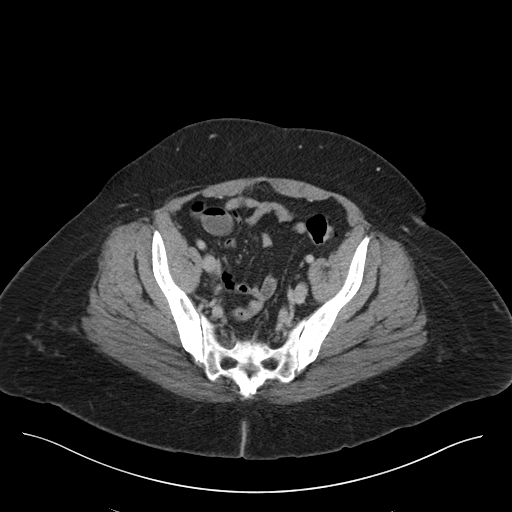
[im 32/90  soft-tissue]
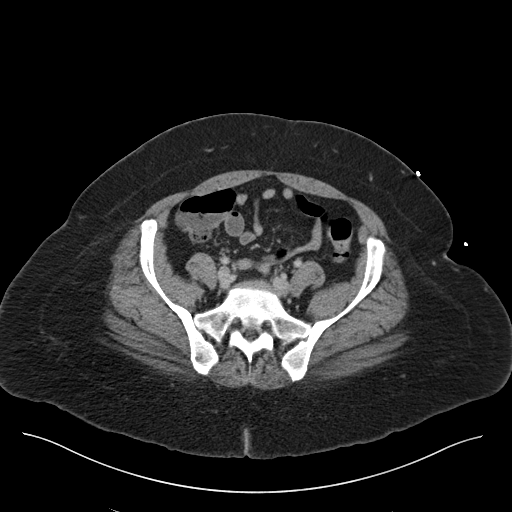
[im 39/90  soft-tissue]
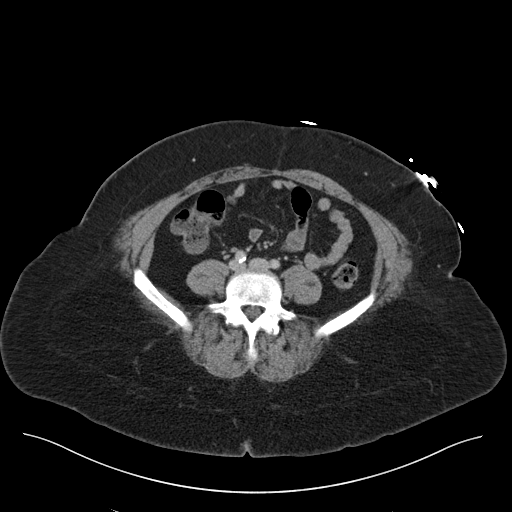
[im 45/90  soft-tissue]
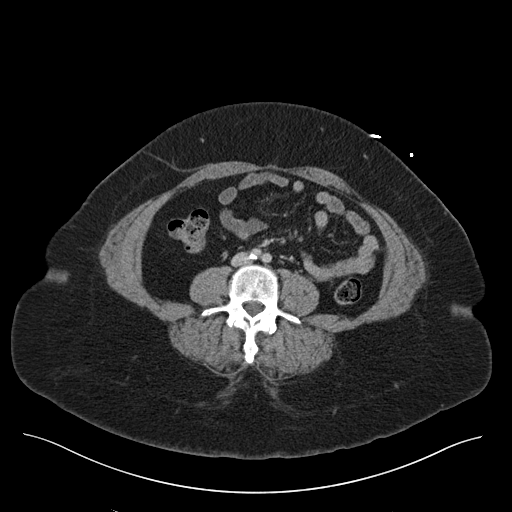
[im 51/90  soft-tissue]
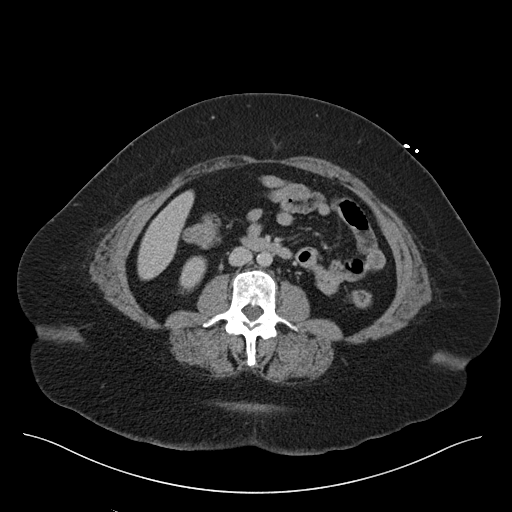
[im 58/90  soft-tissue]
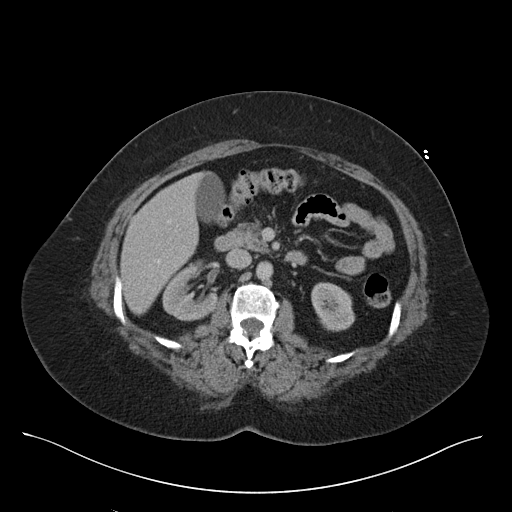
[im 58/90  bone]
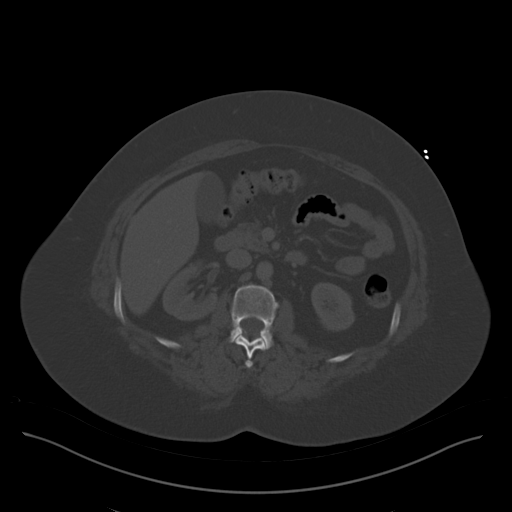
[im 64/90  soft-tissue]
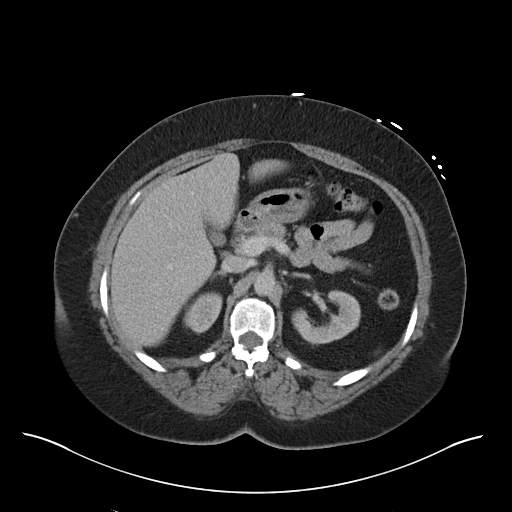
[im 70/90  soft-tissue]
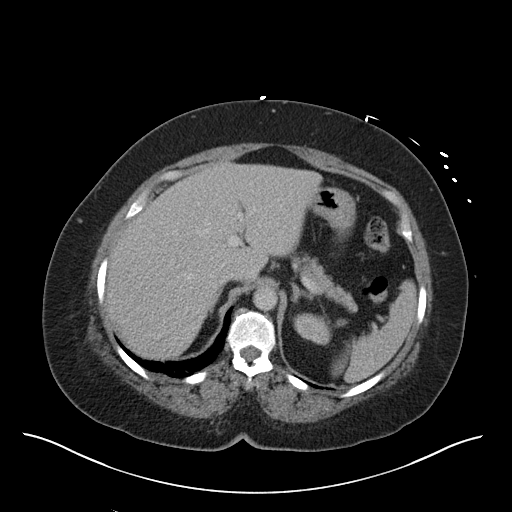
[im 77/90  soft-tissue]
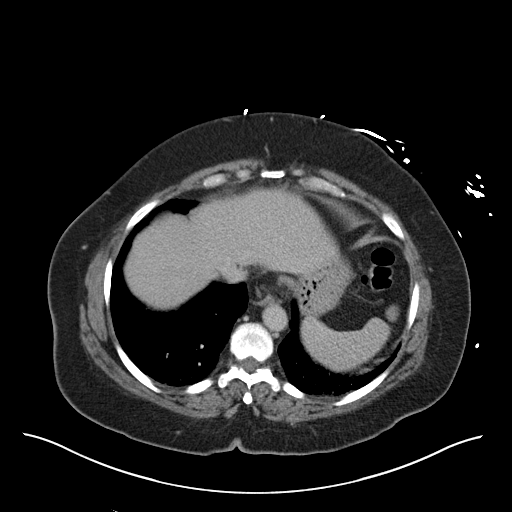
[im 83/90  soft-tissue]
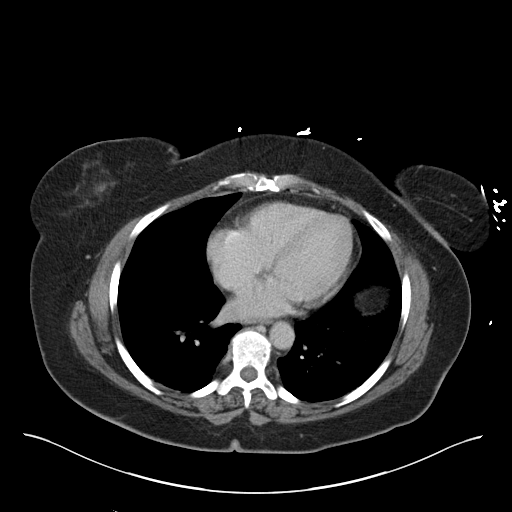

[Series 5: coronal st · coronal · 0.74mm/px · 3 of 93 slices shown]
[im 31/93  soft-tissue]
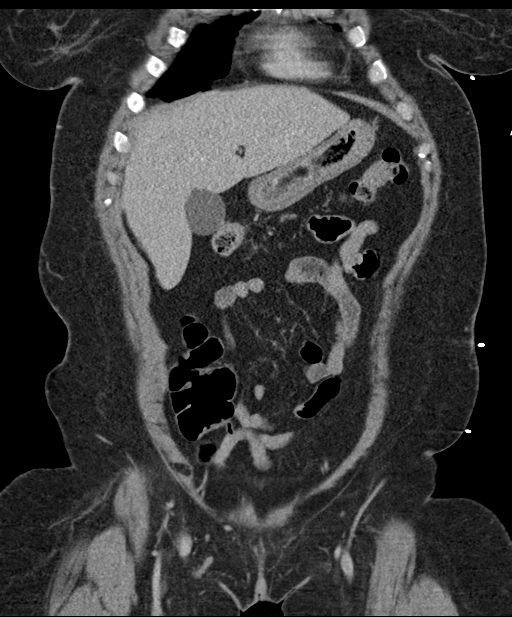
[im 41/93  soft-tissue]
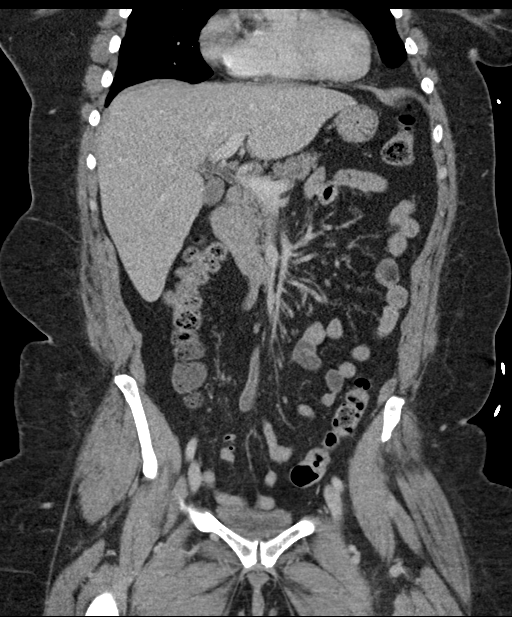
[im 52/93  soft-tissue]
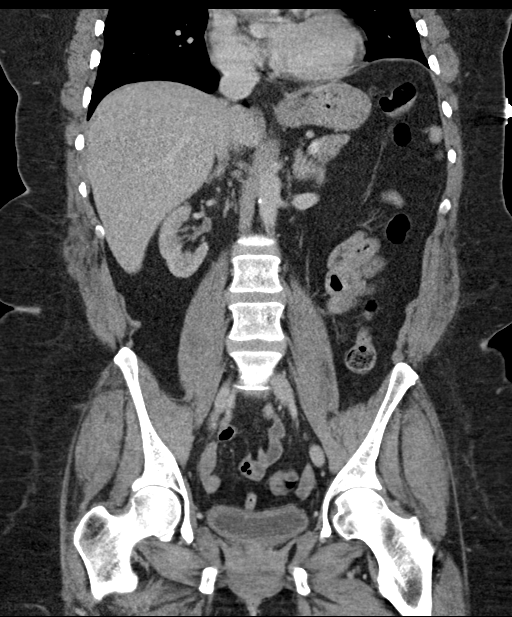

[16 of 46 positions shown; findings below may reference images not displayed]

FINDINGS: Lower chest: No acute abnormality.

Hepatobiliary: No focal liver abnormality is seen. No gallstones,
gallbladder wall thickening, or biliary dilatation.

Pancreas: Unremarkable. No pancreatic ductal dilatation or
surrounding inflammatory changes.

Spleen: Normal in size without focal abnormality.

Adrenals/Urinary Tract: Adrenal glands are unremarkable. Kidneys are
normal, without renal calculi, focal lesion, or hydronephrosis.
Bladder is unremarkable.

Stomach/Bowel: Stomach is within normal limits. Appendix appears
normal. No evidence of bowel wall thickening, distention, or
inflammatory changes.

Vascular/Lymphatic: No abdominal or pelvic adenopathy. Scattered
calcified plaque in the aorta and common iliac arteries without
aneurysm or stenosis. Portal vein patent.

Reproductive: Status post hysterectomy. No adnexal masses.

Other: No ascites.  No free air.

Musculoskeletal: No acute or significant osseous findings.
IMPRESSION: 1. No acute findings.
2.  Aortic Atherosclerosis ([id number])

## 2018-09-21 ENCOUNTER — Inpatient Hospital Stay: Admission: RE | Admit: 2018-09-21 | Payer: Self-pay | Source: Ambulatory Visit

## 2018-09-23 NOTE — Progress Notes (Addendum)
[facility]  PATIENT: [name] DOB: 02/24/1956   REASON FOR VISIT: Follow-up for obstructive sleep apnea with Autopap compliance HISTORY FROM: Patient    HISTORY OF PRESENT ILLNESS: Ms. Onofre is a 62 year old right-handed woman with an underlying medical history of hypertension, hyperlipidemia, hyperthyroidism, reflux disease, diabetes, depression, anxiety, anemia, A. fib, chronic kidney disease, congestive heart failure, asthma, and obesity, who presents for follow-up consultation of her obstructive sleep apnea, recent sleep study testing and starting AutoPap therapy. The patient is unaccompanied today. I first met her on 08/07/2017 at the request of her primary care provider, at which time she reported snoring and daytime somnolence as well as waking up at night with shortness of breath. She was invited for sleep study. She had a baseline sleep study on 10/09/2017. I went over her test results with her in detail today. Sleep efficiency was 61.6%, sleep latency 30.5 minutes and REM sleep was absent. She had an increased percentage of stage II sleep, slow-wave sleep was 18.1%, REM sleep was absent. She had mild snoring. Total AHI was 5.8 per hour, she did not achieve supine sleep. Average oxygen saturation was 98%, nadir was 87%, she did not have any significant PLMS. Based on her medical history and sleep related complaints I suggested home treatment with AutoPap therapy. She was also advised that absence of supine sleep and absence of REM sleep during the sleep study with likely underestimated her sleep disordered breathing.  Today, 01/22/2018: I reviewed her AutoPap compliance data from 12/22/2017 through 01/20/2018 which is a total of 30 days, during which time she used her AutoPap only 3 days, indicating noncompliance. In the past 57 days from 11/25/2017 through 01/20/2018 she used her AutoPap about 8 days, again indicating noncompliance. She reports that she has  had difficulty using her AutoPap she was not sure how to use it even though she was told how to use it by her DME company. She would be willing to make another appointment for reeducation and relearning.  UPDATE 12/3/2019CM Ms. Jimmye Norman, 62 year old female returns for follow-up for CPAP compliance.  Compliance data for 90 days 06/26/2018-to 09/23/2018 shows compliance greater than 4 hours at 2% indicating noncompliance she reports that she has had difficulty using her AutoPap there is water in the tubing.  She also reports that she had a recent hospitalization for pyelonephritis.  She has not followed up with the equipment company for reeducation and relearning  and she was encouraged to do so. REVIEW OF SYSTEMS: Full 14 system review of systems performed and notable only for those listed, all others are neg:  Constitutional: neg  Cardiovascular: neg Ear/Nose/Throat: neg  Skin: neg Eyes: neg Respiratory: neg Gastroitestinal: neg  Hematology/Lymphatic: neg  Endocrine: neg Musculoskeletal:neg Allergy/Immunology: neg Neurological: neg Psychiatric: neg Sleep : Obstructive sleep apnea noncompliance   ALLERGIES: Allergies  Allergen Reactions  . Contrast Media [Iodinated Diagnostic Agents]   . Doxycycline   . Albuterol Sulfate Rash  . Codeine Other (See Comments)    Breaks out in a rash  . Erythromycin Base Diarrhea and Rash    HOME MEDICATIONS: Outpatient Medications Prior to Visit  Medication Sig Dispense Refill  . acetaminophen (TYLENOL) 500 MG tablet Take 1,000 mg by mouth daily as needed for mild pain.    . Adalimumab 40 MG/0.8ML PNKT Inject into the skin.    Marland Kitchen ammonium lactate (LAC-HYDRIN) 12 % lotion Apply 1 application topically daily as needed for dry skin.    Marland Kitchen atropine 1 % ophthalmic  solution Place 1 drop into both eyes daily.    . benzonatate (TESSALON) 100 MG capsule Take 1 capsule (100 mg total) by mouth every 8 (eight) hours. 21 capsule 0  . budesonide-formoterol  (SYMBICORT) 160-4.5 MCG/ACT inhaler Inhale 2 puffs into the lungs 2 (two) times daily.    Marland Kitchen buPROPion (WELLBUTRIN XL) 150 MG 24 hr tablet Take 150 mg by mouth daily.    . busPIRone (BUSPAR) 10 MG tablet Take 10 mg by mouth 2 (two) times daily.    . cyclobenzaprine (FLEXERIL) 10 MG tablet Take 10 mg by mouth 3 (three) times daily as needed for muscle spasms.  0  . DUREZOL 0.05 % EMUL Place 1 drop into both eyes 4 (four) times daily.    . ferrous gluconate (FERGON) 324 MG tablet Take 324 mg by mouth 2 (two) times daily.     . folic acid (FOLVITE) 1 MG tablet Take 1 mg by mouth daily.    . furosemide (LASIX) 40 MG tablet Take 40 mg by mouth daily.    Marland Kitchen levothyroxine (SYNTHROID, LEVOTHROID) 50 MCG tablet Take 50 mcg by mouth daily before breakfast.    . lisinopril-hydrochlorothiazide (PRINZIDE,ZESTORETIC) 20-12.5 MG per tablet Take 1 tablet by mouth daily.    Marland Kitchen omeprazole (PRILOSEC) 20 MG capsule Take 20 mg by mouth daily.    . potassium chloride (K-DUR,KLOR-CON) 10 MEQ tablet Take 10 mEq by mouth daily.    . pravastatin (PRAVACHOL) 40 MG tablet Take 40 mg by mouth daily.    . predniSONE (DELTASONE) 10 MG tablet Take 10 mg by mouth daily.    . rivaroxaban (XARELTO) 20 MG TABS tablet Take 20 mg by mouth every evening.     Marland Kitchen azaTHIOprine (IMURAN) 50 MG tablet Take 25 mg by mouth daily.    . methotrexate 2.5 MG tablet Take 15 mg by mouth once a week.     No facility-administered medications prior to visit.     PAST MEDICAL HISTORY: Past Medical History:  Diagnosis Date  . Anemia   . Atrial fibrillation ([place])   . Bilateral breast cysts   . CHF (congestive heart failure) ([place])   . Chronic kidney disease    stage 3  . Depression with anxiety   . DM (diabetes mellitus) ([place])   . Esophageal reflux   . HTN (hypertension)   . Hyperlipemia   . Hyperthyroidism   . INR (international normal ratio) abnormal   . MRSA (methicillin resistant staph aureus) culture positive   . Pneumonia   . PTSD  (post-traumatic stress disorder)     PAST SURGICAL HISTORY: Past Surgical History:  Procedure Laterality Date  . ABDOMINAL HYSTERECTOMY    . BREAST BIOPSY    . CATARACT EXTRACTION  2018  . CESAREAN SECTION    . DENTAL SURGERY    . EYE SURGERY    . FOOT SURGERY    . HERNIA REPAIR    . KNEE SURGERY    . TONSILLECTOMY AND ADENOIDECTOMY      FAMILY HISTORY: Family History  Problem Relation Age of Onset  . Alcoholism Father   . Arthritis Father   . Hypertension Father        sister, mother  . Diabetes Mellitus I Mother        sister  . Renal Disease Mother   . Stroke Mother   . Epilepsy Brother     SOCIAL HISTORY: Social History   Socioeconomic History  . Marital status: Legally Separated  Spouse name: Not on file  . Number of children: Not on file  . Years of education: Not on file  . Highest education level: Not on file  Occupational History  . Not on file  Social Needs  . Financial resource strain: Not on file  . Food insecurity:    Worry: Not on file    Inability: Not on file  . Transportation needs:    Medical: Not on file    Non-medical: Not on file  Tobacco Use  . Smoking status: Former Smoker    Last attempt to quit: 12/15/2003    Years since quitting: 14.7  . Smokeless tobacco: Current User    Types: Snuff  . Tobacco comment: 3 times per day  Substance and Sexual Activity  . Alcohol use: Not on file  . Drug use: Not on file  . Sexual activity: Not on file  Lifestyle  . Physical activity:    Days per week: Not on file    Minutes per session: Not on file  . Stress: Not on file  Relationships  . Social connections:    Talks on phone: Not on file    Gets together: Not on file    Attends religious service: Not on file    Active member of club or organization: Not on file    Attends meetings of clubs or organizations: Not on file    Relationship status: Not on file  . Intimate partner violence:    Fear of current or ex partner: Not on file     Emotionally abused: Not on file    Physically abused: Not on file    Forced sexual activity: Not on file  Other Topics Concern  . Not on file  Social History Narrative  . Not on file     PHYSICAL EXAM  Vitals:   09/24/18 1521  BP: 140/90  Pulse: (!) 102  Weight: 226 lb 3.2 oz (102.6 kg)  Height: 5' 1"  (1.549 m)   Body mass index is 42.74 kg/m.  Generalized: Well developed, obese female in no acute distress , well-groomed Head: normocephalic and atraumatic,. Oropharynx benign mallopatti 2 , edentulous Neck: Supple,  Cardiac: Regular rate rhythm, no murmur  Musculoskeletal: No deformity   Neurological examination   Mentation: Alert oriented to time, place, history taking. Attention span and concentration appropriate. Recent and remote memory intact.  Follows all commands speech and language fluent.   Cranial nerve II-XII: Pupils were equal round reactive to light extraocular movements were full, visual field were full on confrontational test. Facial sensation and strength were normal. hearing was intact to finger rubbing bilaterally. Uvula tongue midline. head turning and shoulder shrug were normal and symmetric.Tongue protrusion into cheek strength was normal. Motor: normal bulk and tone, full strength in the BUE, BLE,  Sensory: normal and symmetric to light touch,  Coordination: finger-nose-finger, heel-to-shin bilaterally, no dysmetria, no tremor Gait and Station: Rising up from seated position without assistance, normal stance,  moderate stride, good arm swing, smooth turning, able to perform tiptoe, and heel walking without difficulty. Tandem gait is steady.  DIAGNOSTIC DATA (LABS, IMAGING, TESTING) - I reviewed patient records, labs, notes, testing and imaging myself where available.  Lab Results  Component Value Date   WBC 8.2 09/03/2018   HGB 9.4 (L) 09/03/2018   HCT 30.2 (L) 09/03/2018   MCV 95.6 09/03/2018   PLT 234 09/03/2018      Component Value Date/Time     NA 140 09/03/2018 0456  NA 145 (H) 10/26/2017 1609   K 4.0 09/03/2018 0456   CL 107 09/03/2018 0456   CO2 24 09/03/2018 0456   GLUCOSE 199 (H) 09/03/2018 0456   BUN 19 09/03/2018 0456   BUN 19 10/26/2017 1609   CREATININE 1.45 (H) 09/03/2018 0456   CALCIUM 8.7 (L) 09/03/2018 0456   PROT 7.3 08/31/2018 0955   ALBUMIN 3.8 08/31/2018 0955   AST 17 08/31/2018 0955   ALT 19 08/31/2018 0955   ALKPHOS 77 08/31/2018 0955   BILITOT 0.7 08/31/2018 0955   GFRNONAA 38 (L) 09/03/2018 0456   GFRAA 44 (L) 09/03/2018 0456    ASSESSMENT AND PLAN   Sabree Williamsis a very pleasant 46 year oldfemalewith an underlying complex medical history of hypertension, hyperlipidemia, hyperthyroidism, reflux disease, diabetes, depression, anxiety, anemia, A. fib, chronic kidney disease, congestive heart failure, asthma, and obesity, who presents for follow-up consultation of her obstructive sleep apnea. She had a baseline sleep study on 10/09/2014 which showed overall borderline or mild obstructive sleep apnea, however, there was absence of supine sleep and REM sleep during the study. She was advised to try AutoPap therapy at home. She has not been able to be compliant with AutoPap as yet. She needs to make an appointment with her DME company for reeducation on how to use her AutoPap machine.  I explained to the patient the importance of being compliant with AutoPap therapy .  PLAN: AutoPAP compliance 2% for 90 days Patient claims that there is problem with leakage in the tubing Please make an appointment with [facility] [phone] to have machine checked, be reeducated on the machine F/U in 6 months Dennie Bible, [facility], [facility], [facility] [address], [place], [place] [address] [phone]  I reviewed the above note and documentation by the Nurse Practitioner and agree with the history, physical exam, assessment and plan as outlined above. I was immediately  available for face-to-face consultation. Star Age, MD, PhD [facility])

## 2018-09-24 ENCOUNTER — Ambulatory Visit (INDEPENDENT_AMBULATORY_CARE_PROVIDER_SITE_OTHER): Payer: Medicare Other | Admitting: Nurse Practitioner

## 2018-09-24 ENCOUNTER — Encounter: Payer: Self-pay | Admitting: Nurse Practitioner

## 2018-09-24 VITALS — BP 140/90 | HR 102 | Ht 61.0 in | Wt 226.2 lb

## 2018-09-24 DIAGNOSIS — G4733 Obstructive sleep apnea (adult) (pediatric): Secondary | ICD-10-CM | POA: Diagnosis not present

## 2018-09-24 DIAGNOSIS — Z9989 Dependence on other enabling machines and devices: Secondary | ICD-10-CM

## 2018-09-24 DIAGNOSIS — Z9114 Patient's other noncompliance with medication regimen: Secondary | ICD-10-CM | POA: Diagnosis not present

## 2018-09-24 NOTE — Patient Instructions (Addendum)
CPAP compliance 2% for 90 days Patient claims that there is problem with leakage in the tubing She also had recent hospital adm  Please make an appointment with [facility] [phone] to have machine checked F/U in 6 months

## 2018-09-26 ENCOUNTER — Inpatient Hospital Stay: Admission: RE | Admit: 2018-09-26 | Payer: Self-pay | Source: Ambulatory Visit

## 2018-10-02 ENCOUNTER — Ambulatory Visit: Payer: Self-pay

## 2018-10-29 ENCOUNTER — Ambulatory Visit
Admission: RE | Admit: 2018-10-29 | Discharge: 2018-10-29 | Disposition: A | Discharge status: Home / Self Care | Payer: Medicare Other | Source: Ambulatory Visit | Attending: Orthopedic Surgery | Admitting: Orthopedic Surgery

## 2018-10-29 DIAGNOSIS — R609 Edema, unspecified: Secondary | ICD-10-CM

## 2018-10-29 DIAGNOSIS — R52 Pain, unspecified: Secondary | ICD-10-CM

## 2018-10-29 DIAGNOSIS — M2560 Stiffness of unspecified joint, not elsewhere classified: Secondary | ICD-10-CM

## 2018-11-07 ENCOUNTER — Ambulatory Visit: Payer: Self-pay

## 2018-11-10 ENCOUNTER — Emergency Department (HOSPITAL_COMMUNITY)
Admission: EM | Admit: 2018-11-10 | Discharge: 2018-11-10 | Disposition: A | Discharge status: Home / Self Care | Payer: Medicare Other | Attending: Emergency Medicine | Admitting: Emergency Medicine

## 2018-11-10 ENCOUNTER — Encounter (HOSPITAL_COMMUNITY): Payer: Self-pay

## 2018-11-10 ENCOUNTER — Other Ambulatory Visit: Payer: Self-pay

## 2018-11-10 ENCOUNTER — Emergency Department (HOSPITAL_COMMUNITY): Payer: Medicare Other

## 2018-11-10 DIAGNOSIS — N183 Chronic kidney disease, stage 3 (moderate): Secondary | ICD-10-CM | POA: Diagnosis not present

## 2018-11-10 DIAGNOSIS — J209 Acute bronchitis, unspecified: Secondary | ICD-10-CM

## 2018-11-10 DIAGNOSIS — F17220 Nicotine dependence, chewing tobacco, uncomplicated: Secondary | ICD-10-CM | POA: Insufficient documentation

## 2018-11-10 DIAGNOSIS — I509 Heart failure, unspecified: Secondary | ICD-10-CM | POA: Insufficient documentation

## 2018-11-10 DIAGNOSIS — R0602 Shortness of breath: Secondary | ICD-10-CM | POA: Diagnosis not present

## 2018-11-10 DIAGNOSIS — E1122 Type 2 diabetes mellitus with diabetic chronic kidney disease: Secondary | ICD-10-CM | POA: Insufficient documentation

## 2018-11-10 DIAGNOSIS — Z7901 Long term (current) use of anticoagulants: Secondary | ICD-10-CM | POA: Insufficient documentation

## 2018-11-10 DIAGNOSIS — E039 Hypothyroidism, unspecified: Secondary | ICD-10-CM | POA: Diagnosis not present

## 2018-11-10 DIAGNOSIS — R05 Cough: Secondary | ICD-10-CM | POA: Diagnosis present

## 2018-11-10 DIAGNOSIS — I130 Hypertensive heart and chronic kidney disease with heart failure and stage 1 through stage 4 chronic kidney disease, or unspecified chronic kidney disease: Secondary | ICD-10-CM | POA: Diagnosis not present

## 2018-11-10 DIAGNOSIS — Z79899 Other long term (current) drug therapy: Secondary | ICD-10-CM | POA: Insufficient documentation

## 2018-11-10 LAB — BASIC METABOLIC PANEL
ANION GAP: 13 (ref 5–15)
BUN: 27 mg/dL — ABNORMAL HIGH (ref 8–23)
CHLORIDE: 91 mmol/L — AB (ref 98–111)
CO2: 25 mmol/L (ref 22–32)
Calcium: 8.9 mg/dL (ref 8.9–10.3)
Creatinine, Ser: 1.91 mg/dL — ABNORMAL HIGH (ref 0.44–1.00)
GFR calc non Af Amer: 28 mL/min — ABNORMAL LOW (ref >60)
GFR, EST AFRICAN AMERICAN: 32 mL/min — AB (ref >60)
GLUCOSE: 687 mg/dL — AB (ref 70–99)
Potassium: 4 mmol/L (ref 3.5–5.1)
Sodium: 129 mmol/L — ABNORMAL LOW (ref 135–145)

## 2018-11-10 LAB — CBG MONITORING, ED
Glucose-Capillary: >600 mg/dL (ref 70–99)
Glucose-Capillary: 287 mg/dL — ABNORMAL HIGH (ref 70–99)

## 2018-11-10 LAB — CBC
HEMATOCRIT: 41.1 % (ref 36.0–46.0)
HEMOGLOBIN: 13.4 g/dL (ref 12.0–15.0)
MCH: 29.3 pg (ref 26.0–34.0)
MCHC: 32.6 g/dL (ref 30.0–36.0)
MCV: 89.7 fL (ref 80.0–100.0)
NRBC: 0 % (ref 0.0–0.2)
Platelets: 231 10*3/uL (ref 150–400)
RBC: 4.58 MIL/uL (ref 3.87–5.11)
RDW: 13.4 % (ref 11.5–15.5)
WBC: 9.7 10*3/uL (ref 4.0–10.5)

## 2018-11-10 LAB — URINALYSIS, ROUTINE W REFLEX MICROSCOPIC
Bacteria, UA: NONE SEEN
Bilirubin Urine: NEGATIVE
HGB URINE DIPSTICK: NEGATIVE
KETONES UR: 5 mg/dL — AB
LEUKOCYTES UA: NEGATIVE
NITRITE: NEGATIVE
PH: 6 (ref 5.0–8.0)
Protein, ur: NEGATIVE mg/dL
Specific Gravity, Urine: 1.026 (ref 1.005–1.030)

## 2018-11-10 LAB — I-STAT TROPONIN, ED: TROPONIN I, POC: 0 ng/mL (ref 0.00–0.08)

## 2018-11-10 LAB — BRAIN NATRIURETIC PEPTIDE: B Natriuretic Peptide: 14.9 pg/mL (ref 0.0–100.0)

## 2018-11-10 MED ORDER — SODIUM CHLORIDE 0.9 % IV BOLUS
1000.0000 mL | Freq: Once | INTRAVENOUS | Status: AC
  Administered 2018-11-10: 1000 mL via INTRAVENOUS

## 2018-11-10 MED ORDER — HYDROCOD POLST-CPM POLST ER 10-8 MG/5ML PO SUER: 5.0000 mL | Freq: Two times a day (BID) | ORAL | 0 refills | Status: DC | PRN

## 2018-11-10 MED ORDER — MORPHINE SULFATE (PF) 4 MG/ML IV SOLN
4.0000 mg | Freq: Once | INTRAVENOUS | Status: AC
  Administered 2018-11-10: 4 mg via INTRAVENOUS
  Filled 2018-11-10: qty 1

## 2018-11-10 MED ORDER — LEVOFLOXACIN 750 MG PO TABS: 750.0000 mg | ORAL_TABLET | Freq: Every day | ORAL | 0 refills | Status: DC

## 2018-11-10 MED ORDER — IPRATROPIUM-ALBUTEROL 0.5-2.5 (3) MG/3ML IN SOLN: 3.0000 mL | Freq: Once | RESPIRATORY_TRACT | Status: DC

## 2018-11-10 MED ORDER — INSULIN ASPART 100 UNIT/ML ~~LOC~~ SOLN
10.0000 [IU] | Freq: Once | SUBCUTANEOUS | Status: AC
  Administered 2018-11-10: 10 [IU] via INTRAVENOUS
  Filled 2018-11-10: qty 1

## 2018-11-10 NOTE — ED Notes (Signed)
Patient transported to X-ray 

## 2018-11-10 NOTE — Discharge Instructions (Addendum)
Levaquin as prescribed.  Tussionex as prescribed as needed for cough.  Follow-up with your primary doctor if symptoms or not improving in the next week.

## 2018-11-10 NOTE — ED Triage Notes (Signed)
Per EMS: Pt c/o of URI infection x1 week.  Pt seen in UC, pt prescribed amoxicillin and prednisone (01/08).  Pt only took ABX for 7 days and stopped due to c/o of burning diarrhea. Pt c/o of gradually feeling worse for the past 3 days.  Pt c/o of SOB, back pain, fever, and generalized body aches.  Pt given Atrovent for exp wheezing.  Pt states she is allergic to albuterol.

## 2018-11-10 NOTE — ED Notes (Signed)
Bed: WA20 Expected date:  Expected time:  Means of arrival:  Comments: URI

## 2018-11-10 NOTE — ED Provider Notes (Signed)
[place] DEPT Provider Note   CSN: [id number] Arrival date & time: 11/10/18  4128     History   Chief Complaint Chief Complaint  Patient presents with  . Shortness of Breath  . Back Pain  . Fever  . Generalized Body Aches    HPI Jennifer Foley is a 63 y.o. female.  Patient is a 63 year old female with past medical history of atrial fibrillation, CHF, diabetes, hypertension.  She presents today for evaluation of cough and upper back pain.  This began approximately 2 weeks ago.  She was seen at her primary doctor's office and was prescribed amoxicillin and Tessalon, however this has not helped.  She quit taking the amoxicillin due to loose stools.  Her cough has been productive at times.  She denies abdominal pain.  She does report some shortness of breath.  The history is provided by the patient.  Shortness of Breath  Severity:  Moderate Onset quality:  Gradual Duration:  2 weeks Timing:  Constant Progression:  Unchanged Chronicity:  New Context: activity and URI   Relieved by:  Nothing Worsened by:  Nothing Ineffective treatments:  None tried Associated symptoms: fever   Back Pain  Associated symptoms: fever   Fever    Past Medical History:  Diagnosis Date  . Anemia   . Atrial fibrillation ([place])   . Bilateral breast cysts   . CHF (congestive heart failure) ([place])   . Chronic kidney disease    stage 3  . Depression with anxiety   . DM (diabetes mellitus) ([place])   . Esophageal reflux   . HTN (hypertension)   . Hyperlipemia   . Hyperthyroidism   . INR (international normal ratio) abnormal   . MRSA (methicillin resistant staph aureus) culture positive   . Pneumonia   . PTSD (post-traumatic stress disorder)     Patient Active Problem List   Diagnosis Date Noted  . Noncompliance with CPAP treatment 09/24/2018  . A-fib ([place]) 08/31/2018  . CHF (congestive heart failure) ([place]) 08/31/2018  . Acute pyelonephritis 08/31/2018  .  Sepsis secondary to UTI ([place]) 08/31/2018  . Leucocytosis 08/31/2018  . Hypothyroidism 08/31/2018  . CKD (chronic kidney disease) stage 3, GFR 30-59 ml/min ([facility]) 08/31/2018  . HTN (hypertension) 08/31/2018  . HLD (hyperlipidemia) 08/31/2018  . Anxiety and depression 08/31/2018  . Obesity 08/31/2018  . Sepsis ([place]) 08/31/2018  . Back pain 08/31/2018  . Obstructive sleep apnea treated with continuous positive airway pressure (CPAP) 08/13/2018    Past Surgical History:  Procedure Laterality Date  . ABDOMINAL HYSTERECTOMY    . BREAST BIOPSY    . CATARACT EXTRACTION  2018  . CESAREAN SECTION    . DENTAL SURGERY    . EYE SURGERY    . FOOT SURGERY    . HERNIA REPAIR    . KNEE SURGERY    . TONSILLECTOMY AND ADENOIDECTOMY       OB History   No obstetric history on file.      Home Medications    Prior to Admission medications   Medication Sig Start Date End Date Taking? Authorizing Provider  acetaminophen (TYLENOL) 500 MG tablet Take 1,000 mg by mouth daily as needed for mild pain.    [provider]  Adalimumab 40 MG/0.8ML PNKT Inject into the skin. 01/31/18   [provider]  ammonium lactate (LAC-HYDRIN) 12 % lotion Apply 1 application topically daily as needed for dry skin. 01/03/18   [provider]  atropine 1 %  ophthalmic solution Place 1 drop into both eyes daily. 08/13/18   [provider]  azaTHIOprine (IMURAN) 50 MG tablet Take 25 mg by mouth daily. 09/17/18   [provider]  benzonatate (TESSALON) 100 MG capsule Take 1 capsule (100 mg total) by mouth every 8 (eight) hours. 11/15/16   Domenic Moras, PA-C  budesonide-formoterol (SYMBICORT) 160-4.5 MCG/ACT inhaler Inhale 2 puffs into the lungs 2 (two) times daily. 08/20/18   [provider]  buPROPion (WELLBUTRIN XL) 150 MG 24 hr tablet Take 150 mg by mouth daily. 06/18/18   [provider]  busPIRone (BUSPAR) 10 MG tablet Take 10 mg by mouth 2 (two) times daily.     [provider]  cyclobenzaprine (FLEXERIL) 10 MG tablet Take 10 mg by mouth 3 (three) times daily as needed for muscle spasms. 07/12/18   [provider]  DUREZOL 0.05 % EMUL Place 1 drop into both eyes 4 (four) times daily. 11/04/17   [provider]  ferrous gluconate (FERGON) 324 MG tablet Take 324 mg by mouth 2 (two) times daily.     [provider]  folic acid (FOLVITE) 1 MG tablet Take 1 mg by mouth daily. 09/18/17   [provider]  furosemide (LASIX) 40 MG tablet Take 40 mg by mouth daily.    [provider]  levothyroxine (SYNTHROID, LEVOTHROID) 50 MCG tablet Take 50 mcg by mouth daily before breakfast.    [provider]  lisinopril-hydrochlorothiazide (PRINZIDE,ZESTORETIC) 20-12.5 MG per tablet Take 1 tablet by mouth daily.    [provider]  methotrexate 2.5 MG tablet Take 15 mg by mouth once a week. 11/01/17   [provider]  omeprazole (PRILOSEC) 20 MG capsule Take 20 mg by mouth daily.    [provider]  potassium chloride (K-DUR,KLOR-CON) 10 MEQ tablet Take 10 mEq by mouth daily. 01/02/18   [provider]  pravastatin (PRAVACHOL) 40 MG tablet Take 40 mg by mouth daily.    [provider]  predniSONE (DELTASONE) 10 MG tablet Take 10 mg by mouth daily. 03/19/18   [provider]  rivaroxaban (XARELTO) 20 MG TABS tablet Take 20 mg by mouth every evening.     [provider]    Family History Family History  Problem Relation Age of Onset  . Alcoholism Father   . Arthritis Father   . Hypertension Father        sister, mother  . Diabetes Mellitus I Mother        sister  . Renal Disease Mother   . Stroke Mother   . Epilepsy Brother     Social History Social History   Tobacco Use  . Smoking status: Former Smoker    Last attempt to quit: 12/15/2003    Years since quitting: 14.9  . Smokeless tobacco: Current User    Types: Snuff  . Tobacco comment:  3 times per day  Substance Use Topics  . Alcohol use: Not on file  . Drug use: Not on file     Allergies   Contrast media [iodinated diagnostic agents]; Doxycycline; Albuterol sulfate; Codeine; and Erythromycin base   Review of Systems Review of Systems  Constitutional: Positive for fever.  Respiratory: Positive for shortness of breath.   Musculoskeletal: Positive for back pain.  All other systems reviewed and are negative.    Physical Exam Updated Vital Signs BP (!) 147/77 (BP Location: Right Arm)   Pulse 100   Temp 98.3 F (36.8 C) (Oral)  Resp 16   Ht 5\' 2"  (1.575 m)   Wt 95.7 kg   SpO2 95%   BMI 38.59 kg/m   Physical Exam Vitals signs and nursing note reviewed.  Constitutional:      General: She is not in acute distress.    Appearance: She is well-developed. She is not ill-appearing, toxic-appearing or diaphoretic.  HENT:     Head: Normocephalic and atraumatic.     Mouth/Throat:     Mouth: Mucous membranes are moist.  Neck:     Musculoskeletal: Normal range of motion and neck supple.  Cardiovascular:     Rate and Rhythm: Normal rate and regular rhythm.     Heart sounds: No murmur. No friction rub. No gallop.   Pulmonary:     Effort: Pulmonary effort is normal. No respiratory distress.     Breath sounds: Normal breath sounds. No wheezing.  Abdominal:     General: Bowel sounds are normal. There is no distension.     Palpations: Abdomen is soft.     Tenderness: There is no abdominal tenderness.  Musculoskeletal: Normal range of motion.     Right lower leg: She exhibits no tenderness. No edema.     Left lower leg: She exhibits no tenderness. No edema.  Skin:    General: Skin is warm and dry.  Neurological:     Mental Status: She is alert and oriented to person, place, and time.      ED Treatments / Results  Labs (all labs ordered are listed, but only abnormal results are displayed) Labs Reviewed  BASIC METABOLIC PANEL  CBC  URINALYSIS, ROUTINE W  REFLEX MICROSCOPIC  CBG MONITORING, ED    EKG EKG Interpretation  Date/Time:  Sunday November 10 2018 08:43:51 EST Ventricular Rate:  85 PR Interval:    QRS Duration: 94 QT Interval:  368 QTC Calculation: 438 R Axis:   56 Text Interpretation:  Sinus rhythm Confirmed by Veryl Speak [id number] on 11/10/2018 9:13:45 AM   Radiology No results found.  Procedures Procedures (including critical care time)  Medications Ordered in ED Medications - No data to display   Initial Impression / Assessment and Plan / ED Course  I have reviewed the triage vital signs and the nursing notes.  Pertinent labs & imaging results that were available during my care of the patient were reviewed by me and considered in my medical decision making (see chart for details).  Patient presents here with complaints of productive cough and upper back pain for the past 10 days.  She was treated with amoxicillin, however this did not help.  X-ray shows no evidence for pneumonia.  Laboratory studies were unremarkable with the exception of a glucose of 687.  She was given normal saline and insulin and her sugar is now up to 80.  Patient will be treated for bronchitis.  Appears stable for discharge with outpatient follow-up.  Final Clinical Impressions(s) / ED Diagnoses   Final diagnoses:  None    ED Discharge Orders    None       Veryl Speak, MD 11/10/18 1433

## 2019-03-25 ENCOUNTER — Encounter: Payer: Self-pay | Admitting: Neurology

## 2019-03-26 ENCOUNTER — Ambulatory Visit: Payer: Medicare Other | Admitting: Neurology

## 2019-04-23 ENCOUNTER — Other Ambulatory Visit: Payer: Self-pay | Admitting: Orthopedic Surgery

## 2019-04-23 DIAGNOSIS — M25511 Pain in right shoulder: Secondary | ICD-10-CM

## 2019-05-09 ENCOUNTER — Other Ambulatory Visit: Payer: Self-pay

## 2019-05-09 ENCOUNTER — Ambulatory Visit
Admission: RE | Admit: 2019-05-09 | Discharge: 2019-05-09 | Disposition: A | Discharge status: Home / Self Care | Payer: Medicare Other | Source: Ambulatory Visit | Attending: Orthopedic Surgery | Admitting: Orthopedic Surgery

## 2019-05-09 DIAGNOSIS — M25511 Pain in right shoulder: Secondary | ICD-10-CM

## 2019-05-27 ENCOUNTER — Other Ambulatory Visit: Payer: Self-pay | Admitting: Physical Medicine and Rehabilitation

## 2019-05-27 DIAGNOSIS — M5416 Radiculopathy, lumbar region: Secondary | ICD-10-CM

## 2019-06-07 ENCOUNTER — Other Ambulatory Visit: Payer: Medicare Other

## 2019-06-26 ENCOUNTER — Other Ambulatory Visit: Payer: Medicare Other

## 2019-07-08 ENCOUNTER — Ambulatory Visit
Admission: RE | Admit: 2019-07-08 | Discharge: 2019-07-08 | Disposition: A | Discharge status: Home / Self Care | Payer: Medicare Other | Source: Ambulatory Visit | Attending: Orthopedic Surgery | Admitting: Orthopedic Surgery

## 2019-07-23 ENCOUNTER — Other Ambulatory Visit: Payer: Medicare Other

## 2019-08-03 ENCOUNTER — Ambulatory Visit
Admission: RE | Admit: 2019-08-03 | Discharge: 2019-08-03 | Disposition: A | Discharge status: Home / Self Care | Payer: Medicare Other | Source: Ambulatory Visit | Attending: Physical Medicine and Rehabilitation | Admitting: Physical Medicine and Rehabilitation

## 2019-08-03 ENCOUNTER — Other Ambulatory Visit: Payer: Self-pay

## 2019-08-03 DIAGNOSIS — M5416 Radiculopathy, lumbar region: Secondary | ICD-10-CM

## 2019-12-04 ENCOUNTER — Ambulatory Visit (INDEPENDENT_AMBULATORY_CARE_PROVIDER_SITE_OTHER): Payer: Medicare Other | Admitting: Pulmonary Disease

## 2019-12-04 ENCOUNTER — Other Ambulatory Visit: Payer: Self-pay

## 2019-12-04 ENCOUNTER — Encounter: Payer: Self-pay | Admitting: Pulmonary Disease

## 2019-12-04 VITALS — BP 122/78 | HR 83 | Temp 97.3°F | Ht 62.0 in | Wt 205.2 lb

## 2019-12-04 DIAGNOSIS — Z9989 Dependence on other enabling machines and devices: Secondary | ICD-10-CM

## 2019-12-04 DIAGNOSIS — G4733 Obstructive sleep apnea (adult) (pediatric): Secondary | ICD-10-CM

## 2019-12-04 NOTE — Patient Instructions (Signed)
History of obstructive sleep apnea  We will contact [facility] to find out what your pressure settings are  Continue using your machine on a regular basis  I will see you back in about 6 weeks   Sleep Apnea Sleep apnea is a condition in which breathing pauses or becomes shallow during sleep. Episodes of sleep apnea usually last 10 seconds or longer, and they may occur as many as 20 times an hour. Sleep apnea disrupts your sleep and keeps your body from getting the rest that it needs. This condition can increase your risk of certain health problems, including:  Heart attack.  Stroke.  Obesity.  Diabetes.  Heart failure.  Irregular heartbeat. What are the causes? There are three kinds of sleep apnea:  Obstructive sleep apnea. This kind is caused by a blocked or collapsed airway.  Central sleep apnea. This kind happens when the part of the brain that controls breathing does not send the correct signals to the muscles that control breathing.  Mixed sleep apnea. This is a combination of obstructive and central sleep apnea. The most common cause of this condition is a collapsed or blocked airway. An airway can collapse or become blocked if:  Your throat muscles are abnormally relaxed.  Your tongue and tonsils are larger than normal.  You are overweight.  Your airway is smaller than normal. What increases the risk? You are more likely to develop this condition if you:  Are overweight.  Smoke.  Have a smaller than normal airway.  Are elderly.  Are female.  Drink alcohol.  Take sedatives or tranquilizers.  Have a family history of sleep apnea. What are the signs or symptoms? Symptoms of this condition include:  Trouble staying asleep.  Daytime sleepiness and tiredness.  Irritability.  Loud snoring.  Morning headaches.  Trouble concentrating.  Forgetfulness.  Decreased interest in sex.  Unexplained sleepiness.  Mood swings.  Personality  changes.  Feelings of depression.  Waking up often during the night to urinate.  Dry mouth.  Sore throat. How is this diagnosed? This condition may be diagnosed with:  A medical history.  A physical exam.  A series of tests that are done while you are sleeping (sleep study). These tests are usually done in a sleep lab, but they may also be done at home. How is this treated? Treatment for this condition aims to restore normal breathing and to ease symptoms during sleep. It may involve managing health issues that can affect breathing, such as high blood pressure or obesity. Treatment may include:  Sleeping on your side.  Using a decongestant if you have nasal congestion.  Avoiding the use of depressants, including alcohol, sedatives, and narcotics.  Losing weight if you are overweight.  Making changes to your diet.  Quitting smoking.  Using a device to open your airway while you sleep, such as: ? An oral appliance. This is a custom-made mouthpiece that shifts your lower jaw forward. ? A continuous positive airway pressure (CPAP) device. This device blows air through a mask when you breathe out (exhale). ? A nasal expiratory positive airway pressure (EPAP) device. This device has valves that you put into each nostril. ? A bi-level positive airway pressure (BPAP) device. This device blows air through a mask when you breathe in (inhale) and breathe out (exhale).  Having surgery if other treatments do not work. During surgery, excess tissue is removed to create a wider airway. It is important to get treatment for sleep apnea. Without treatment,  this condition can lead to:  High blood pressure.  Coronary artery disease.  In men, an inability to achieve or maintain an erection (impotence).  Reduced thinking abilities. Follow these instructions at home: Lifestyle  Make any lifestyle changes that your health care provider recommends.  Eat a healthy, well-balanced  diet.  Take steps to lose weight if you are overweight.  Avoid using depressants, including alcohol, sedatives, and narcotics.  Do not use any products that contain nicotine or tobacco, such as cigarettes, e-cigarettes, and chewing tobacco. If you need help quitting, ask your health care provider. General instructions  Take over-the-counter and prescription medicines only as told by your health care provider.  If you were given a device to open your airway while you sleep, use it only as told by your health care provider.  If you are having surgery, make sure to tell your health care provider you have sleep apnea. You may need to bring your device with you.  Keep all follow-up visits as told by your health care provider. This is important. Contact a health care provider if:  The device that you received to open your airway during sleep is uncomfortable or does not seem to be working.  Your symptoms do not improve.  Your symptoms get worse. Get help right away if:  You develop: ? Chest pain. ? Shortness of breath. ? Discomfort in your back, arms, or stomach.  You have: ? Trouble speaking. ? Weakness on one side of your body. ? Drooping in your face. These symptoms may represent a serious problem that is an emergency. Do not wait to see if the symptoms will go away. Get medical help right away. Call your local emergency services (911 in the [place] Do not drive yourself to the hospital. Summary  Sleep apnea is a condition in which breathing pauses or becomes shallow during sleep.  The most common cause is a collapsed or blocked airway.  The goal of treatment is to restore normal breathing and to ease symptoms during sleep. This information is not intended to replace advice given to you by your health care provider. Make sure you discuss any questions you have with your health care provider. Document Revised: 03/26/2019 Document Reviewed: 06/04/2018 Elsevier Patient Education   [place].

## 2019-12-04 NOTE — Progress Notes (Signed)
Jennifer Foley    [id number]    [birth year]  Primary Care Physician:Boyd, Dola Factor, MD  Referring Physician: Bartholome Bill, MD [place] [address] [place],  [place] [address]  Chief complaint:   Patient with a history of obstructive sleep apnea Diagnosed about 2019 In for initial evaluation here  HPI:  Study was performed 10/11/2017 Recommendation was to start on auto titrating CPAP  She does have daytime sleepiness Nonrestorative sleep Usually goes to bed between 12 and 3 AM Takes a few hours to fall asleep on some days She may wake up maybe once during the night Final awakening time between 10 AM and 1 PM  Admits to continuing snoring When she was initially set up she did use CPAP on a regular basis felt it was helpful Gradually got out of using it on a regular basis  She does have dryness of her mouth in the mornings, occasional headaches memory is fine  Almost daily naps  Feels her sleep is nonrestorative  Reformed smoker-quit in 2005  Outpatient Encounter Medications as of 12/04/2019  Medication Sig  . acetaminophen (TYLENOL) 500 MG tablet Take 1,000 mg by mouth every 6 (six) hours as needed for mild pain or headache.   . Adalimumab 40 MG/0.8ML PNKT Inject 40 mg into the skin every 14 (fourteen) days.   Marland Kitchen ammonium lactate (LAC-HYDRIN) 12 % lotion Apply 1 application topically daily as needed for dry skin.  Marland Kitchen azaTHIOprine (IMURAN) 50 MG tablet Take 50 mg by mouth 2 (two) times daily.   . budesonide-formoterol (SYMBICORT) 160-4.5 MCG/ACT inhaler Inhale 2 puffs into the lungs 2 (two) times daily.  Marland Kitchen buPROPion (WELLBUTRIN XL) 150 MG 24 hr tablet Take 150 mg by mouth daily.  . busPIRone (BUSPAR) 10 MG tablet Take 10 mg by mouth 2 (two) times daily.   . cyclobenzaprine (FLEXERIL) 10 MG tablet Take 10 mg by mouth 3 (three) times daily as needed for muscle spasms.  . furosemide (LASIX) 40 MG tablet Take 40 mg by mouth daily.  Marland Kitchen  gabapentin (NEURONTIN) 100 MG capsule Take 300 mg by mouth at bedtime.  . Insulin Glargine (LANTUS SOLOSTAR) 100 UNIT/ML Solostar Pen Inject into the skin.  Marland Kitchen insulin lispro (HUMALOG) 100 UNIT/ML KwikPen Inject into the skin.  Marland Kitchen levothyroxine (SYNTHROID, LEVOTHROID) 50 MCG tablet Take 50 mcg by mouth daily before breakfast.  . omeprazole (PRILOSEC) 20 MG capsule Take 20 mg by mouth daily.  . potassium chloride (K-DUR,KLOR-CON) 10 MEQ tablet Take 10 mEq by mouth daily.  . pravastatin (PRAVACHOL) 40 MG tablet Take 40 mg by mouth daily.  . prednisoLONE acetate (PRED FORTE) 1 % ophthalmic suspension Place 1 drop into both eyes 4 (four) times daily.  . rivaroxaban (XARELTO) 20 MG TABS tablet Take 20 mg by mouth every evening.   . traMADol (ULTRAM) 50 MG tablet Take 1 tablet by mouth twice daily as needed  . [DISCONTINUED] dextromethorphan-guaiFENesin (MUCINEX DM) 30-600 MG 12hr tablet Take 1 tablet by mouth daily as needed for cough.  . [DISCONTINUED] atropine 1 % ophthalmic solution Place 1 drop into both eyes daily.  . [DISCONTINUED] chlorpheniramine-HYDROcodone (TUSSIONEX PENNKINETIC ER) 10-8 MG/5ML SUER Take 5 mLs by mouth every 12 (twelve) hours as needed for cough.  . [DISCONTINUED] ferrous gluconate (FERGON) 324 MG tablet Take 324 mg by mouth daily.   . [DISCONTINUED] folic acid (FOLVITE) 1 MG tablet Take 1 mg by mouth daily.  . [DISCONTINUED] levofloxacin (LEVAQUIN)  750 MG tablet Take 1 tablet (750 mg total) by mouth daily. X 7 days  . [DISCONTINUED] predniSONE (DELTASONE) 10 MG tablet Take 10 mg by mouth daily.   No facility-administered encounter medications on file as of 12/04/2019.    Allergies as of 12/04/2019 - Review Complete 12/04/2019  Allergen Reaction Noted  . Contrast media [iodinated diagnostic agents] Other (See Comments) 08/13/2018  . Doxycycline Other (See Comments) 08/13/2018  . Albuterol Rash 11/10/2018  . Albuterol sulfate Rash 07/30/2018  . Amoxicillin Rash 12/03/2018   . Codeine Other (See Comments) 01/20/2013  . Erythromycin base Diarrhea and Rash 07/11/2018    Past Medical History:  Diagnosis Date  . Anemia   . Atrial fibrillation ([facility] [facility])   . Bilateral breast cysts   . CHF (congestive heart failure) ([place])   . Chronic kidney disease    stage 3  . Depression with anxiety   . DM (diabetes mellitus) ([facility])   . Esophageal reflux   . HTN (hypertension)   . Hyperlipemia   . Hyperthyroidism   . INR (international normal ratio) abnormal   . MRSA (methicillin resistant staph aureus) culture positive   . Pneumonia   . PTSD (post-traumatic stress disorder)     Past Surgical History:  Procedure Laterality Date  . ABDOMINAL HYSTERECTOMY    . BREAST BIOPSY    . CATARACT EXTRACTION  2018  . CESAREAN SECTION    . DENTAL SURGERY    . EYE SURGERY    . FOOT SURGERY    . HERNIA REPAIR    . KNEE SURGERY    . TONSILLECTOMY AND ADENOIDECTOMY      Family History  Problem Relation Age of Onset  . Alcoholism Father   . Arthritis Father   . Hypertension Father        sister, mother  . Diabetes Mellitus I Mother        sister  . Renal Disease Mother   . Stroke Mother   . Epilepsy Brother     Social History   Socioeconomic History  . Marital status: Legally Separated    Spouse name: Not on file  . Number of children: Not on file  . Years of education: Not on file  . Highest education level: Not on file  Occupational History  . Not on file  Tobacco Use  . Smoking status: Former Smoker    Packs/day: 0.25    Years: 34.00    Pack years: 8.50    Types: Cigarettes    Quit date: 12/15/2003    Years since quitting: 15.9  . Smokeless tobacco: Current User    Types: Snuff  . Tobacco comment: 3 times per day  Substance and Sexual Activity  . Alcohol use: Not on file  . Drug use: Not on file  . Sexual activity: Not on file  Other Topics Concern  . Not on file  Social History Narrative  . Not on file   Social Determinants of Health    Financial Resource Strain:   . Difficulty of Paying Living Expenses: Not on file  Food Insecurity:   . Worried About Charity fundraiser in the Last Year: Not on file  . Ran Out of Food in the Last Year: Not on file  Transportation Needs:   . Lack of Transportation (Medical): Not on file  . Lack of Transportation (Non-Medical): Not on file  Physical Activity:   . Days of Exercise per Week: Not on file  . Minutes of Exercise  per Session: Not on file  Stress:   . Feeling of Stress : Not on file  Social Connections:   . Frequency of Communication with Friends and Family: Not on file  . Frequency of Social Gatherings with Friends and Family: Not on file  . Attends Religious Services: Not on file  . Active Member of Clubs or Organizations: Not on file  . Attends Archivist Meetings: Not on file  . Marital Status: Not on file  Intimate Partner Violence:   . Fear of Current or Ex-Partner: Not on file  . Emotionally Abused: Not on file  . Physically Abused: Not on file  . Sexually Abused: Not on file    Review of Systems  Vitals:   12/04/19 1448  BP: 122/78  Pulse: 83  Temp: (!) 97.3 F (36.3 C)  SpO2: 100%     Physical Exam  Constitutional: She appears well-developed and well-nourished.  HENT:  Head: Normocephalic.  Eyes: Pupils are equal, round, and reactive to light. Right eye exhibits no discharge.  Neck: No tracheal deviation present. No thyromegaly present.  Cardiovascular: Normal rate and regular rhythm.  Pulmonary/Chest: Effort normal and breath sounds normal. No respiratory distress. She has no wheezes. She has no rales. She exhibits no tenderness.  Musculoskeletal:        General: Normal range of motion.     Cervical back: Normal range of motion and neck supple.  Neurological: She is alert.  Skin: Skin is warm and dry.    Study was reviewed -Mild obstructive sleep apnea -Recommendation was for auto titrating CPAP  Assessment:  Mild obstructive  sleep apnea -History of noncompliance -She does have significant symptoms at present She is willing to start using CPAP on a regular basis  Pathophysiology of sleep disordered breathing discussed with the patient  Treatment options for sleep disordered breathing discussed with the patient  Plan/Recommendations: Encouraged to start using CPAP on a regular basis  We will contact [facility] to get information about settings  We will follow-up in about 6 weeks  Encouraged to start using CPAP on a regular basis We will follow-up in about 6 weeks   Sherrilyn Rist MD  Pulmonary and Critical Care 12/04/2019, 3:11 PM  CC: Bartholome Bill, MD

## 2020-01-15 ENCOUNTER — Other Ambulatory Visit: Payer: Self-pay

## 2020-01-15 ENCOUNTER — Ambulatory Visit (INDEPENDENT_AMBULATORY_CARE_PROVIDER_SITE_OTHER): Payer: Medicare Other | Admitting: Pulmonary Disease

## 2020-01-15 ENCOUNTER — Encounter: Payer: Self-pay | Admitting: Pulmonary Disease

## 2020-01-15 VITALS — BP 120/70 | HR 71 | Temp 97.6°F | Ht 60.0 in | Wt 202.0 lb

## 2020-01-15 DIAGNOSIS — G4733 Obstructive sleep apnea (adult) (pediatric): Secondary | ICD-10-CM

## 2020-01-15 DIAGNOSIS — Z9989 Dependence on other enabling machines and devices: Secondary | ICD-10-CM

## 2020-01-15 NOTE — Progress Notes (Signed)
Jennifer Foley    [id number]    May 02, 1956  Primary Care Physician:Boyd, Dola Factor, MD  Referring Physician: Bartholome Bill, MD [address] [place],  [place] [address]  Chief complaint:   Patient with a history of obstructive sleep apnea Diagnosed about 2019  HPI:  She has not been using CPAP on a regular basis She states that she has a lot of mask leaks, mask does not appear to fit well Lives amount dry in the mornings  Study was performed 10/11/2017  She does have daytime sleepiness Nonrestorative sleep Usually goes to bed between 12 and 3 AM Takes a few hours to fall asleep on some days She may wake up maybe once during the night Final awakening time between 10 AM and 1 PM  Admits to continuing snoring When she was initially set up she did use CPAP on a regular basis felt it was helpful Gradually got out of using it on a regular basis  She does have dryness of her mouth in the mornings, occasional headaches memory is fine  Almost daily naps  Feels her sleep is nonrestorative  Reformed smoker-quit in 2005  Outpatient Encounter Medications as of 01/15/2020  Medication Sig  . acetaminophen (TYLENOL) 500 MG tablet Take 1,000 mg by mouth every 6 (six) hours as needed for mild pain or headache.   . Adalimumab 40 MG/0.8ML PNKT Inject 40 mg into the skin every 14 (fourteen) days.   Marland Kitchen ammonium lactate (LAC-HYDRIN) 12 % lotion Apply 1 application topically daily as needed for dry skin.  Marland Kitchen azaTHIOprine (IMURAN) 50 MG tablet Take 50 mg by mouth 2 (two) times daily.   . budesonide-formoterol (SYMBICORT) 160-4.5 MCG/ACT inhaler Inhale 2 puffs into the lungs 2 (two) times daily.  Marland Kitchen buPROPion (WELLBUTRIN XL) 150 MG 24 hr tablet Take 150 mg by mouth daily.  . busPIRone (BUSPAR) 10 MG tablet Take 10 mg by mouth 2 (two) times daily.   . cyclobenzaprine (FLEXERIL) 10 MG tablet Take 10 mg by mouth 3 (three) times daily as needed for muscle  spasms.  . furosemide (LASIX) 40 MG tablet Take 40 mg by mouth daily.  Marland Kitchen gabapentin (NEURONTIN) 100 MG capsule Take 300 mg by mouth at bedtime.  . Insulin Glargine (LANTUS SOLOSTAR) 100 UNIT/ML Solostar Pen Inject into the skin.  Marland Kitchen insulin lispro (HUMALOG) 100 UNIT/ML KwikPen Inject into the skin.  Marland Kitchen levothyroxine (SYNTHROID, LEVOTHROID) 50 MCG tablet Take 50 mcg by mouth daily before breakfast.  . omeprazole (PRILOSEC) 20 MG capsule Take 20 mg by mouth daily.  . potassium chloride (K-DUR,KLOR-CON) 10 MEQ tablet Take 10 mEq by mouth daily.  . pravastatin (PRAVACHOL) 40 MG tablet Take 40 mg by mouth daily.  . prednisoLONE acetate (PRED FORTE) 1 % ophthalmic suspension Place 1 drop into both eyes 4 (four) times daily.  . rivaroxaban (XARELTO) 20 MG TABS tablet Take 20 mg by mouth every evening.   . traMADol (ULTRAM) 50 MG tablet Take 1 tablet by mouth twice daily as needed   No facility-administered encounter medications on file as of 01/15/2020.    Allergies as of 01/15/2020 - Review Complete 01/15/2020  Allergen Reaction Noted  . Contrast media [iodinated diagnostic agents] Other (See Comments) 08/13/2018  . Doxycycline Other (See Comments) 08/13/2018  . Albuterol Rash 11/10/2018  . Albuterol sulfate Rash 07/30/2018  . Amoxicillin Rash 12/03/2018  . Codeine Other (See Comments) 01/20/2013  . Erythromycin base Diarrhea and  Rash 07/11/2018    Past Medical History:  Diagnosis Date  . Anemia   . Atrial fibrillation ([facility])   . Bilateral breast cysts   . CHF (congestive heart failure) ([facility])   . Chronic kidney disease    stage 3  . Depression with anxiety   . DM (diabetes mellitus) ([facility])   . Esophageal reflux   . HTN (hypertension)   . Hyperlipemia   . Hyperthyroidism   . INR (international normal ratio) abnormal   . MRSA (methicillin resistant staph aureus) culture positive   . Pneumonia   . PTSD (post-traumatic stress disorder)     Past Surgical History:  Procedure Laterality  Date  . ABDOMINAL HYSTERECTOMY    . BREAST BIOPSY    . CATARACT EXTRACTION  2018  . CESAREAN SECTION    . DENTAL SURGERY    . EYE SURGERY    . FOOT SURGERY    . HERNIA REPAIR    . KNEE SURGERY    . TONSILLECTOMY AND ADENOIDECTOMY      Family History  Problem Relation Age of Onset  . Alcoholism Father   . Arthritis Father   . Hypertension Father        sister, mother  . Diabetes Mellitus I Mother        sister  . Renal Disease Mother   . Stroke Mother   . Epilepsy Brother     Social History   Socioeconomic History  . Marital status: Legally Separated    Spouse name: Not on file  . Number of children: Not on file  . Years of education: Not on file  . Highest education level: Not on file  Occupational History  . Not on file  Tobacco Use  . Smoking status: Former Smoker    Packs/day: 0.25    Years: 34.00    Pack years: 8.50    Types: Cigarettes    Quit date: 12/15/2003    Years since quitting: 16.0  . Smokeless tobacco: Current User    Types: Snuff  . Tobacco comment: 3 times per day  Substance and Sexual Activity  . Alcohol use: Not on file  . Drug use: Not on file  . Sexual activity: Not on file  Other Topics Concern  . Not on file  Social History Narrative  . Not on file   Social Determinants of Health   Financial Resource Strain:   . Difficulty of Paying Living Expenses:   Food Insecurity:   . Worried About Charity fundraiser in the Last Year:   . Arboriculturist in the Last Year:   Transportation Needs:   . Film/video editor (Medical):   Marland Kitchen Lack of Transportation (Non-Medical):   Physical Activity:   . Days of Exercise per Week:   . Minutes of Exercise per Session:   Stress:   . Feeling of Stress :   Social Connections:   . Frequency of Communication with Friends and Family:   . Frequency of Social Gatherings with Friends and Family:   . Attends Religious Services:   . Active Member of Clubs or Organizations:   . Attends Theatre manager Meetings:   Marland Kitchen Marital Status:   Intimate Partner Violence:   . Fear of Current or Ex-Partner:   . Emotionally Abused:   Marland Kitchen Physically Abused:   . Sexually Abused:     Review of Systems  Respiratory: Positive for apnea.   Psychiatric/Behavioral: Positive for sleep disturbance.  Vitals:   01/15/20 1407  BP: 120/70  Pulse: 71  Temp: 97.6 F (36.4 C)  SpO2: 95%     Physical Exam  Constitutional: She appears well-developed and well-nourished.  HENT:  Head: Normocephalic.  Eyes: Pupils are equal, round, and reactive to light. Right eye exhibits no discharge.  Neck: No tracheal deviation present. No thyromegaly present.  Cardiovascular: Normal rate and regular rhythm.  Pulmonary/Chest: Effort normal and breath sounds normal. No respiratory distress. She has no wheezes. She has no rales. She exhibits no tenderness.  Musculoskeletal:     Cervical back: Normal range of motion and neck supple.    Study was reviewed -Mild obstructive sleep apnea -Recommendation was for auto titrating CPAP  CPAP compliance revealed only 2 attempts at using CPAP in the last month AHI of 5.2  Assessment:  Mild obstructive sleep apnea -History of noncompliance -She continues to have daytime sleepiness We did talk about getting her a new mask -We will contact [facility]  Pathophysiology of sleep disordered breathing discussed with the patient  Treatment options for sleep disordered breathing discussed with the patient  Plan/Recommendations: Encouraged to use CPAP on a regular basis  We will send prescription to [facility] for new mask supplies  Follow-up in 3 months  Encouraged to call with any significant concerns   Sherrilyn Rist MD [facility] 01/15/2020, 2:11 PM  CC: Bartholome Bill, MD

## 2020-01-15 NOTE — Patient Instructions (Signed)
Obstructive sleep apnea  Compliance with CPAP has been poor because of mask issues  We will get a prescription to [facility] for new mask  Try and use your CPAP on a regular basis Risk of not treating your sleep apnea as we discussed today  Call with significant concerns, I will see you in 3 months

## 2020-02-18 ENCOUNTER — Ambulatory Visit: Payer: Medicare Other | Admitting: Orthopaedic Surgery

## 2020-02-24 ENCOUNTER — Ambulatory Visit: Payer: Medicare Other | Admitting: Orthopaedic Surgery

## 2020-02-25 ENCOUNTER — Ambulatory Visit: Payer: Medicare Other | Admitting: Orthopaedic Surgery

## 2020-03-05 ENCOUNTER — Ambulatory Visit: Payer: Medicare Other | Admitting: Orthopaedic Surgery

## 2020-03-09 ENCOUNTER — Ambulatory Visit (INDEPENDENT_AMBULATORY_CARE_PROVIDER_SITE_OTHER): Payer: Medicare Other | Admitting: Orthopaedic Surgery

## 2020-03-09 ENCOUNTER — Ambulatory Visit: Payer: Self-pay

## 2020-03-09 ENCOUNTER — Other Ambulatory Visit: Payer: Self-pay

## 2020-03-09 VITALS — Ht 62.0 in | Wt 204.0 lb

## 2020-03-09 DIAGNOSIS — M25511 Pain in right shoulder: Secondary | ICD-10-CM | POA: Diagnosis not present

## 2020-03-09 DIAGNOSIS — G8929 Other chronic pain: Secondary | ICD-10-CM

## 2020-03-09 DIAGNOSIS — M65331 Trigger finger, right middle finger: Secondary | ICD-10-CM | POA: Diagnosis not present

## 2020-03-09 DIAGNOSIS — M25562 Pain in left knee: Secondary | ICD-10-CM

## 2020-03-09 DIAGNOSIS — M2242 Chondromalacia patellae, left knee: Secondary | ICD-10-CM | POA: Diagnosis not present

## 2020-03-09 MED ORDER — METHYLPREDNISOLONE ACETATE 40 MG/ML IJ SUSP
13.3300 mg | INTRAMUSCULAR | Status: AC | PRN
  Administered 2020-03-09: 13.33 mg

## 2020-03-09 MED ORDER — LIDOCAINE HCL 1 % IJ SOLN
0.3000 mL | INTRAMUSCULAR | Status: AC | PRN
  Administered 2020-03-09: .3 mL

## 2020-03-09 MED ORDER — BUPIVACAINE HCL 0.5 % IJ SOLN
0.3300 mL | INTRAMUSCULAR | Status: AC | PRN
Start: 2020-03-09 — End: 2020-03-09
  Administered 2020-03-09: .33 mL

## 2020-03-09 NOTE — Progress Notes (Signed)
Office Visit Note   Patient: Jennifer Foley           Date of Birth: 30-Mar-1956           MRN: [id number] Visit Date: 03/09/2020              Requested by: Bartholome Bill, MD [place],  [place] [address] PCP: Bartholome Bill, MD   Assessment & Plan: Visit Diagnoses:  1. Chronic right shoulder pain   2. Chronic pain of left knee   3. Trigger finger, right middle finger   4. Chondromalacia patellae, left knee     Plan: Injection performed for trigger finger with good relief.  She will return in couple weeks and we can discuss treatment for her left knee.  She has significant crepitus in her left knee with patellofemoral loading and we will defer injection with her history of borderline diabetes or prediabetes.  Recheck 2 weeks.  Follow-Up Instructions: Return in about 2 weeks (around 03/23/2020).   Orders:  Orders Placed This Encounter  Procedures  . Hand/UE Inj: L long A1  . XR Knee 1-2 Views Left  . XR Shoulder Right   No orders of the defined types were placed in this encounter.     Procedures: Hand/UE Inj: L long A1 for trigger finger on 03/09/2020 4:02 PM Medications: 0.3 mL lidocaine 1 %; 0.33 mL bupivacaine 0.5 %; 13.33 mg methylPREDNISolone acetate 40 MG/ML      Clinical Data: No additional findings.   Subjective: Chief Complaint  Patient presents with  . Right Shoulder - Pain  . Left Knee - Pain  . Right Middle Finger - Pain    HPI 64 year old female here with 3 problems 1 is right long finger which has been triggering for 2 months and she cannot flex it.  The other problem is right shoulder which previously was evaluated by Dr. Lorre Nick showed intersubstance significant tear at the supraspinatus insertion site with persistent pain with outstretched reaching abduction trying to fix her hair.  Third problem is been her left knee which she cannot go up steps with.  Previous knee arthroscopy 2008 2009 and was told she  needed a knee replacement sometime.  She had increased problems walking problems with stairs have to go up steps right foot first and then repeat.  If she is active she has increased swelling.  Review of Systems Positive for hypertension sleep apnea previous UTIs hypothyroidism knee arthroscopy.  Obesity BMI 37.  Otherwise negative as pertains HPI.  Objective: Vital Signs: Ht 5\' 2"  (1.575 m)   Wt 204 lb (92.5 kg)   BMI 37.31 kg/m   Physical Exam Constitutional:      Appearance: She is well-developed.  HENT:     Head: Normocephalic.     Right Ear: External ear normal.     Left Ear: External ear normal.  Eyes:     Pupils: Pupils are equal, round, and reactive to light.  Neck:     Thyroid: No thyromegaly.     Trachea: No tracheal deviation.  Cardiovascular:     Rate and Rhythm: Normal rate.  Pulmonary:     Effort: Pulmonary effort is normal.  Abdominal:     Palpations: Abdomen is soft.  Skin:    General: Skin is warm and dry.  Neurological:     Mental Status: She is alert and oriented to person, place, and time.  Psychiatric:  Behavior: Behavior normal.     Ortho Exam patient has tenderness over the A1 pulley right long finger and is unable to flex.  Thickness noted in the tendon with flexion extension attempt.  Left knee shows significant crepitus severe pain with patellofemoral loading and attempted quadriceps contracture less severe on the right collateral ligaments are intact trace effusion left knee none on the right distal pulses are intact negative logroll the hips.  Positive impingement right shoulder pain with supraspinatus testing but negative drop arm test.  Acromioclavicular joint is tender with palpation. Specialty Comments:  No specialty comments available.  Imaging: No results found.   PMFS History: Patient Active Problem List   Diagnosis Date Noted  . Chondromalacia patellae, left knee 03/09/2020  . Trigger finger, right middle finger 03/09/2020    . Noncompliance with CPAP treatment 09/24/2018  . A-fib ([place]) 08/31/2018  . CHF (congestive heart failure) ([place]) 08/31/2018  . Acute pyelonephritis 08/31/2018  . Sepsis secondary to UTI ([place]) 08/31/2018  . Leucocytosis 08/31/2018  . Hypothyroidism 08/31/2018  . CKD (chronic kidney disease) stage 3, GFR 30-59 ml/min 08/31/2018  . HTN (hypertension) 08/31/2018  . HLD (hyperlipidemia) 08/31/2018  . Anxiety and depression 08/31/2018  . Obesity 08/31/2018  . Sepsis ([place]) 08/31/2018  . Back pain 08/31/2018  . Obstructive sleep apnea treated with continuous positive airway pressure (CPAP) 08/13/2018   Past Medical History:  Diagnosis Date  . Anemia   . Atrial fibrillation ([place])   . Bilateral breast cysts   . CHF (congestive heart failure) ([place])   . Chronic kidney disease    stage 3  . Depression with anxiety   . DM (diabetes mellitus) ([place])   . Esophageal reflux   . HTN (hypertension)   . Hyperlipemia   . Hyperthyroidism   . INR (international normal ratio) abnormal   . MRSA (methicillin resistant staph aureus) culture positive   . Pneumonia   . PTSD (post-traumatic stress disorder)     Family History  Problem Relation Age of Onset  . Alcoholism Father   . Arthritis Father   . Hypertension Father        sister, mother  . Diabetes Mellitus I Mother        sister  . Renal Disease Mother   . Stroke Mother   . Epilepsy Brother     Past Surgical History:  Procedure Laterality Date  . ABDOMINAL HYSTERECTOMY    . BREAST BIOPSY    . CATARACT EXTRACTION  2018  . CESAREAN SECTION    . DENTAL SURGERY    . EYE SURGERY    . FOOT SURGERY    . HERNIA REPAIR    . KNEE SURGERY    . TONSILLECTOMY AND ADENOIDECTOMY     Social History   Occupational History  . Not on file  Tobacco Use  . Smoking status: Former Smoker    Packs/day: 0.25    Years: 34.00    Pack years: 8.50    Types: Cigarettes    Quit date: 12/15/2003    Years since quitting: 16.2  . Smokeless tobacco:  Current User    Types: Snuff  . Tobacco comment: 3 times per day  Substance and Sexual Activity  . Alcohol use: Not on file  . Drug use: Not on file  . Sexual activity: Not on file

## 2020-03-24 ENCOUNTER — Ambulatory Visit (INDEPENDENT_AMBULATORY_CARE_PROVIDER_SITE_OTHER): Payer: Medicare Other | Admitting: Orthopaedic Surgery

## 2020-03-24 ENCOUNTER — Other Ambulatory Visit: Payer: Self-pay

## 2020-03-24 ENCOUNTER — Encounter: Payer: Self-pay | Admitting: Orthopaedic Surgery

## 2020-03-24 DIAGNOSIS — M1712 Unilateral primary osteoarthritis, left knee: Secondary | ICD-10-CM | POA: Diagnosis not present

## 2020-03-24 NOTE — Progress Notes (Signed)
Office Visit Note   Patient: Jennifer Foley           Date of Birth: 05-19-1956           MRN: [id number] Visit Date: 03/24/2020              Requested by: Bartholome Bill, MD [place],  [place] [address] PCP: Bartholome Bill, MD   Assessment & Plan: Visit Diagnoses:  1. Unilateral primary osteoarthritis, left knee     Plan: Patient states she is ready to proceed with total knee arthroplasty.  She need medical clearance with her past renal and cardiac problems.  I discussed with her that her PCP may recommend additional studies preoperatively.  We discussed anesthesia spinal versus general.  At least overnight stay in the hospital possible 2 nights.  She need to make arrangements for family to be available to help her which would be her daughter for likely a week after the surgery while she is progressing with therapy.  We discussed home therapy for 2 weeks followed by outpatient therapy for several weeks for rehabilitation we discussed the effort required to get a satisfactory total knee arthroplasty.  Patient understands request to proceed.  Follow-Up Instructions: No follow-ups on file.   Orders:  No orders of the defined types were placed in this encounter.  No orders of the defined types were placed in this encounter.     Procedures: No procedures performed   Clinical Data: No additional findings.   Subjective: Chief Complaint  Patient presents with  . Right Shoulder - Pain  . Right Knee - Pain  . Right Hand - Pain    HPI 64 year old female returns with progressive left knee osteoarthritis.  She is taken anti-inflammatories in the past also Tylenol.  She is used a cane intermittently and has had cortisone injections released to cortisone injections also Visco injection without relief.  She has to be careful with anti-inflammatories due to her kidney disease as well as history of heart failure.  Previous trigger finger injection  doing well no active triggering.  She also has some problems with her right shoulder with rotator cuff tendinopathy without complete tear.  There is some intrasubstance degeneration of the supraspinatus at the insertion site.  She is able to get her arm up overhead when she bypasses and brings it in flexion.  Left knee is progressed to the point where it limits her from ambulation in the community and wakes her up at times.  Patient states she is ready to proceed with total knee arthroplasty.  Patient's PCP is Dr. Precious Haws.  Previous knee arthroscopy 2009.  Patient has been considering total knee arthroplasty for the last 3 years.  Review of Systems negative for dyspnea.  Positive for [place] history of stage III kidney disease history of heart failure without dyspnea or angina.  Hypertension, atrial fib in the past..  Obstructive sleep apnea.  History of noncompliance with CPAP.  Otherwise negative is obtains HPI.   Objective: Vital Signs: There were no vitals taken for this visit.  Weight 204, height 5\' 2" , BMI 37.3.  Physical Exam Constitutional:      Appearance: She is well-developed.  HENT:     Head: Normocephalic.     Right Ear: External ear normal.     Left Ear: External ear normal.  Eyes:     Pupils: Pupils are equal, round, and reactive to light.  Neck:  Thyroid: No thyromegaly.     Trachea: No tracheal deviation.  Cardiovascular:     Rate and Rhythm: Normal rate.  Pulmonary:     Effort: Pulmonary effort is normal.  Abdominal:     Palpations: Abdomen is soft.  Skin:    General: Skin is warm and dry.  Neurological:     Mental Status: She is alert and oriented to person, place, and time.  Psychiatric:        Behavior: Behavior normal.     Ortho Exam patient has crepitus with not left knee range of motion.  She is not able to go up the steps with her left knee due to pain.  Collateral ligaments are stable.  Well-healed arthroscopic portals left knee.  Specialty Comments:    No specialty comments available.  Imaging: CLINICAL DATA:  Fall, knee pain and instability. Prior knee surgery.  EXAM: MRI OF THE LEFT KNEE WITHOUT CONTRAST  TECHNIQUE: Multiplanar, multisequence MR imaging of the knee was performed. No intravenous contrast was administered.  COMPARISON:  None.  FINDINGS: MENISCI  Medial meniscus:  Slightly diminutive but otherwise unremarkable.  Lateral meniscus:  Unremarkable  LIGAMENTS  Cruciates:  Unremarkable  Collaterals: Mild edema tracks adjacent to the MCL. This can be incidental but in the appropriate clinical circumstance could represent grade 1 sprain.  CARTILAGE  Patellofemoral: Severe full-thickness chondral loss along the lateral patellar facet and posterior patellar ridge with underlying subcortical foci of edema. Moderate chondral thinning in the femoral trochlear groove and mild chondral thinning along the medial patellar facet. Marginal spurring.  Medial: Mild to moderate degenerative chondral thinning with marginal spurring.  Lateral:  Mild degenerative chondral thinning.  Joint:  Small knee joint effusion.  Popliteal Fossa: Mild semimembranosus-tibial collateral ligament bursitis and mild pes anserine bursitis.  Extensor Mechanism:  Unremarkable  Bones: Ill-defined 3.7 by 3.0 by 3.6 cm region of heterogeneous high T2 and low T1 signal interspersed with the marrow in the proximal tibial metaphysis extending up to the tibial spine. The appearance is nonspecific for red marrow versus a marrow infiltrative process.  Other: No supplemental non-categorized findings.  IMPRESSION: 1. 3.7 cm region of accentuated T2 and reduced T1 signal interspersed with marrow centrally in the proximal tibia. This could be from red marrow redistribution but could also be from an infiltrative marrow process. Initially correlate with CBC with differential white blood cell count in assessing for signs  of hematopoietic disorder. 2. Osteoarthritis most severe laterally in the patellofemoral joint where there is full-thickness loss of articular cartilage. 3. Small knee effusion. 4. Mild semimembranosus-tibial collateral ligament bursitis and mild adjacent pes anserine bursitis. 5. Mild edema tracks adjacent to the MCL. This can be incidental but in the appropriate clinical circumstance could represent grade 1 sprain.   Electronically Signed   By: Van Clines M.D.   On: 10/29/2018 14:27    PMFS History: Patient Active Problem List   Diagnosis Date Noted  . Unilateral primary osteoarthritis, left knee 03/24/2020  . Trigger finger, right middle finger 03/09/2020  . Noncompliance with CPAP treatment 09/24/2018  . A-fib ([place]) 08/31/2018  . CHF (congestive heart failure) ([place]) 08/31/2018  . Acute pyelonephritis 08/31/2018  . Sepsis secondary to UTI ([place]) 08/31/2018  . Leucocytosis 08/31/2018  . Hypothyroidism 08/31/2018  . CKD (chronic kidney disease) stage 3, GFR 30-59 ml/min 08/31/2018  . HTN (hypertension) 08/31/2018  . HLD (hyperlipidemia) 08/31/2018  . Anxiety and depression 08/31/2018  . Obesity 08/31/2018  . Sepsis ([place]) 08/31/2018  .  Back pain 08/31/2018  . Obstructive sleep apnea treated with continuous positive airway pressure (CPAP) 08/13/2018   Past Medical History:  Diagnosis Date  . Anemia   . Atrial fibrillation ([place])   . Bilateral breast cysts   . CHF (congestive heart failure) ([place])   . Chronic kidney disease    stage 3  . Depression with anxiety   . DM (diabetes mellitus) ([place])   . Esophageal reflux   . HTN (hypertension)   . Hyperlipemia   . Hyperthyroidism   . INR (international normal ratio) abnormal   . MRSA (methicillin resistant staph aureus) culture positive   . Pneumonia   . PTSD (post-traumatic stress disorder)     Family History  Problem Relation Age of Onset  . Alcoholism Father   . Arthritis Father   . Hypertension Father         sister, mother  . Diabetes Mellitus I Mother        sister  . Renal Disease Mother   . Stroke Mother   . Epilepsy Brother     Past Surgical History:  Procedure Laterality Date  . ABDOMINAL HYSTERECTOMY    . BREAST BIOPSY    . CATARACT EXTRACTION  2018  . CESAREAN SECTION    . DENTAL SURGERY    . EYE SURGERY    . FOOT SURGERY    . HERNIA REPAIR    . KNEE SURGERY    . TONSILLECTOMY AND ADENOIDECTOMY     Social History   Occupational History  . Not on file  Tobacco Use  . Smoking status: Former Smoker    Packs/day: 0.25    Years: 34.00    Pack years: 8.50    Types: Cigarettes    Quit date: 12/15/2003    Years since quitting: 16.2  . Smokeless tobacco: Current User    Types: Snuff  . Tobacco comment: 3 times per day  Substance and Sexual Activity  . Alcohol use: Not on file  . Drug use: Not on file  . Sexual activity: Not on file

## 2020-04-05 ENCOUNTER — Other Ambulatory Visit: Payer: Self-pay

## 2020-04-07 ENCOUNTER — Ambulatory Visit: Payer: Medicare Other | Admitting: Surgery

## 2020-04-08 ENCOUNTER — Encounter: Payer: Self-pay | Admitting: Surgery

## 2020-04-08 ENCOUNTER — Ambulatory Visit (INDEPENDENT_AMBULATORY_CARE_PROVIDER_SITE_OTHER): Payer: Medicare Other | Admitting: Surgery

## 2020-04-08 VITALS — BP 122/80 | HR 72

## 2020-04-08 DIAGNOSIS — G8929 Other chronic pain: Secondary | ICD-10-CM

## 2020-04-08 DIAGNOSIS — M25562 Pain in left knee: Secondary | ICD-10-CM

## 2020-04-08 DIAGNOSIS — M1712 Unilateral primary osteoarthritis, left knee: Secondary | ICD-10-CM

## 2020-04-08 NOTE — Progress Notes (Signed)
64 year old black female history of end-stage DJD left knee and chronic pain comes in for preop evaluation.  States that left knee symptoms unchanged from previous visit.  She is wanting to proceed with left total knee replacement as scheduled.  We have received preop medical and cardiac clearances.  Patient has been instructed to stop her Xarelto 3 days preop.  Today history and physical performed.  On exam patient does have mild scattered bilateral expiratory wheezes.  No respiratory distress.  States that she did not use her inhaler today and is supposed to use this twice daily.  Surgeon procedure discussed in detail along with potential hospital stay.  All questions answered.

## 2020-04-15 ENCOUNTER — Encounter (HOSPITAL_COMMUNITY): Payer: Self-pay

## 2020-04-15 ENCOUNTER — Other Ambulatory Visit: Payer: Self-pay

## 2020-04-15 ENCOUNTER — Inpatient Hospital Stay (HOSPITAL_COMMUNITY)
Admission: RE | Admit: 2020-04-15 | Discharge: 2020-04-15 | Disposition: A | Discharge status: Home / Self Care | Payer: Medicare Other | Source: Ambulatory Visit

## 2020-04-15 HISTORY — DX: Personal history of other diseases of the digestive system: Z87.19

## 2020-04-15 HISTORY — DX: Sleep apnea, unspecified: G47.30

## 2020-04-15 HISTORY — DX: Unspecified osteoarthritis, unspecified site: M19.90

## 2020-04-15 NOTE — Progress Notes (Signed)
  [place], [facility] [address] [place] [address] [place] [address] Phone: [phone] Fax: [place] [address] [place]), [place] - [address] [place] ([place]) [place] [address] Phone: [phone] Fax: [phone] ---SDW----   Your procedure is scheduled on Wednesday, June 30th.  Report to [facility] Main Entrance "A" at 10:30 A.M., and check in at the Admitting office.  Call this number if you have problems the morning of surgery:  [phone]  Call [phone] if you have any questions prior to your surgery date Monday-Friday 8am-4pm   Remember:  Do not eat or drink after midnight the night before your surgery   Take these medicines the morning of surgery with A SIP OF WATER :            azaTHIOprine (IMURAN)             buPROPion (WELLBUTRIN XL)              busPIRone (BUSPAR)              fluticasone (FLONASE) Nasal Spray         levothyroxine (SYNTHROID, LEVOTHROID)             metoprolol succinate (TOPROL-XL)             omeprazole (PRILOSEC)              Use prednisoLONE acetate (PRED FORTE) eye drops  Take if  needed:  acetaminophen (TYLENOL)  budesonide-formoterol (SYMBICORT) ipratropium (ATROVENT) levalbuterol (XOPENEX HFA)   Follow your surgeons instructions regarding rivaroxaban (XARELTO).  As of today, STOP taking any Aspirin (unless otherwise instructed by your surgeon) and Aspirin containing products, Aleve, Naproxen, Ibuprofen, Motrin, Advil, Goody's, BC's, all herbal medications, fish oil, and all vitamins.                     Do not wear jewelry, make up, or nail polish            Do not wear lotions, powders, perfumes, or deodorant.            Do not shave 48 hours prior to surgery.              Do not bring valuables to the hospital.            [facility] is not responsible for any belongings or valuables.   Do NOT Smoke (Tobacco/Vapping) or drink  Alcohol 24 hours prior to your procedure If you use a CPAP at night, you may bring all equipment for your overnight stay.   Contacts, glasses, dentures or bridgework may not be worn into surgery.      For patients admitted to the hospital, discharge time will be determined by your treatment team.   Patients discharged the day of surgery will not be allowed to drive home, and someone needs to stay with them for 24 hours.  Special instructions:    Day of Surgery: Oral Hygiene is also important to reduce your risk of infection.  Remember - BRUSH YOUR TEETH THE MORNING OF SURGERY WITH YOUR REGULAR TOOTHPASTE Shower  Do not apply any deodorants/lotions.  Please wear clean clothes to the hospital/surgery center.     Please read over the following fact sheets that you were given.

## 2020-04-15 NOTE — Progress Notes (Signed)
----  SDW----  PCP - Dr. Precious Haws  Cardiologist - Dr. Esperanza Richters -records requested Nephrologist - Jannifer Hick -records to be requested  PPM/ICD - denies  EKG - tracing requested  Stress Test -  ECHO - 2012 (epic) Cardiac Cath - per patient one done years ago but no stent placed due to no blockages found  Sleep Study - Yes CPAP - Patient stated does not wear it all the time  DM: Type 2, diet controlled per patient A1C: 6.1: 03/05/2020  Blood Thinner Instructions: N/A Aspirin Instructions: N/A  ERAS Protcol - No orders  COVID TEST- Scheduled for 04/19/2020. Patient verbalized understanding of self-quarantine instructions, appointment time and place.  Anesthesia review: YES, cardiac history  Patient denies shortness of breath, fever, cough and chest pain at PAT appointment  All instructions explained to the patient, with a verbal understanding of the material. Patient agrees to go over the instructions while at home for a better understanding. Patient also instructed to self quarantine after being tested for COVID-19. The opportunity to ask questions was provided.

## 2020-04-15 NOTE — Pre-Procedure Instructions (Signed)
Jennifer Foley  04/15/2020     Your procedure is scheduled on Wednesday, June 30.  Report to [facility], Main Entrance or Entrance "A" at 10:30 AM              .Your surgery or procedure is scheduled to begin at 12:30 PM   Call this number if you have problems the morning of [phone]  This is the number for the Pre- Surgical Desk.    For any other questions, please call [phone], Monday - Friday 8 AM - 4 PM.   Remember:  Do not drink after midnight the evening before surgery.  You may drink clear liquids until 9:30 AM.  Clear liquids allowed are:    Water, Juice (non-citric and without pulp - diabetics please choose diet or no sugar options), Carbonated beverages - (diabetics please choose diet or no sugar options), Clear Tea, Black Coffee only (no creamer, milk or cream including half and half), Plain Jell-O only (diabetics please choose diet or no sugar options), Gatorade (diabetics please choose diet or no sugar options) and Plain Popsicles only    Take these medicines the morning of surgery with A SIP OF WATER :  azaTHIOprine (IMURAN)             buPROPion (WELLBUTRIN XL)              busPIRone (BUSPAR)              fluticasone (FLONASE) Nasal Spray         levothyroxine (SYNTHROID, LEVOTHROID)             metoprolol succinate (TOPROL-XL)             omeprazole (PRILOSEC)              potassium chloride if you can swallow with a sip of water             pravastatin (PRAVACHOL)             Use prednisoLONE acetate (PRED FORTE) eye drops  Take if  needed:  acetaminophen (TYLENOL)  budesonide-formoterol (SYMBICORT) - bring it with you ipratropium (ATROVENT)- bring it with you levalbuterol [facility] HFA) - bring it with you  Follow your surgeons instructions regarding rivaroxaban (XARELTO).   STOP taking Aspirin, Aspirin Products (Goody Powder, Excedrin Migraine), Ibuprofen (Advil), Naproxen (Aleve), Vitamins and Herbal Products (ie Fish  Oil).   Morning of Surgery:             Do not wear jewelry, make-up or nail polish.  Do not wear lotions, powders, or perfumes, or deodorant.  Do not shave 48 hours prior to surgery.    Do not bring valuables to the hospital.  [facility] is not responsible for any belongings or valuables.  Contacts, dentures or bridgework may not be worn into surgery.  Leave your suitcase in the car.  After surgery it may be brought to your room.  For patients admitted to the hospital, discharge time will be determined by your treatment team.  Patients discharged the day of surgery will not be allowed to drive home.   Special instructions:  [facility]- Preparing For Surgery  Before surgery, you can play an important role. Because skin is not sterile, your skin needs to be as free of germs as possible. You can reduce the number of germs on your skin by washing with CHG (chlorahexidine gluconate) Soap before surgery.  CHG is an antiseptic cleaner which  kills germs and bonds with the skin to continue killing germs even after washing.    Oral Hygiene is also important to reduce your risk of infection.  Remember - BRUSH YOUR TEETH THE MORNING OF SURGERY WITH YOUR REGULAR TOOTHPASTE  Please do not use if you have an allergy to CHG or antibacterial soaps. If your skin becomes reddened/irritated stop using the CHG.  Do not shave (including legs and underarms) for at least 48 hours prior to first CHG shower. It is OK to shave your face.  Please follow these instructions carefully.   1. Shower the NIGHT BEFORE SURGERY and the MORNING OF SURGERY with CHG.   2. If you chose to wash your hair, wash your hair first as usual with your normal shampoo.  3. After you shampoo, rinse your hair and body thoroughly to remove the shampoo.  4. Use CHG as you would any other liquid soap. You can apply CHG directly to the skin and wash gently with a scrungie or a clean washcloth.   5. Apply the CHG Soap to your body ONLY  FROM THE NECK DOWN.  Do not use on open wounds or open sores. Avoid contact with your eyes, ears, mouth and genitals (private parts). Wash Face and genitals (private parts)  with your normal soap.  6. Wash thoroughly, paying special attention to the area where your surgery will be performed.  7. Thoroughly rinse your body with warm water from the neck down.  8. DO NOT shower/wash with your normal soap after using and rinsing off the CHG Soap.  9. Pat yourself dry with a CLEAN TOWEL.  10. Wear CLEAN PAJAMAS to bed the night before surgery, wear comfortable clothes the morning of surgery  11. Place CLEAN SHEETS on your bed the night of your first shower and DO NOT SLEEP WITH PETS.  Day of Surgery: Shower as instructed above. Do not apply any deodorants/lotions.  Please wear clean clothes to the hospital/surgery center.   Remember to brush your teeth WITH YOUR REGULAR TOOTHPASTE.  Please read over the  fact sheets that you were given.

## 2020-04-16 ENCOUNTER — Encounter (HOSPITAL_COMMUNITY): Payer: Self-pay

## 2020-04-16 ENCOUNTER — Encounter (HOSPITAL_COMMUNITY)
Admission: RE | Admit: 2020-04-16 | Discharge: 2020-04-16 | Disposition: A | Discharge status: Home / Self Care | Payer: Medicare Other | Source: Ambulatory Visit | Attending: Orthopaedic Surgery | Admitting: Orthopaedic Surgery

## 2020-04-16 DIAGNOSIS — Z01812 Encounter for preprocedural laboratory examination: Secondary | ICD-10-CM | POA: Diagnosis not present

## 2020-04-16 HISTORY — DX: Hypothyroidism, unspecified: E03.9

## 2020-04-16 HISTORY — DX: Panuveitis, unspecified eye: H44.119

## 2020-04-16 LAB — BASIC METABOLIC PANEL
Anion gap: 10 (ref 5–15)
BUN: 19 mg/dL (ref 8–23)
CO2: 27 mmol/L (ref 22–32)
Calcium: 8.8 mg/dL — ABNORMAL LOW (ref 8.9–10.3)
Chloride: 104 mmol/L (ref 98–111)
Creatinine, Ser: 1.55 mg/dL — ABNORMAL HIGH (ref 0.44–1.00)
GFR calc Af Amer: 41 mL/min — ABNORMAL LOW (ref >60)
GFR calc non Af Amer: 35 mL/min — ABNORMAL LOW (ref >60)
Glucose, Bld: 110 mg/dL — ABNORMAL HIGH (ref 70–99)
Potassium: 4.3 mmol/L (ref 3.5–5.1)
Sodium: 141 mmol/L (ref 135–145)

## 2020-04-16 LAB — CBC
HCT: 38.6 % (ref 36.0–46.0)
Hemoglobin: 12.1 g/dL (ref 12.0–15.0)
MCH: 29.5 pg (ref 26.0–34.0)
MCHC: 31.3 g/dL (ref 30.0–36.0)
MCV: 94.1 fL (ref 80.0–100.0)
Platelets: 236 10*3/uL (ref 150–400)
RBC: 4.1 MIL/uL (ref 3.87–5.11)
RDW: 13.4 % (ref 11.5–15.5)
WBC: 7.3 10*3/uL (ref 4.0–10.5)
nRBC: 0 % (ref 0.0–0.2)

## 2020-04-16 LAB — SURGICAL PCR SCREEN
MRSA, PCR: NEGATIVE
Staphylococcus aureus: NEGATIVE

## 2020-04-16 LAB — GLUCOSE, CAPILLARY: Glucose-Capillary: 100 mg/dL — ABNORMAL HIGH (ref 70–99)

## 2020-04-16 NOTE — Progress Notes (Signed)
Pt stated that she was instructed to take last dose of Xarelto the morning of 04/18/20.

## 2020-04-16 NOTE — Progress Notes (Addendum)
Anesthesia Chart Review:  Case: [id number] Date/Time: 04/21/20 1215   Procedure: LEFT TOTAL KNEE ARTHROPLASTY (Left Knee)   Anesthesia type: Spinal   Pre-op diagnosis: left knee osteoarthritis   Location: [facility] OR ROOM 03 / [place]   Surgeons: [name], MD      DISCUSSION: Patient is a 64 year old female scheduled for the above procedure.  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 She had a false positive stress test in 12/2018 with 02/21/19 LHC showing normal coronaries. BMI is 40, consistent with morbid obesity.  PCP Dr. Luciana Axe cleared patient for surgery from a medical standpoint. Last visit 04/02/20.  Cardiologist Dr. Terrence Dupont cleared patient from a cardiac standpoint with permission to hold Xarelto for 3 days prior to surgery. Last office visit 02/24/20. She reported instructions to take last Xarelto on the morning of 04/18/20 (surgery is in the afternoon 04/21/20).    Last seen by nephrologist Dr. Johnney Ou in 06/2019. Preoperative Creatinine 1.55 appears stable.   Preoperative COVID-19 test is scheduled for 04/19/20. Anesthesia team to evaluate on the day of surgery.   VS: BP 128/75   Pulse 71   Temp 36.8 C (Oral)   Resp 18   Ht 5' (1.524 m)   Wt 92.9 kg   SpO2 100%   BMI 40.00 kg/m    PROVIDERS: Bartholome Bill, MD is PCP ([place]) - Charolette Forward, MD is primary cardiologist. Last visit 02/24/20. Previously she saw Mathis Bud, MD with [facility] (see Care Everywhere). She also saw EP cardiologist Curt Bears, Will, MD in 09/2017 for PAF and was in SR at that time.  Jannifer Hick, MD is nephrologist. Last visit 07/02/19. Creatinine had been stable at the  time with Creatinine ~ 1.4.  - Sherrilyn Rist, MD is pulmonologist (for OSA). Last visit 01/15/20. Dwana Melena, MDis ophthalmologist ([place]. Last visit 11/11/19.   LABS: Labs reviewed: Acceptable for surgery. A1c on 03/05/20 was 6.1% and TSH 11/06/19 2.370 ([place]) (all labs ordered are listed, but only abnormal results are displayed)  Labs Reviewed  BASIC METABOLIC PANEL - Abnormal; Notable for the following components:      Result Value   Glucose, Bld 110 (*)    Creatinine, Ser 1.55 (*)    Calcium 8.8 (*)    GFR calc non Af Amer 35 (*)    GFR calc Af Amer 41 (*)    All other components within normal limits  GLUCOSE, CAPILLARY - Abnormal; Notable for the following components:   Glucose-Capillary 100 (*)    All other components within normal limits  SURGICAL PCR SCREEN  CBC    Baseline Polysomnogram 10/09/17: IMPRESSION:  1. Obstructive Sleep Apnea (OSA)  2. Dysfunctions associated with sleep stages or arousal from  sleep  RECOMMENDATIONS:  This study demonstrates mild/borderline obstructive sleep  apnea with a total AHI of 5.8/hour and O2 nadir of 87%. Of note,  the absence of supine sleep and REM sleep during this study  likely underestimates her AHI and O2 nadir. Due to the overall  mild nature of the patient's sleep apnea, there are several  therapeutic avenues available...   IMAGES: MRI L-spine 08/03/19: IMPRESSION: Stable MRI appearance of the lumbar spine since 2019. Mild disc bulging and  mild to moderate facet hypertrophy with no spinal stenosis or convincing neural impingement.   EKG: July 2020 (Dr. Terrence Dupont): NSR. Non-specific T wave abnormality. Prolonged QT (QT 432/QTc 489).   CV: Cardiac cath 02/21/19 [facility]): Conclusions  Diagnostic Procedure Summary  Angiographycally normal coronary arteries  Elevated LVEDP  Continue medical management   Event monitor 02/06/19 [facility]): Baseline Rhythm: sinus rhythm  Rhythm  Findings:  1. Ventricular: no  2. Supraventricular: no  3. Bradyarrhythmias and Pauses: no, baseline sample showed a heart rate of 90 bpm sinus rhythm  Symptoms reported: chest pains, palpitations, heart flutter and skipped beat patient had a total of 10 stable events none of which were associated with any arrhythmia.  CONCLUSIONS:  1. normal event monitor.  2. arrhythmia:not present  3. Symptoms were reported  4. Symptoms were not correlated with arrhythmias    Nuclear stress test 01/08/19 [facility]): LV size:Normal  Scintigraphic Findings  There is a reversible anterior wall defect noted which is compatible with  ischemia.  The calculated EF is 63%.  There is gut uptake noted on rest images.    Echo 01/08/19: Conclusions  Summary  CPS of 08/25/11 there are changes  Non-specific mitral leaflet thickening with preserved mobility.  Mild mitral regurgitation.  Ejection fraction is visually estimated at 40-45%.  Moderate global LV hypokinesis.  Grade I diastolic dysfunction.     Past Medical History:  Diagnosis Date  . Anemia   . Arthritis    per patient, in left and right knee and both hands  . Atrial fibrillation ([place])   . Bilateral breast cysts   . CHF (congestive heart failure) ([place])   . Chronic kidney disease    stage 3  . Depression with anxiety   . DM (diabetes mellitus) ([place])   . Esophageal reflux   . History of hiatal hernia   . HTN (hypertension)   . Hyperlipemia   . Hyperthyroidism   . Hypothyroidism   . INR (international normal ratio) abnormal   . MRSA (methicillin resistant staph aureus) culture positive   . Pneumonia   . PTSD (post-traumatic stress disorder)   . Sleep apnea    per patient has CPAP, wears it at night sometimes    Past Surgical History:  Procedure Laterality Date  . ABDOMINAL HYSTERECTOMY    . BREAST BIOPSY    . CATARACT EXTRACTION  2018  . CESAREAN SECTION    . DENTAL SURGERY    . EYE SURGERY    . FOOT SURGERY    .  HERNIA REPAIR    . KNEE SURGERY    . TONSILLECTOMY AND ADENOIDECTOMY      MEDICATIONS: . acetaminophen (TYLENOL) 500 MG tablet  . Adalimumab 40 MG/0.8ML PNKT  . ammonium lactate (LAC-HYDRIN) 12 % lotion  . azaTHIOprine (IMURAN) 50 MG tablet  . budesonide-formoterol (SYMBICORT) 160-4.5 MCG/ACT inhaler  . buPROPion (WELLBUTRIN XL) 150 MG 24 hr tablet  . busPIRone (BUSPAR) 10 MG tablet  . fluticasone (FLONASE) 50 MCG/ACT nasal spray  . furosemide (LASIX) 40 MG tablet  . ipratropium (ATROVENT) 0.03 % nasal spray  . levalbuterol (XOPENEX HFA) 45 MCG/ACT inhaler  . levothyroxine (SYNTHROID, LEVOTHROID) 50 MCG tablet  . metoprolol succinate (TOPROL-XL) 25 MG 24 hr tablet  . omeprazole (PRILOSEC) 20 MG capsule  . potassium chloride (K-DUR,KLOR-CON) 10 MEQ tablet  . pravastatin (PRAVACHOL) 40 MG tablet  . prednisoLONE acetate (PRED FORTE) 1 % ophthalmic suspension  . rivaroxaban (XARELTO) 20 MG TABS tablet  No current facility-administered medications for this encounter.    Myra Gianotti, PA-C Surgical Short Stay/Anesthesiology [facility] Phone [phone] [facility] Phone [phone] 04/16/2020 5:17 PM

## 2020-04-16 NOTE — Anesthesia Preprocedure Evaluation (Addendum)
Anesthesia Evaluation  Patient identified by MRN, date of birth, ID band Patient awake    Reviewed: Allergy & Precautions, NPO status , Patient's Chart, lab work & pertinent test results, reviewed documented beta blocker date and time   Airway Mallampati: I  TM Distance: >3 FB Neck ROM: Full    Dental no notable dental hx. (+) Dental Advisory Given, Edentulous Upper, Edentulous Lower   Pulmonary sleep apnea and Continuous Positive Airway Pressure Ventilation , COPD,  COPD inhaler, former smoker,  Quit smoking 2005, 9 pack year history, currently using smokeless tobacco- has never used rescue inhaler  Mild/borderline OSA on sleep study 2018- Not always compliant with CPAP   Pulmonary exam normal breath sounds clear to auscultation       Cardiovascular hypertension, Pt. on medications and Pt. on home beta blockers +CHF  Normal cardiovascular exam+ dysrhythmias Atrial Fibrillation + Valvular Problems/Murmurs (mild MR) MR  Rhythm:Regular Rate:Normal  Cardiac cath 02/21/19 (for false + NST, [facility]): Angiographycally normal coronary arteries on cath Elevated LVEDP  Continue medical management  Event monitor 02/06/19 [facility]): CONCLUSIONS:  1. normal event monitor.  2. Symptoms were not correlated with arrhythmias   Echo 01/08/19: Summary  CPS of 08/25/11 there are changes  Non-specific mitral leaflet thickening with preserved mobility.  Mild mitral regurgitation.  Ejection fraction is visually estimated at 40-45%.  Moderate global LV hypokinesis.  Grade I diastolic dysfunction.    PAF- on xarelto- last dose: 6/27   Neuro/Psych PSYCHIATRIC DISORDERS Anxiety Depression PTSDnegative neurological ROS     GI/Hepatic Neg liver ROS, hiatal hernia, GERD  Medicated and Controlled,  Endo/Other  diabetes, Well Controlled, Type 2Hypothyroidism Morbid obesityBMI 40 T2DM- no meds  Renal/GU Renal InsufficiencyRenal diseaseCr 1.55,  CKD 3  negative genitourinary   Musculoskeletal  (+) Arthritis , Osteoarthritis,  L knee OA   Abdominal (+) + obese,   Peds  Hematology negative hematology ROS (+) hct 38.6, plt 236   Anesthesia Other Findings   Reproductive/Obstetrics negative OB ROS S/p hysterectomy                         Anesthesia Physical Anesthesia Plan  ASA: III  Anesthesia Plan: Spinal, MAC and Regional   Post-op Pain Management:  Regional for Post-op pain   Induction:   PONV Risk Score and Plan: 2 and Propofol infusion, TIVA and Treatment may vary due to age or medical condition  Airway Management Planned: Natural Airway and Nasal Cannula  Additional Equipment: None  Intra-op Plan:   Post-operative Plan:   Informed Consent: I have reviewed the patients History and Physical, chart, labs and discussed the procedure including the risks, benefits and alternatives for the proposed anesthesia with the patient or authorized representative who has indicated his/her understanding and acceptance.       Plan Discussed with: CRNA  Anesthesia Plan Comments:       Anesthesia Quick Evaluation

## 2020-04-16 NOTE — Progress Notes (Signed)
Pt denies SOB and chest pain. VS documented and labs drawn.

## 2020-04-19 ENCOUNTER — Other Ambulatory Visit (HOSPITAL_COMMUNITY)
Admission: RE | Admit: 2020-04-19 | Discharge: 2020-04-19 | Disposition: A | Discharge status: Home / Self Care | Payer: Medicare Other | Source: Ambulatory Visit | Attending: Orthopaedic Surgery | Admitting: Orthopaedic Surgery

## 2020-04-19 DIAGNOSIS — Z20822 Contact with and (suspected) exposure to covid-19: Secondary | ICD-10-CM | POA: Insufficient documentation

## 2020-04-19 DIAGNOSIS — Z01812 Encounter for preprocedural laboratory examination: Secondary | ICD-10-CM | POA: Insufficient documentation

## 2020-04-19 LAB — SARS CORONAVIRUS 2 (TAT 6-24 HRS): SARS Coronavirus 2: NEGATIVE

## 2020-04-21 ENCOUNTER — Inpatient Hospital Stay (HOSPITAL_COMMUNITY)
Admission: RE | Admit: 2020-04-21 | Discharge: 2020-04-25 | DRG: 470 | Disposition: A | Discharge status: Skilled Nursing Facility | Payer: Medicare Other | Attending: Orthopaedic Surgery | Admitting: Orthopaedic Surgery

## 2020-04-21 ENCOUNTER — Encounter (HOSPITAL_COMMUNITY): Payer: Self-pay | Admitting: Orthopaedic Surgery

## 2020-04-21 ENCOUNTER — Ambulatory Visit (HOSPITAL_COMMUNITY): Payer: Medicare Other | Admitting: Anesthesiology

## 2020-04-21 ENCOUNTER — Ambulatory Visit (HOSPITAL_COMMUNITY): Payer: Medicare Other | Admitting: Vascular Surgery

## 2020-04-21 ENCOUNTER — Inpatient Hospital Stay (HOSPITAL_COMMUNITY): Payer: Medicare Other

## 2020-04-21 ENCOUNTER — Other Ambulatory Visit: Payer: Self-pay

## 2020-04-21 ENCOUNTER — Encounter (HOSPITAL_COMMUNITY)
Admission: RE | Disposition: A | Discharge status: Skilled Nursing Facility | Payer: Self-pay | Source: Home / Self Care | Attending: Orthopaedic Surgery

## 2020-04-21 DIAGNOSIS — K219 Gastro-esophageal reflux disease without esophagitis: Secondary | ICD-10-CM | POA: Diagnosis present

## 2020-04-21 DIAGNOSIS — I4891 Unspecified atrial fibrillation: Secondary | ICD-10-CM | POA: Diagnosis present

## 2020-04-21 DIAGNOSIS — E039 Hypothyroidism, unspecified: Secondary | ICD-10-CM | POA: Diagnosis present

## 2020-04-21 DIAGNOSIS — E785 Hyperlipidemia, unspecified: Secondary | ICD-10-CM | POA: Diagnosis present

## 2020-04-21 DIAGNOSIS — Z09 Encounter for follow-up examination after completed treatment for conditions other than malignant neoplasm: Secondary | ICD-10-CM

## 2020-04-21 DIAGNOSIS — I130 Hypertensive heart and chronic kidney disease with heart failure and stage 1 through stage 4 chronic kidney disease, or unspecified chronic kidney disease: Secondary | ICD-10-CM | POA: Diagnosis present

## 2020-04-21 DIAGNOSIS — Z20822 Contact with and (suspected) exposure to covid-19: Secondary | ICD-10-CM | POA: Diagnosis present

## 2020-04-21 DIAGNOSIS — F17220 Nicotine dependence, chewing tobacco, uncomplicated: Secondary | ICD-10-CM | POA: Diagnosis present

## 2020-04-21 DIAGNOSIS — M1712 Unilateral primary osteoarthritis, left knee: Secondary | ICD-10-CM | POA: Diagnosis present

## 2020-04-21 DIAGNOSIS — N183 Chronic kidney disease, stage 3 unspecified: Secondary | ICD-10-CM | POA: Diagnosis present

## 2020-04-21 DIAGNOSIS — Z90710 Acquired absence of both cervix and uterus: Secondary | ICD-10-CM

## 2020-04-21 DIAGNOSIS — F4310 Post-traumatic stress disorder, unspecified: Secondary | ICD-10-CM | POA: Diagnosis present

## 2020-04-21 DIAGNOSIS — Z96659 Presence of unspecified artificial knee joint: Secondary | ICD-10-CM

## 2020-04-21 DIAGNOSIS — I509 Heart failure, unspecified: Secondary | ICD-10-CM | POA: Diagnosis present

## 2020-04-21 DIAGNOSIS — E1122 Type 2 diabetes mellitus with diabetic chronic kidney disease: Secondary | ICD-10-CM | POA: Diagnosis present

## 2020-04-21 DIAGNOSIS — Z6839 Body mass index (BMI) 39.0-39.9, adult: Secondary | ICD-10-CM

## 2020-04-21 HISTORY — PX: TOTAL KNEE ARTHROPLASTY: SHX125

## 2020-04-21 LAB — GLUCOSE, CAPILLARY
Glucose-Capillary: 119 mg/dL — ABNORMAL HIGH (ref 70–99)
Glucose-Capillary: 104 mg/dL — ABNORMAL HIGH (ref 70–99)
Glucose-Capillary: 114 mg/dL — ABNORMAL HIGH (ref 70–99)
Glucose-Capillary: 143 mg/dL — ABNORMAL HIGH (ref 70–99)

## 2020-04-21 LAB — PROTIME-INR
INR: 1 (ref 0.8–1.2)
Prothrombin Time: 13.2 seconds (ref 11.4–15.2)

## 2020-04-21 SURGERY — ARTHROPLASTY, KNEE, TOTAL
Anesthesia: Monitor Anesthesia Care | Site: Knee | Laterality: Left

## 2020-04-21 MED ORDER — HYDROMORPHONE HCL 1 MG/ML IJ SOLN
0.5000 mg | INTRAMUSCULAR | Status: DC | PRN
  Administered 2020-04-21: 0.5 mg via INTRAVENOUS
  Filled 2020-04-21: qty 1

## 2020-04-21 MED ORDER — ORAL CARE MOUTH RINSE: 15.0000 mL | Freq: Once | OROMUCOSAL | Status: AC

## 2020-04-21 MED ORDER — LEVALBUTEROL HCL 0.63 MG/3ML IN NEBU: 0.6300 mg | INHALATION_SOLUTION | RESPIRATORY_TRACT | Status: DC | PRN

## 2020-04-21 MED ORDER — METHOCARBAMOL 1000 MG/10ML IJ SOLN
500.0000 mg | Freq: Four times a day (QID) | INTRAVENOUS | Status: DC | PRN
  Filled 2020-04-21: qty 5

## 2020-04-21 MED ORDER — BUPROPION HCL ER (XL) 150 MG PO TB24
150.0000 mg | ORAL_TABLET | Freq: Every day | ORAL | Status: DC
  Administered 2020-04-22 – 2020-04-25 (×4): 150 mg via ORAL
  Filled 2020-04-21 (×4): qty 1

## 2020-04-21 MED ORDER — OXYCODONE HCL 5 MG PO TABS
5.0000 mg | ORAL_TABLET | ORAL | Status: DC | PRN
  Administered 2020-04-21 – 2020-04-22 (×5): 5 mg via ORAL
  Filled 2020-04-21 (×5): qty 1

## 2020-04-21 MED ORDER — HYDROMORPHONE HCL 1 MG/ML IJ SOLN
INTRAMUSCULAR | Status: AC
  Filled 2020-04-21: qty 1

## 2020-04-21 MED ORDER — PROPOFOL 10 MG/ML IV BOLUS
INTRAVENOUS | Status: DC | PRN
  Administered 2020-04-21: 150 mg via INTRAVENOUS

## 2020-04-21 MED ORDER — ONDANSETRON HCL 4 MG/2ML IJ SOLN
INTRAMUSCULAR | Status: DC | PRN
  Administered 2020-04-21: 4 mg via INTRAVENOUS

## 2020-04-21 MED ORDER — ONDANSETRON HCL 4 MG/2ML IJ SOLN: 4.0000 mg | Freq: Four times a day (QID) | INTRAMUSCULAR | Status: DC | PRN

## 2020-04-21 MED ORDER — ONDANSETRON HCL 4 MG PO TABS: 4.0000 mg | ORAL_TABLET | Freq: Four times a day (QID) | ORAL | Status: DC | PRN

## 2020-04-21 MED ORDER — ROCURONIUM BROMIDE 100 MG/10ML IV SOLN
INTRAVENOUS | Status: DC | PRN
Start: 2020-04-21 — End: 2020-04-21
  Administered 2020-04-21: 100 mg via INTRAVENOUS

## 2020-04-21 MED ORDER — EPHEDRINE SULFATE 50 MG/ML IJ SOLN
INTRAMUSCULAR | Status: DC | PRN
  Administered 2020-04-21: 5 mg via INTRAVENOUS

## 2020-04-21 MED ORDER — AZATHIOPRINE 50 MG PO TABS
50.0000 mg | ORAL_TABLET | Freq: Two times a day (BID) | ORAL | Status: DC
  Administered 2020-04-21 – 2020-04-25 (×8): 50 mg via ORAL
  Filled 2020-04-21 (×11): qty 1

## 2020-04-21 MED ORDER — POTASSIUM CHLORIDE CRYS ER 10 MEQ PO TBCR
10.0000 meq | EXTENDED_RELEASE_TABLET | Freq: Every day | ORAL | Status: DC
  Administered 2020-04-21 – 2020-04-25 (×5): 10 meq via ORAL
  Filled 2020-04-21 (×5): qty 1

## 2020-04-21 MED ORDER — ONDANSETRON HCL 4 MG/2ML IJ SOLN
INTRAMUSCULAR | Status: AC
  Filled 2020-04-21: qty 2

## 2020-04-21 MED ORDER — FENTANYL CITRATE (PF) 100 MCG/2ML IJ SOLN
INTRAMUSCULAR | Status: AC
  Administered 2020-04-21: 50 ug via INTRAVENOUS
  Filled 2020-04-21: qty 2

## 2020-04-21 MED ORDER — DEXAMETHASONE SODIUM PHOSPHATE 10 MG/ML IJ SOLN
INTRAMUSCULAR | Status: DC | PRN
Start: 2020-04-21 — End: 2020-04-21
  Administered 2020-04-21 (×2): 5 mg

## 2020-04-21 MED ORDER — ROPIVACAINE HCL 5 MG/ML IJ SOLN
INTRAMUSCULAR | Status: DC | PRN
Start: 2020-04-21 — End: 2020-04-21
  Administered 2020-04-21: 20 mL via PERINEURAL

## 2020-04-21 MED ORDER — DEXAMETHASONE SODIUM PHOSPHATE 10 MG/ML IJ SOLN
INTRAMUSCULAR | Status: AC
  Filled 2020-04-21: qty 1

## 2020-04-21 MED ORDER — PHENYLEPHRINE HCL (PRESSORS) 10 MG/ML IV SOLN
INTRAVENOUS | Status: DC | PRN
  Administered 2020-04-21: 120 ug via INTRAVENOUS

## 2020-04-21 MED ORDER — METOPROLOL SUCCINATE ER 25 MG PO TB24
25.0000 mg | ORAL_TABLET | Freq: Every day | ORAL | Status: DC
  Administered 2020-04-22 – 2020-04-25 (×4): 25 mg via ORAL
  Filled 2020-04-21 (×4): qty 1

## 2020-04-21 MED ORDER — BUPIVACAINE LIPOSOME 1.3 % IJ SUSP
INTRAMUSCULAR | Status: DC | PRN
  Administered 2020-04-21: 20 mL

## 2020-04-21 MED ORDER — LACTATED RINGERS IV SOLN: INTRAVENOUS | Status: DC

## 2020-04-21 MED ORDER — TRANEXAMIC ACID-NACL 1000-0.7 MG/100ML-% IV SOLN
INTRAVENOUS | Status: AC
  Filled 2020-04-21: qty 100

## 2020-04-21 MED ORDER — OXYCODONE HCL 5 MG/5ML PO SOLN: 5.0000 mg | Freq: Once | ORAL | Status: AC | PRN

## 2020-04-21 MED ORDER — TRANEXAMIC ACID 1000 MG/10ML IV SOLN
2000.0000 mg | Freq: Once | INTRAVENOUS | Status: DC
  Filled 2020-04-21: qty 20

## 2020-04-21 MED ORDER — IPRATROPIUM BROMIDE 0.06 % NA SOLN
1.0000 | Freq: Every day | NASAL | Status: DC | PRN
  Filled 2020-04-21: qty 15

## 2020-04-21 MED ORDER — ADALIMUMAB 40 MG/0.8ML ~~LOC~~ AJKT: 40.0000 mg | AUTO-INJECTOR | SUBCUTANEOUS | Status: DC

## 2020-04-21 MED ORDER — ROCURONIUM BROMIDE 10 MG/ML (PF) SYRINGE
PREFILLED_SYRINGE | INTRAVENOUS | Status: AC
  Filled 2020-04-21: qty 10

## 2020-04-21 MED ORDER — FENTANYL CITRATE (PF) 100 MCG/2ML IJ SOLN
INTRAMUSCULAR | Status: DC | PRN
  Administered 2020-04-21: 50 ug via INTRAVENOUS
  Administered 2020-04-21: 25 ug via INTRAVENOUS
  Administered 2020-04-21: 100 ug via INTRAVENOUS

## 2020-04-21 MED ORDER — FLUTICASONE PROPIONATE 50 MCG/ACT NA SUSP
2.0000 | Freq: Every day | NASAL | Status: DC
  Administered 2020-04-22 – 2020-04-23 (×2): 2 via NASAL
  Filled 2020-04-21: qty 16

## 2020-04-21 MED ORDER — ACETAMINOPHEN 500 MG PO TABS
1000.0000 mg | ORAL_TABLET | Freq: Once | ORAL | Status: AC
  Administered 2020-04-21: 1000 mg via ORAL
  Filled 2020-04-21: qty 2

## 2020-04-21 MED ORDER — OXYCODONE HCL 5 MG PO TABS
ORAL_TABLET | ORAL | Status: AC
  Filled 2020-04-21: qty 1

## 2020-04-21 MED ORDER — SODIUM CHLORIDE 0.9 % IV SOLN: INTRAVENOUS | Status: DC

## 2020-04-21 MED ORDER — RIVAROXABAN 20 MG PO TABS
20.0000 mg | ORAL_TABLET | Freq: Every day | ORAL | Status: DC
  Administered 2020-04-22 – 2020-04-25 (×4): 20 mg via ORAL
  Filled 2020-04-21 (×4): qty 1

## 2020-04-21 MED ORDER — PHENOL 1.4 % MT LIQD: 1.0000 | OROMUCOSAL | Status: DC | PRN

## 2020-04-21 MED ORDER — METHOCARBAMOL 500 MG PO TABS
500.0000 mg | ORAL_TABLET | Freq: Four times a day (QID) | ORAL | Status: DC | PRN
  Administered 2020-04-22 – 2020-04-25 (×10): 500 mg via ORAL
  Filled 2020-04-21 (×10): qty 1

## 2020-04-21 MED ORDER — PHENYLEPHRINE 40 MCG/ML (10ML) SYRINGE FOR IV PUSH (FOR BLOOD PRESSURE SUPPORT)
PREFILLED_SYRINGE | INTRAVENOUS | Status: AC
  Filled 2020-04-21: qty 20

## 2020-04-21 MED ORDER — DOCUSATE SODIUM 100 MG PO CAPS
100.0000 mg | ORAL_CAPSULE | Freq: Two times a day (BID) | ORAL | Status: DC
  Administered 2020-04-21 – 2020-04-25 (×8): 100 mg via ORAL
  Filled 2020-04-21 (×8): qty 1

## 2020-04-21 MED ORDER — FENTANYL CITRATE (PF) 100 MCG/2ML IJ SOLN: 50.0000 ug | Freq: Once | INTRAMUSCULAR | Status: AC

## 2020-04-21 MED ORDER — BUPIVACAINE HCL (PF) 0.25 % IJ SOLN
INTRAMUSCULAR | Status: AC
  Filled 2020-04-21: qty 30

## 2020-04-21 MED ORDER — EPHEDRINE 5 MG/ML INJ
INTRAVENOUS | Status: AC
  Filled 2020-04-21: qty 10

## 2020-04-21 MED ORDER — PROMETHAZINE HCL 25 MG/ML IJ SOLN: 6.2500 mg | INTRAMUSCULAR | Status: DC | PRN

## 2020-04-21 MED ORDER — MIDAZOLAM HCL 2 MG/2ML IJ SOLN: 2.0000 mg | Freq: Once | INTRAMUSCULAR | Status: AC

## 2020-04-21 MED ORDER — OXYCODONE HCL 5 MG PO TABS
5.0000 mg | ORAL_TABLET | Freq: Once | ORAL | Status: AC | PRN
  Administered 2020-04-21: 5 mg via ORAL

## 2020-04-21 MED ORDER — FENTANYL CITRATE (PF) 250 MCG/5ML IJ SOLN
INTRAMUSCULAR | Status: AC
  Filled 2020-04-21: qty 5

## 2020-04-21 MED ORDER — SUGAMMADEX SODIUM 200 MG/2ML IV SOLN
INTRAVENOUS | Status: DC | PRN
Start: 2020-04-21 — End: 2020-04-21
  Administered 2020-04-21: 400 mg via INTRAVENOUS

## 2020-04-21 MED ORDER — LIDOCAINE 2% (20 MG/ML) 5 ML SYRINGE
INTRAMUSCULAR | Status: AC
  Filled 2020-04-21: qty 5

## 2020-04-21 MED ORDER — FUROSEMIDE 40 MG PO TABS
40.0000 mg | ORAL_TABLET | Freq: Every day | ORAL | Status: DC
  Administered 2020-04-21 – 2020-04-25 (×5): 40 mg via ORAL
  Filled 2020-04-21 (×5): qty 1

## 2020-04-21 MED ORDER — ACETAMINOPHEN 325 MG PO TABS
325.0000 mg | ORAL_TABLET | Freq: Four times a day (QID) | ORAL | Status: DC | PRN
  Administered 2020-04-22 – 2020-04-25 (×3): 650 mg via ORAL
  Filled 2020-04-21 (×3): qty 2

## 2020-04-21 MED ORDER — MIDAZOLAM HCL 2 MG/2ML IJ SOLN
INTRAMUSCULAR | Status: AC
  Administered 2020-04-21: 2 mg via INTRAVENOUS
  Filled 2020-04-21: qty 2

## 2020-04-21 MED ORDER — PREDNISOLONE ACETATE 1 % OP SUSP
1.0000 [drp] | Freq: Every day | OPHTHALMIC | Status: DC
  Administered 2020-04-22 – 2020-04-25 (×4): 1 [drp] via OPHTHALMIC
  Filled 2020-04-21: qty 5

## 2020-04-21 MED ORDER — PROPOFOL 10 MG/ML IV BOLUS
INTRAVENOUS | Status: AC
  Filled 2020-04-21: qty 20

## 2020-04-21 MED ORDER — METOCLOPRAMIDE HCL 5 MG/ML IJ SOLN: 5.0000 mg | Freq: Three times a day (TID) | INTRAMUSCULAR | Status: DC | PRN

## 2020-04-21 MED ORDER — LIDOCAINE HCL (CARDIAC) PF 100 MG/5ML IV SOSY
PREFILLED_SYRINGE | INTRAVENOUS | Status: DC | PRN
  Administered 2020-04-21: 40 mg via INTRAVENOUS

## 2020-04-21 MED ORDER — MENTHOL 3 MG MT LOZG: 1.0000 | LOZENGE | OROMUCOSAL | Status: DC | PRN

## 2020-04-21 MED ORDER — CEFAZOLIN SODIUM-DEXTROSE 2-4 GM/100ML-% IV SOLN
INTRAVENOUS | Status: AC
  Filled 2020-04-21: qty 100

## 2020-04-21 MED ORDER — HYDROMORPHONE HCL 1 MG/ML IJ SOLN
0.2500 mg | INTRAMUSCULAR | Status: DC | PRN
  Administered 2020-04-21 (×4): 0.5 mg via INTRAVENOUS

## 2020-04-21 MED ORDER — TRANEXAMIC ACID-NACL 1000-0.7 MG/100ML-% IV SOLN
INTRAVENOUS | Status: DC | PRN
Start: 2020-04-21 — End: 2020-04-21
  Administered 2020-04-21: 1000 mg via INTRAVENOUS

## 2020-04-21 MED ORDER — PRAVASTATIN SODIUM 40 MG PO TABS
40.0000 mg | ORAL_TABLET | Freq: Every day | ORAL | Status: DC
  Administered 2020-04-21 – 2020-04-25 (×5): 40 mg via ORAL
  Filled 2020-04-21 (×5): qty 1

## 2020-04-21 MED ORDER — MOMETASONE FURO-FORMOTEROL FUM 200-5 MCG/ACT IN AERO
2.0000 | INHALATION_SPRAY | Freq: Two times a day (BID) | RESPIRATORY_TRACT | Status: DC
  Administered 2020-04-22 – 2020-04-25 (×5): 2 via RESPIRATORY_TRACT
  Filled 2020-04-21: qty 8.8

## 2020-04-21 MED ORDER — BUPIVACAINE HCL (PF) 0.25 % IJ SOLN
INTRAMUSCULAR | Status: DC | PRN
  Administered 2020-04-21: 20 mL

## 2020-04-21 MED ORDER — METOCLOPRAMIDE HCL 5 MG PO TABS: 5.0000 mg | ORAL_TABLET | Freq: Three times a day (TID) | ORAL | Status: DC | PRN

## 2020-04-21 MED ORDER — LEVOTHYROXINE SODIUM 50 MCG PO TABS
50.0000 ug | ORAL_TABLET | Freq: Every day | ORAL | Status: DC
  Administered 2020-04-22 – 2020-04-25 (×4): 50 ug via ORAL
  Filled 2020-04-21 (×4): qty 1

## 2020-04-21 MED ORDER — CHLORHEXIDINE GLUCONATE 0.12 % MT SOLN
15.0000 mL | Freq: Once | OROMUCOSAL | Status: AC
  Administered 2020-04-21: 15 mL via OROMUCOSAL
  Filled 2020-04-21: qty 15

## 2020-04-21 MED ORDER — BUSPIRONE HCL 10 MG PO TABS
10.0000 mg | ORAL_TABLET | Freq: Two times a day (BID) | ORAL | Status: DC
  Administered 2020-04-21 – 2020-04-25 (×8): 10 mg via ORAL
  Filled 2020-04-21 (×8): qty 1

## 2020-04-21 MED ORDER — PANTOPRAZOLE SODIUM 40 MG PO TBEC
40.0000 mg | DELAYED_RELEASE_TABLET | Freq: Every day | ORAL | Status: DC
  Administered 2020-04-22 – 2020-04-25 (×4): 40 mg via ORAL
  Filled 2020-04-21 (×4): qty 1

## 2020-04-21 MED ORDER — SODIUM CHLORIDE 0.9 % IR SOLN
Status: DC | PRN
  Administered 2020-04-21: 3000 mL

## 2020-04-21 MED ORDER — CEFAZOLIN SODIUM-DEXTROSE 2-4 GM/100ML-% IV SOLN
2.0000 g | Freq: Once | INTRAVENOUS | Status: AC
  Administered 2020-04-21: 2 mg via INTRAVENOUS

## 2020-04-21 SURGICAL SUPPLY — 66 items
ATTUNE MED DOME PAT 32 KNEE (Knees) ×2 IMPLANT
ATTUNE PS FEM LT SZ 3 CEM KNEE (Femur) ×2 IMPLANT
ATTUNE PSRP INSR SZ3 5 KNEE (Insert) ×2 IMPLANT
BANDAGE ESMARK 6X9 LF (GAUZE/BANDAGES/DRESSINGS) ×1 IMPLANT
BASE TIBIAL ROT PLAT SZ 3 KNEE (Knees) ×1 IMPLANT
BLADE SAGITTAL 25.0X1.19X90 (BLADE) ×2 IMPLANT
BLADE SAW SGTL 13X75X1.27 (BLADE) ×2 IMPLANT
BNDG ELASTIC 4X5.8 VLCR STR LF (GAUZE/BANDAGES/DRESSINGS) ×2 IMPLANT
BNDG ELASTIC 6X5.8 VLCR STR LF (GAUZE/BANDAGES/DRESSINGS) ×2 IMPLANT
BNDG ESMARK 6X9 LF (GAUZE/BANDAGES/DRESSINGS) ×2
BOWL SMART MIX CTS (DISPOSABLE) ×2 IMPLANT
CEMENT HV SMART SET (Cement) ×4 IMPLANT
COVER SURGICAL LIGHT HANDLE (MISCELLANEOUS) ×2 IMPLANT
COVER WAND RF STERILE (DRAPES) ×2 IMPLANT
CUFF TOURN SGL QUICK 34 (TOURNIQUET CUFF) ×1
CUFF TOURN SGL QUICK 42 (TOURNIQUET CUFF) IMPLANT
CUFF TRNQT CYL 34X4.125X (TOURNIQUET CUFF) ×1 IMPLANT
DRAPE ORTHO SPLIT 77X108 STRL (DRAPES) ×2
DRAPE SURG ORHT 6 SPLT 77X108 (DRAPES) ×2 IMPLANT
DRAPE U-SHAPE 47X51 STRL (DRAPES) ×2 IMPLANT
DRSG PAD ABDOMINAL 8X10 ST (GAUZE/BANDAGES/DRESSINGS) ×2 IMPLANT
ELECT REM PT RETURN 9FT ADLT (ELECTROSURGICAL) ×2
ELECTRODE REM PT RTRN 9FT ADLT (ELECTROSURGICAL) ×1 IMPLANT
EVACUATOR 1/8 PVC DRAIN (DRAIN) IMPLANT
FACESHIELD WRAPAROUND (MASK) ×4 IMPLANT
GAUZE SPONGE 4X4 12PLY STRL (GAUZE/BANDAGES/DRESSINGS) ×2 IMPLANT
GAUZE XEROFORM 5X9 LF (GAUZE/BANDAGES/DRESSINGS) ×2 IMPLANT
GLOVE BIOGEL PI IND STRL 8 (GLOVE) ×2 IMPLANT
GLOVE BIOGEL PI INDICATOR 8 (GLOVE) ×2
GLOVE ORTHO TXT STRL SZ7.5 (GLOVE) ×4 IMPLANT
GOWN STRL REUS W/ TWL LRG LVL3 (GOWN DISPOSABLE) ×1 IMPLANT
GOWN STRL REUS W/ TWL XL LVL3 (GOWN DISPOSABLE) ×1 IMPLANT
GOWN STRL REUS W/TWL 2XL LVL3 (GOWN DISPOSABLE) ×2 IMPLANT
GOWN STRL REUS W/TWL LRG LVL3 (GOWN DISPOSABLE) ×1
GOWN STRL REUS W/TWL XL LVL3 (GOWN DISPOSABLE) ×1
HANDPIECE INTERPULSE COAX TIP (DISPOSABLE) ×1
IMMOBILIZER KNEE 22 UNIV (SOFTGOODS) ×2 IMPLANT
KIT BASIN OR (CUSTOM PROCEDURE TRAY) ×2 IMPLANT
KIT TURNOVER KIT B (KITS) ×2 IMPLANT
MANIFOLD NEPTUNE II (INSTRUMENTS) ×2 IMPLANT
MARKER SKIN DUAL TIP RULER LAB (MISCELLANEOUS) ×2 IMPLANT
NEEDLE 18GX1X1/2 (RX/OR ONLY) (NEEDLE) ×4 IMPLANT
NS IRRIG 1000ML POUR BTL (IV SOLUTION) ×2 IMPLANT
PACK TOTAL JOINT (CUSTOM PROCEDURE TRAY) ×2 IMPLANT
PAD ARMBOARD 7.5X6 YLW CONV (MISCELLANEOUS) ×4 IMPLANT
PAD CAST 4YDX4 CTTN HI CHSV (CAST SUPPLIES) ×1 IMPLANT
PADDING CAST COTTON 4X4 STRL (CAST SUPPLIES) ×1
PADDING CAST SYN 6 (CAST SUPPLIES) ×1
PADDING CAST SYNTHETIC 6X4 NS (CAST SUPPLIES) ×1 IMPLANT
PIN DRILL FIX HALF THREAD (BIT) ×2 IMPLANT
PIN STEINMAN FIXATION KNEE (PIN) ×2 IMPLANT
SET HNDPC FAN SPRY TIP SCT (DISPOSABLE) ×1 IMPLANT
STAPLER VISISTAT 35W (STAPLE) ×2 IMPLANT
SUCTION FRAZIER HANDLE 10FR (MISCELLANEOUS) ×1
SUCTION TUBE FRAZIER 10FR DISP (MISCELLANEOUS) ×1 IMPLANT
SUT VIC AB 0 CT1 27 (SUTURE) ×1
SUT VIC AB 0 CT1 27XBRD ANBCTR (SUTURE) ×1 IMPLANT
SUT VIC AB 1 CTX 36 (SUTURE) ×3
SUT VIC AB 1 CTX36XBRD ANBCTR (SUTURE) ×3 IMPLANT
SUT VIC AB 2-0 CT1 27 (SUTURE) ×2
SUT VIC AB 2-0 CT1 TAPERPNT 27 (SUTURE) ×2 IMPLANT
SUT VIC AB 3-0 X1 27 (SUTURE) ×2 IMPLANT
SYR CONTROL 10ML LL (SYRINGE) ×4 IMPLANT
TIBIAL BASE ROT PLAT SZ 3 KNEE (Knees) ×2 IMPLANT
TOWEL GREEN STERILE (TOWEL DISPOSABLE) ×2 IMPLANT
TOWEL GREEN STERILE FF (TOWEL DISPOSABLE) ×2 IMPLANT

## 2020-04-21 NOTE — Progress Notes (Signed)
Orthopedic Tech Progress Note Patient Details:  Linah Klapper [birth year] [phone] RN called requesting CPM and bone foam. Patient could only handle 80 degrees of passive motion at this time. Bone foam was left at bedside.  CPM Left Knee Left Knee Flexion (Degrees): 0 Left Knee Extension (Degrees): 80 Additional Comments: Added ice  Post Interventions Patient Tolerated: Other (comment) Instructions Provided: Care of device Ortho Devices Type of Ortho Device: Bone foam zero knee Ortho Device/Splint Interventions: Ordered   Post Interventions Patient Tolerated: Other (comment) Instructions Provided: Care of device   Petra Kuba 04/21/2020, 3:53 PM

## 2020-04-21 NOTE — Op Note (Signed)
Preop diagnosis: Left knee primary osteoarthritis  Postop diagnosis: Same  Procedure: Left total knee arthroplasty  Surgeon Rodell Perna, MD  Assistant Benjiman Core, PA-C medically necessary and present for the entire procedure  Anesthesia: Attempted spinal converted to general +20 Exparel and 20 cc Marcaine at end of case  Implants:Depuy Attune size 3 femur size 3 tibia size 3 5 mm rotating platform.  32 mm 3 peg patella.  Procedure: After unsuccessful attempted spinal patient underwent general anesthesia proximal thigh tourniquet lateral post heel bump DuraPrep usual total knee sheets and drapes.  Sterile skin marker Betadine Steri-Drape.  Leg was wrapped in Esmarch tourniquet inflated.  Timeout procedure was completed TXA was given by anesthesia IV.  Midline incision was made tourniquet was set at 350 for a total of approximately 56 minutes.  Medial parapatellar incision was made.  There was eburnated bone patellofemoral joint and medial compartment.  Meniscus remnants were resected.  10 mm resected off the tibia with oscillating saw.  Intramedullary hole drilled in the femur distal cut 10 mm.  None was taken off the tibia initially and then an additional 2 mm resected to get the 5 mm spacer block line full extension.  Chamfer cuts were made on the femur box cut tibial keel preparation.  Trials gave full extension good collateral balance in flexion extension.  Pulse lavage back to mixing of cement cementing of the tibia followed by femur placement of the permanent rotating platform after all excessive cement of been removed and patellar component held with a clamp.  We Marcaine Exparel was injected while cement was setting up at 14 minutes it was hard tourniquet was deflated hemostasis obtained standard layered closure #1 Vicryl interrupted 2-0 Vicryl subtendinous tissue skin staple closure postop dressing and knee immobilizer.  Patient tolerated procedure well and transferred to care room in stable  condition.

## 2020-04-21 NOTE — Anesthesia Procedure Notes (Signed)
Procedure Name: Intubation Date/Time: 04/21/2020 1:03 PM Performed by: Maksim Peregoy T, CRNA Pre-anesthesia Checklist: Patient identified, Emergency Drugs available, Suction available and Patient being monitored Patient Re-evaluated:Patient Re-evaluated prior to induction Oxygen Delivery Method: Circle system utilized Preoxygenation: Pre-oxygenation with 100% oxygen Induction Type: IV induction Ventilation: Mask ventilation without difficulty Laryngoscope Size: Miller and 2 Grade View: Grade I Tube type: Oral Tube size: 7.5 mm Number of attempts: 1 Airway Equipment and Method: Stylet and Oral airway Placement Confirmation: ETT inserted through vocal cords under direct vision,  positive ETCO2 and breath sounds checked- equal and bilateral Secured at: 22 cm Tube secured with: Tape Dental Injury: Teeth and Oropharynx as per pre-operative assessment

## 2020-04-21 NOTE — Anesthesia Postprocedure Evaluation (Signed)
Anesthesia Post Note  Patient: Jennifer Foley  Procedure(s) Performed: LEFT TOTAL KNEE ARTHROPLASTY (Left Knee)     Patient location during evaluation: PACU Anesthesia Type: Regional and General Level of consciousness: awake and alert Pain management: pain level controlled Vital Signs Assessment: post-procedure vital signs reviewed and stable Respiratory status: spontaneous breathing, nonlabored ventilation, respiratory function stable and patient connected to nasal cannula oxygen Cardiovascular status: blood pressure returned to baseline and stable Postop Assessment: no apparent nausea or vomiting Anesthetic complications: no   No complications documented.  Last Vitals:  Vitals:   04/21/20 1511 04/21/20 1526  BP: 125/65 131/73  Pulse: 62 (!) 51  Resp: 11 10  Temp:    SpO2: 96% 100%    Last Pain:  Vitals:   04/21/20 1205  TempSrc:   PainSc: 0-No pain                 Pervis Hocking

## 2020-04-21 NOTE — Anesthesia Procedure Notes (Signed)
Anesthesia Regional Block: Adductor canal block   Pre-Anesthetic Checklist: ,, timeout performed, Correct Patient, Correct Site, Correct Laterality, Correct Procedure, Correct Position, site marked, Risks and benefits discussed,  Surgical consent,  Pre-op evaluation,  At surgeon's request and post-op pain management  Laterality: Left  Prep: Maximum Sterile Barrier Precautions used, chloraprep       Needles:  Injection technique: Single-shot  Needle Type: Echogenic Stimulator Needle     Needle Length: 9cm  Needle Gauge: 22     Additional Needles:   Procedures:,,,, ultrasound used (permanent image in chart),,,,  Narrative:  Start time: 04/21/2020 11:48 AM End time: 04/21/2020 11:58 AM Injection made incrementally with aspirations every 5 mL.  Performed by: Personally  Anesthesiologist: Freddrick March, MD  Additional Notes: Monitors applied. No increased pain on injection. No increased resistance to injection. Injection made in 5cc increments. Good needle visualization. Patient tolerated procedure well.

## 2020-04-21 NOTE — Progress Notes (Signed)
PT Cancellation Note  Patient Details Name: Jennifer Foley MRN: [id number] DOB: 08-01-1956   Cancelled Treatment:    Reason Eval/Treat Not Completed: Fatigue/lethargy limiting ability to participate Pt reporting increased grogginess and falling asleep easily. Pt also with increased pain and requesting to hold on PT. Will follow up as schedule allows.   Lou Miner, DPT  Acute Rehabilitation Services  Pager: [phone] Office: [phone]    Rudean Hitt 04/21/2020, 5:38 PM

## 2020-04-21 NOTE — H&P (Signed)
TOTAL KNEE ADMISSION H&P  Patient is being admitted for left total knee arthroplasty.  Subjective:  Chief Complaint:left knee pain. 64 year old black female history of end-stage DJD left knee and chronic pain comes in for preop evaluation.  States that left knee symptoms unchanged from previous visit.  She is wanting to proceed with left total knee replacement as scheduled.  We have received preop medical and cardiac clearances.  Patient has been instructed to stop her Xarelto 3 days preop.  Today history and physical performed.  On exam patient does have mild scattered bilateral expiratory wheezes.  No respiratory distress.  States that she did not use her inhaler today and is supposed to use this twice daily. HPI: [name], 64 y.o. female, has a history of pain and functional disability in the left knee due to arthritis and has failed non-surgical conservative treatments for greater than 12 weeks to includeNSAID's and/or analgesics, corticosteriod injections, use of assistive devices and activity modification.  Onset of symptoms was gradual, starting 10 years ago with gradually worsening course since that time.   Patient currently rates pain in the left knee(s) at 10 out of 10 with activity. Patient has night pain, worsening of pain with activity and weight bearing, pain that interferes with activities of daily living, crepitus and joint swelling.  Patient has evidence of subchondral cysts, subchondral sclerosis, periarticular osteophytes and joint space narrowing by imaging studies. . There is no active infection.  Patient Active Problem List   Diagnosis Date Noted  . Unilateral primary osteoarthritis, left knee 03/24/2020  . Noncompliance with CPAP treatment 09/24/2018  . A-fib ([place]) 08/31/2018  . CHF (congestive heart failure) ([place]) 08/31/2018  . Acute pyelonephritis 08/31/2018  . Sepsis secondary to UTI ([place]) 08/31/2018  . Leucocytosis 08/31/2018  . Hypothyroidism 08/31/2018  . CKD  (chronic kidney disease) stage 3, GFR 30-59 ml/min 08/31/2018  . HTN (hypertension) 08/31/2018  . HLD (hyperlipidemia) 08/31/2018  . Anxiety and depression 08/31/2018  . Obesity 08/31/2018  . Sepsis ([place]) 08/31/2018  . Back pain 08/31/2018  . Obstructive sleep apnea treated with continuous positive airway pressure (CPAP) 08/13/2018   Past Medical History:  Diagnosis Date  . Anemia   . Arthritis    per patient, in left and right knee and both hands  . Atrial fibrillation ([place])   . Bilateral breast cysts   . CHF (congestive heart failure) ([place])   . Chronic kidney disease    stage 3  . Depression with anxiety   . DM (diabetes mellitus) ([place])   . Esophageal reflux   . History of hiatal hernia   . HTN (hypertension)   . Hyperlipemia   . Hyperthyroidism   . Hypothyroidism   . INR (international normal ratio) abnormal   . MRSA (methicillin resistant staph aureus) culture positive   . Panuveitis   . Pneumonia   . PTSD (post-traumatic stress disorder)   . Sleep apnea    per patient has CPAP, wears it at night sometimes    Past Surgical History:  Procedure Laterality Date  . ABDOMINAL HYSTERECTOMY    . BREAST BIOPSY    . CATARACT EXTRACTION  2018  . CESAREAN SECTION    . DENTAL SURGERY    . EYE SURGERY    . FOOT SURGERY    . HERNIA REPAIR    . KNEE SURGERY    . TONSILLECTOMY AND ADENOIDECTOMY      Current Facility-Administered Medications  Medication Dose Route Frequency Provider Last Rate Last Admin  .  ceFAZolin (ANCEF) 2-4 GM/100ML-% IVPB           . ceFAZolin (ANCEF) IVPB 2g/100 mL premix  2 g Intravenous Once [name], MD      . fentaNYL (SUBLIMAZE) 100 MCG/2ML injection           . lactated ringers infusion   Intravenous Continuous Brennan Bailey, MD 10 mL/hr at 04/21/20 1117 New Bag at 04/21/20 1117  . midazolam (VERSED) 2 MG/2ML injection            Allergies  Allergen Reactions  . Contrast Media [Iodinated Diagnostic Agents] Other (See Comments)     Unknown  . Doxycycline Other (See Comments)    Unknown  . Albuterol Rash    Pt states she had a rash from it once at [facility]  . Albuterol Sulfate Rash  . Amoxicillin Rash  . Codeine Rash  . Erythromycin Base Diarrhea and Rash    Social History   Tobacco Use  . Smoking status: Former Smoker    Packs/day: 0.25    Years: 34.00    Pack years: 8.50    Types: Cigarettes    Quit date: 12/15/2003    Years since quitting: 16.3  . Smokeless tobacco: Current User    Types: Snuff  . Tobacco comment: 04/15/2020: per patient 1-2 times a day, trying to stop taking it  Substance Use Topics  . Alcohol use: Not Currently    Family History  Problem Relation Age of Onset  . Alcoholism Father   . Arthritis Father   . Hypertension Father        sister, mother  . Diabetes Mellitus I Mother        sister  . Renal Disease Mother   . Stroke Mother   . Epilepsy Brother      Review of Systems  Constitutional: Negative.   HENT: Negative.   Cardiovascular: Negative.   Gastrointestinal: Negative.   Genitourinary: Negative.   Musculoskeletal: Positive for gait problem and joint swelling.  Psychiatric/Behavioral: Negative.     Objective:  Physical Exam HENT:     Head: Normocephalic and atraumatic.  Eyes:     Extraocular Movements: Extraocular movements intact.     Pupils: Pupils are equal, round, and reactive to light.  Cardiovascular:     Rate and Rhythm: Rhythm irregular.  Pulmonary:     Effort: No respiratory distress.     Breath sounds: Wheezing present.  Musculoskeletal:        General: Swelling and tenderness present.  Skin:    General: Skin is warm and dry.  Neurological:     General: No focal deficit present.     Mental Status: She is alert and oriented to person, place, and time.  Psychiatric:        Mood and Affect: Mood normal.     Vital signs in last 24 hours: Temp:  [98.3 F (36.8 C)] 98.3 F (36.8 C) (06/30 1029) Pulse Rate:  [80] 80 (06/30 1029) Resp:  [18] 18  (06/30 1029) BP: (132)/(74) 132/74 (06/30 1029) SpO2:  [98 %] 98 % (06/30 1029) Weight:  [92.5 kg] 92.5 kg (06/30 1029)  Labs:   Estimated body mass index is 39.84 kg/m as calculated from the following:   Height as of this encounter: 5' (1.524 m).   Weight as of this encounter: 92.5 kg.   Imaging Review Plain radiographs demonstrate moderate degenerative joint disease of the left knee(s). The overall alignment ismild varus. The bone quality appears  to be good for age and reported activity level.      Assessment/Plan:  End stage arthritis, left knee   The patient history, physical examination, clinical judgment of the provider and imaging studies are consistent with end stage degenerative joint disease of the left knee(s) and total knee arthroplasty is deemed medically necessary. The treatment options including medical management, injection therapy arthroscopy and arthroplasty were discussed at length. The risks and benefits of total knee arthroplasty were presented and reviewed. The risks due to aseptic loosening, infection, stiffness, patella tracking problems, thromboembolic complications and other imponderables were discussed. The patient acknowledged the explanation, agreed to proceed with the plan and consent was signed. Patient is being admitted for inpatient treatment for surgery, pain control, PT, OT, prophylactic antibiotics, VTE prophylaxis, progressive ambulation and ADL's and discharge planning. The patient is planning to be discharged home with home health services    Anticipated LOS equal to or greater than 2 midnights due to - Age 104 and older with one or more of the following:  - Obesity  - Expected need for hospital services (PT, OT, Nursing) required for safe  discharge  - Anticipated need for postoperative skilled nursing care or inpatient rehab  - Active co-morbidities: Heart Failure and Cardiac Arrhythmia OR   - Unanticipated findings during/Post Surgery:  None  - Patient is a high risk of re-admission due to: None

## 2020-04-21 NOTE — Plan of Care (Signed)
  Problem: Clinical Measurements: Goal: Respiratory complications will improve Outcome: Progressing   

## 2020-04-21 NOTE — Interval H&P Note (Signed)
History and Physical Interval Note:  04/21/2020 12:32 PM  Jennifer Foley  has presented today for surgery, with the diagnosis of left knee osteoarthritis.  The various methods of treatment have been discussed with the patient and family. After consideration of risks, benefits and other options for treatment, the patient has consented to  Procedure(s): LEFT TOTAL KNEE ARTHROPLASTY (Left) as a surgical intervention.  The patient's history has been reviewed, patient examined, no change in status, stable for surgery.  I have reviewed the patient's chart and labs.  Questions were answered to the patient's satisfaction.     [name]

## 2020-04-21 NOTE — Transfer of Care (Signed)
Immediate Anesthesia Transfer of Care Note  Patient: Jennifer Foley  Procedure(s) Performed: LEFT TOTAL KNEE ARTHROPLASTY (Left Knee)  Patient Location: PACU  Anesthesia Type:GA combined with regional for post-op pain  Level of Consciousness: drowsy  Airway & Oxygen Therapy: Patient Spontanous Breathing and Patient connected to nasal cannula oxygen  Post-op Assessment: Report given to RN, Post -op Vital signs reviewed and stable and Patient moving all extremities  Post vital signs: Reviewed and stable  Last Vitals:  Vitals Value Taken Time  BP 122/60 04/21/20 1456  Temp 36.6 C 04/21/20 1456  Pulse 66 04/21/20 1503  Resp 8 04/21/20 1503  SpO2 100 % 04/21/20 1503  Vitals shown include unvalidated device data.  Last Pain:  Vitals:   04/21/20 1205  TempSrc:   PainSc: 0-No pain      Patients Stated Pain Goal: 0 (49/32/41 9914)  Complications: No complications documented.

## 2020-04-22 ENCOUNTER — Encounter (HOSPITAL_COMMUNITY): Payer: Self-pay | Admitting: Orthopaedic Surgery

## 2020-04-22 DIAGNOSIS — Z20822 Contact with and (suspected) exposure to covid-19: Secondary | ICD-10-CM | POA: Diagnosis present

## 2020-04-22 DIAGNOSIS — I509 Heart failure, unspecified: Secondary | ICD-10-CM | POA: Diagnosis present

## 2020-04-22 DIAGNOSIS — Z96659 Presence of unspecified artificial knee joint: Secondary | ICD-10-CM

## 2020-04-22 DIAGNOSIS — K219 Gastro-esophageal reflux disease without esophagitis: Secondary | ICD-10-CM | POA: Diagnosis present

## 2020-04-22 DIAGNOSIS — E785 Hyperlipidemia, unspecified: Secondary | ICD-10-CM | POA: Diagnosis present

## 2020-04-22 DIAGNOSIS — E039 Hypothyroidism, unspecified: Secondary | ICD-10-CM | POA: Diagnosis present

## 2020-04-22 DIAGNOSIS — E1122 Type 2 diabetes mellitus with diabetic chronic kidney disease: Secondary | ICD-10-CM | POA: Diagnosis present

## 2020-04-22 DIAGNOSIS — M1712 Unilateral primary osteoarthritis, left knee: Secondary | ICD-10-CM | POA: Diagnosis present

## 2020-04-22 DIAGNOSIS — Z6839 Body mass index (BMI) 39.0-39.9, adult: Secondary | ICD-10-CM | POA: Diagnosis not present

## 2020-04-22 DIAGNOSIS — N183 Chronic kidney disease, stage 3 unspecified: Secondary | ICD-10-CM | POA: Diagnosis present

## 2020-04-22 DIAGNOSIS — F17220 Nicotine dependence, chewing tobacco, uncomplicated: Secondary | ICD-10-CM | POA: Diagnosis present

## 2020-04-22 DIAGNOSIS — F4310 Post-traumatic stress disorder, unspecified: Secondary | ICD-10-CM | POA: Diagnosis present

## 2020-04-22 DIAGNOSIS — Z9889 Other specified postprocedural states: Secondary | ICD-10-CM | POA: Diagnosis not present

## 2020-04-22 DIAGNOSIS — I4891 Unspecified atrial fibrillation: Secondary | ICD-10-CM | POA: Diagnosis present

## 2020-04-22 DIAGNOSIS — I130 Hypertensive heart and chronic kidney disease with heart failure and stage 1 through stage 4 chronic kidney disease, or unspecified chronic kidney disease: Secondary | ICD-10-CM | POA: Diagnosis present

## 2020-04-22 DIAGNOSIS — Z90710 Acquired absence of both cervix and uterus: Secondary | ICD-10-CM | POA: Diagnosis not present

## 2020-04-22 LAB — GLUCOSE, CAPILLARY
Glucose-Capillary: 114 mg/dL — ABNORMAL HIGH (ref 70–99)
Glucose-Capillary: 119 mg/dL — ABNORMAL HIGH (ref 70–99)
Glucose-Capillary: 109 mg/dL — ABNORMAL HIGH (ref 70–99)

## 2020-04-22 LAB — CBC
HCT: 34.4 % — ABNORMAL LOW (ref 36.0–46.0)
Hemoglobin: 10.7 g/dL — ABNORMAL LOW (ref 12.0–15.0)
MCH: 29.1 pg (ref 26.0–34.0)
MCHC: 31.1 g/dL (ref 30.0–36.0)
MCV: 93.5 fL (ref 80.0–100.0)
Platelets: 212 10*3/uL (ref 150–400)
RBC: 3.68 MIL/uL — ABNORMAL LOW (ref 3.87–5.11)
RDW: 13.3 % (ref 11.5–15.5)
WBC: 10.9 10*3/uL — ABNORMAL HIGH (ref 4.0–10.5)
nRBC: 0 % (ref 0.0–0.2)

## 2020-04-22 LAB — BASIC METABOLIC PANEL
Anion gap: 9 (ref 5–15)
BUN: 13 mg/dL (ref 8–23)
CO2: 27 mmol/L (ref 22–32)
Calcium: 8.6 mg/dL — ABNORMAL LOW (ref 8.9–10.3)
Chloride: 104 mmol/L (ref 98–111)
Creatinine, Ser: 1.42 mg/dL — ABNORMAL HIGH (ref 0.44–1.00)
GFR calc Af Amer: 45 mL/min — ABNORMAL LOW (ref >60)
GFR calc non Af Amer: 39 mL/min — ABNORMAL LOW (ref >60)
Glucose, Bld: 150 mg/dL — ABNORMAL HIGH (ref 70–99)
Potassium: 4.8 mmol/L (ref 3.5–5.1)
Sodium: 140 mmol/L (ref 135–145)

## 2020-04-22 MED ORDER — OXYCODONE-ACETAMINOPHEN 5-325 MG PO TABS: 1.0000 | ORAL_TABLET | Freq: Four times a day (QID) | ORAL | 0 refills | Status: DC | PRN

## 2020-04-22 MED ORDER — OXYCODONE HCL 5 MG PO TABS
5.0000 mg | ORAL_TABLET | ORAL | Status: DC | PRN
  Administered 2020-04-22 – 2020-04-25 (×15): 10 mg via ORAL
  Filled 2020-04-22 (×15): qty 2

## 2020-04-22 NOTE — Progress Notes (Addendum)
Did poorly with PT due to pain. Increased to 5-10 mg oxy PO to help control her pain. Possible home tomorrow. Rx aleady sent in to her pharmacy.   Changed to admission status from OBS. Since not mobile enough for safe discharge today.

## 2020-04-22 NOTE — Care Management Obs Status (Signed)
[place] NOTIFICATION   Patient Details  Name: Jennifer Foley MRN: [id number] Date of Birth: [birth year]   Medicare Observation Status Notification Given:  Yes    Sharin Mons, RN 04/22/2020, 9:28 AM

## 2020-04-22 NOTE — Progress Notes (Signed)
   Subjective: 1 Day Post-Op Procedure(s) (LRB): LEFT TOTAL KNEE ARTHROPLASTY (Left) Patient reports pain as moderate.    Objective: Vital signs in last 24 hours: Temp:  [97.4 F (36.3 C)-98.5 F (36.9 C)] 98.5 F (36.9 C) (07/01 0349) Pulse Rate:  [51-80] 73 (07/01 0737) Resp:  [7-18] 16 (07/01 0737) BP: (110-143)/(60-78) 110/61 (07/01 0349) SpO2:  [93 %-100 %] 98 % (07/01 0737) Weight:  [92.5 kg] 92.5 kg (06/30 1029)  Intake/Output from previous day: 06/30 0701 - 07/01 0700 In: 1140.9 [I.V.:1140.9] Out: 1500 [Urine:1300; Blood:200] Intake/Output this shift: No intake/output data recorded.  Recent Labs    04/22/20 0415  HGB 10.7*   Recent Labs    04/22/20 0415  WBC 10.9*  RBC 3.68*  HCT 34.4*  PLT 212   Recent Labs    04/22/20 0415  NA 140  K 4.8  CL 104  CO2 27  BUN 13  CREATININE 1.42*  GLUCOSE 150*  CALCIUM 8.6*   Recent Labs    04/21/20 1024  INR 1.0    Neurologically intact DG Knee 1-2 Views Left  Result Date: 04/21/2020 CLINICAL DATA:  Postop EXAM: LEFT KNEE - 1-2 VIEW COMPARISON:  03/09/2020 FINDINGS: Changes of left knee replacement. No hardware bony complicating feature. Soft tissue and joint space gas noted. IMPRESSION: Left knee replacement.  No visible complicating feature. Electronically Signed   By: Rolm Baptise M.D.   On: 04/21/2020 16:37    Assessment/Plan: 1 Day Post-Op Procedure(s) (LRB): LEFT TOTAL KNEE ARTHROPLASTY (Left) Up with therapy, dressing changed. Incision looks good.   [name] 04/22/2020, 8:09 AM

## 2020-04-22 NOTE — Discharge Instructions (Addendum)
Information on my medicine - XARELTO (Rivaroxaban)  Why was Xarelto prescribed for you? Xarelto was prescribed for you to reduce the risk of a blood clot forming that can cause a stroke if you have a medical condition called atrial fibrillation (a type of irregular heartbeat).  What do you need to know about xarelto ? Take your Xarelto ONCE DAILY at the same time every day with your evening meal. If you have difficulty swallowing the tablet whole, you may crush it and mix in applesauce just prior to taking your dose.  Take Xarelto exactly as prescribed by your doctor and DO NOT stop taking Xarelto without talking to the doctor who prescribed the medication.  Stopping without other stroke prevention medication to take the place of Xarelto may increase your risk of developing a clot that causes a stroke.  Refill your prescription before you run out.  After discharge, you should have regular check-up appointments with your healthcare provider that is prescribing your Xarelto.  In the future your dose may need to be changed if your kidney function or weight changes by a significant amount.  What do you do if you miss a dose? If you are taking Xarelto ONCE DAILY and you miss a dose, take it as soon as you remember on the same day then continue your regularly scheduled once daily regimen the next day. Do not take two doses of Xarelto at the same time or on the same day.   Important Safety Information A possible side effect of Xarelto is bleeding. You should call your healthcare provider right away if you experience any of the following: ? Bleeding from an injury or your nose that does not stop. ? Unusual colored urine (red or dark brown) or unusual colored stools (red or black). ? Unusual bruising for unknown reasons. ? A serious fall or if you hit your head (even if there is no bleeding).  Some medicines may interact with Xarelto and might increase your risk of bleeding while on  Xarelto. To help avoid this, consult your healthcare provider or pharmacist prior to using any new prescription or non-prescription medications, including herbals, vitamins, non-steroidal anti-inflammatory drugs (NSAIDs) and supplements.  This website has more information on Xarelto: [email or web address]-benson.com/.  INSTRUCTIONS AFTER JOINT REPLACEMENT   o Remove items at home which could result in a fall. This includes throw rugs or furniture in walking pathways o ICE to the affected joint every three hours while awake for 30 minutes at a time, for at least the first 3-5 days, and then as needed for pain and swelling.  Continue to use ice for pain and swelling. You may notice swelling that will progress down to the foot and ankle.  This is normal after surgery.  Elevate your leg when you are not up walking on it.   o Continue to use the breathing machine you got in the hospital (incentive spirometer) which will help keep your temperature down.  It is common for your temperature to cycle up and down following surgery, especially at night when you are not up moving around and exerting yourself.  The breathing machine keeps your lungs expanded and your temperature down.   DIET:  As you were doing prior to hospitalization, we recommend a well-balanced diet.  DRESSING / WOUND CARE / SHOWERING  Keep the surgical dressing until follow up.  The dressing is water proof, so you can shower without any extra covering.  IF THE DRESSING FALLS OFF or the wound gets wet  inside, change the dressing with sterile gauze.  Please use good hand washing techniques before changing the dressing.  Do not use any lotions or creams on the incision until instructed by your surgeon.    ACTIVITY  o Increase activity slowly as tolerated, but follow the weight bearing instructions below.   o No driving for 6 weeks or until further direction given by your physician.  You cannot drive while taking narcotics.  o No lifting or carrying greater  than 10 lbs. until further directed by your surgeon. o Avoid periods of inactivity such as sitting longer than an hour when not asleep. This helps prevent blood clots.  o You may return to work once you are authorized by your doctor.     WEIGHT BEARING   Weight bearing as tolerated with assist device (walker, cane, etc) as directed, use it as long as suggested by your surgeon or therapist, typically at least 4-6 weeks.   EXERCISES  Results after joint replacement surgery are often greatly improved when you follow the exercise, range of motion and muscle strengthening exercises prescribed by your doctor. Safety measures are also important to protect the joint from further injury. Any time any of these exercises cause you to have increased pain or swelling, decrease what you are doing until you are comfortable again and then slowly increase them. If you have problems or questions, call your caregiver or physical therapist for advice.   Rehabilitation is important following a joint replacement. After just a few days of immobilization, the muscles of the leg can become weakened and shrink (atrophy).  These exercises are designed to build up the tone and strength of the thigh and leg muscles and to improve motion. Often times heat used for twenty to thirty minutes before working out will loosen up your tissues and help with improving the range of motion but do not use heat for the first two weeks following surgery (sometimes heat can increase post-operative swelling).   These exercises can be done on a training (exercise) mat, on the floor, on a table or on a bed. Use whatever works the best and is most comfortable for you.    Use music or television while you are exercising so that the exercises are a pleasant break in your day. This will make your life better with the exercises acting as a break in your routine that you can look forward to.   Perform all exercises about fifteen times, three times per  day or as directed.  You should exercise both the operative leg and the other leg as well.  Exercises include:   . Quad Sets - Tighten up the muscle on the front of the thigh (Quad) and hold for 5-10 seconds.   . Straight Leg Raises - With your knee straight (if you were given a brace, keep it on), lift the leg to 60 degrees, hold for 3 seconds, and slowly lower the leg.  Perform this exercise against resistance later as your leg gets stronger.  . Leg Slides: Lying on your back, slowly slide your foot toward your buttocks, bending your knee up off the floor (only go as far as is comfortable). Then slowly slide your foot back down until your leg is flat on the floor again.  [place] Haring Wings: Lying on your back spread your legs to the side as far apart as you can without causing discomfort.  . Hamstring Strength:  Lying on your back, push your heel against the floor  with your leg straight by tightening up the muscles of your buttocks.  Repeat, but this time bend your knee to a comfortable angle, and push your heel against the floor.  You may put a pillow under the heel to make it more comfortable if necessary.   A rehabilitation program following joint replacement surgery can speed recovery and prevent re-injury in the future due to weakened muscles. Contact your doctor or a physical therapist for more information on knee rehabilitation.    CONSTIPATION  Constipation is defined medically as fewer than three stools per week and severe constipation as less than one stool per week.  Even if you have a regular bowel pattern at home, your normal regimen is likely to be disrupted due to multiple reasons following surgery.  Combination of anesthesia, postoperative narcotics, change in appetite and fluid intake all can affect your bowels.   YOU MUST use at least one of the following options; they are listed in order of increasing strength to get the job done.  They are all available over the counter, and you may  need to use some, POSSIBLY even all of these options:    Drink plenty of fluids (prune juice may be helpful) and high fiber foods Colace 100 mg by mouth twice a day  Senokot for constipation as directed and as needed Dulcolax (bisacodyl), take with full glass of water  Miralax (polyethylene glycol) once or twice a day as needed.  If you have tried all these things and are unable to have a bowel movement in the first 3-4 days after surgery call either your surgeon or your primary doctor.    If you experience loose stools or diarrhea, hold the medications until you stool forms back up.  If your symptoms do not get better within 1 week or if they get worse, check with your doctor.  If you experience "the worst abdominal pain ever" or develop nausea or vomiting, please contact the office immediately for further recommendations for treatment.   ITCHING:  If you experience itching with your medications, try taking only a single pain pill, or even half a pain pill at a time.  You can also use Benadryl over the counter for itching or also to help with sleep.   TED HOSE STOCKINGS:  Use stockings on both legs until for at least 2 weeks or as directed by physician office. They may be removed at night for sleeping.  MEDICATIONS:  See your medication summary on the "After Visit Summary" that nursing will review with you.  You may have some home medications which will be placed on hold until you complete the course of blood thinner medication.  It is important for you to complete the blood thinner medication as prescribed.  PRECAUTIONS:  If you experience chest pain or shortness of breath - call 911 immediately for transfer to the hospital emergency department.   If you develop a fever greater that 101 F, purulent drainage from wound, increased redness or drainage from wound, foul odor from the wound/dressing, or calf pain - CONTACT YOUR SURGEON.                                                   FOLLOW-UP  APPOINTMENTS:  If you do not already have a post-op appointment, please call the office for an appointment to be  seen by your surgeon.  Guidelines for how soon to be seen are listed in your "After Visit Summary", but are typically between 1-4 weeks after surgery.  OTHER INSTRUCTIONS:   Knee Replacement:  Do not place pillow under knee, focus on keeping the knee straight while resting. CPM instructions: 0-90 degrees, 2 hours in the morning, 2 hours in the afternoon, and 2 hours in the evening. Place foam block, curve side up under heel at all times except when in CPM or when walking.  DO NOT modify, tear, cut, or change the foam block in any way.   DENTAL ANTIBIOTICS:  In most cases prophylactic antibiotics for Dental procdeures after total joint surgery are not necessary.  Exceptions are as follows:  1. History of prior total joint infection  2. Severely immunocompromised (Organ Transplant, cancer chemotherapy, Rheumatoid biologic meds such as [place])  3. Poorly controlled diabetes (A1C &gt; 8.0, blood glucose over 200)  If you have one of these conditions, contact your surgeon for an antibiotic prescription, prior to your dental procedure.   MAKE SURE YOU:  . Understand these instructions.  . Get help right away if you are not doing well or get worse.    Thank you for letting us be a part of your medical care team.  It is a privilege we respect greatly.  We hope these instructions will help you stay on track for a fast and full recovery!  Dental Antibiotics:  In most cases prophylactic antibiotics for Dental procdeures after total joint surgery are not necessary.  Exceptions are as follows:  1. History of prior total joint infection  2. Severely immunocompromised (Organ Transplant, cancer chemotherapy, Rheumatoid biologic meds such as [place])  3. Poorly controlled diabetes (A1C &gt; 8.0, blood glucose over 200)  If you have one of these conditions, contact your surgeon for  an antibiotic prescription, prior to your dental procedure.

## 2020-04-22 NOTE — Evaluation (Signed)
Physical Therapy Evaluation Patient Details Name: Jennifer Foley MRN: [id number] DOB: 1955-12-11 Today's Date: 04/22/2020   History of Present Illness  Pt is a 64 y.o. F with significant PMH of atrial fibrillation, CHF who presents s/p left total knee arthroplasty 04/21/2020.  Clinical Impression  Prior to admission, pt lives with her daughter in a level entry house, uses a quad cane for mobility and is independent with ADL's. On PT evaluation, pt presents with decreased functional mobility secondary to left knee pain, decreased ROM and weakness. Requiring min assist for bed mobility/transfers. Ambulating 20 feet with a walker at a min guard assist level, utilizing a step to pattern with slow pace. Initiated instruction on HEP for strengthening and ROM. Pt achieving 86 degrees of seated knee flexion. Will continue to progress mobility as tolerated.        Follow Up Recommendations Home health PT;Supervision for mobility/OOB    Equipment Recommendations  Rolling walker with 5" wheels;3in1 (PT)    Recommendations for Other Services       Precautions / Restrictions Precautions Precautions: Fall Restrictions Weight Bearing Restrictions: No      Mobility  Bed Mobility Overal bed mobility: Needs Assistance Bed Mobility: Supine to Sit     Supine to sit: Min assist     General bed mobility comments: minA for LLE management. Increased time, use of bed rails  Transfers Overall transfer level: Needs assistance Equipment used: Rolling walker (2 wheeled) Transfers: Sit to/from Stand Sit to Stand: Min assist         General transfer comment: MinA to boost up to stand from edge of bed. Cues for hand placement and transitioning right hand to walker.  Ambulation/Gait Ambulation/Gait assistance: Min guard Gait Distance (Feet): 20 Feet Assistive device: Rolling walker (2 wheeled) Gait Pattern/deviations: Step-to pattern;Decreased stride length;Decreased weight shift to  left;Antalgic;Trunk flexed Gait velocity: decreased Gait velocity interpretation: <1.31 ft/sec, indicative of household ambulator General Gait Details: Cues for upright posture, walker proximity, sequencing, segmental turning.   Stairs            Wheelchair Mobility    Modified Rankin (Stroke Patients Only)       Balance Overall balance assessment: Needs assistance Sitting-balance support: Feet unsupported Sitting balance-Leahy Scale: Good     Standing balance support: Bilateral upper extremity supported Standing balance-Leahy Scale: Poor Standing balance comment: reliant on external support                             Pertinent Vitals/Pain Pain Assessment: Faces Faces Pain Scale: Hurts even more Pain Location: L knee with movement and flexion Pain Descriptors / Indicators: Grimacing;Operative site guarding Pain Intervention(s): Limited activity within patient's tolerance;Monitored during session;Patient requesting pain meds-RN notified    Home Living Family/patient expects to be discharged to:: Private residence Living Arrangements: Children (daughter) Available Help at Discharge: Family;Available 24 hours/day Type of Home: House Home Access: Stairs to enter   [employer] of Steps: 1 (small curb) Home Layout: One level Home Equipment: Cane - quad      Prior Function Level of Independence: Independent with assistive device(s)         Comments: intermittently uses quad cane, denies falls in past 3 months     Hand Dominance        Extremity/Trunk Assessment   Upper Extremity Assessment Upper Extremity Assessment: Overall WFL for tasks assessed    Lower Extremity Assessment Lower Extremity Assessment: LLE deficits/detail LLE Deficits /  Details: s/p TKA. Able to perform weak quad set, ankle dorsiflexion/plantarflexion WFL       Communication   Communication: No difficulties  Cognition Arousal/Alertness:  Awake/alert Behavior During Therapy: WFL for tasks assessed/performed Overall Cognitive Status: Within Functional Limits for tasks assessed                                        General Comments      Exercises Total Joint Exercises Ankle Circles/Pumps: Both;10 reps;Supine Quad Sets: Left;10 reps;Supine Heel Slides: Left;5 reps;Seated Goniometric ROM: Seated: 0-86 degrees    Assessment/Plan    PT Assessment Patient needs continued PT services  PT Problem List Decreased strength;Decreased range of motion;Decreased activity tolerance;Decreased balance;Decreased mobility;Pain       PT Treatment Interventions DME instruction;Gait training;Stair training;Functional mobility training;Therapeutic activities;Therapeutic exercise;Balance training;Patient/family education    PT Goals (Current goals can be found in the Care Plan section)  Acute Rehab PT Goals Patient Stated Goal: less pain PT Goal Formulation: With patient Time For Goal Achievement: 05/06/20 Potential to Achieve Goals: Good    Frequency 7X/week   Barriers to discharge        Co-evaluation               AM-PAC PT "6 Clicks" Mobility  Outcome Measure Help needed turning from your back to your side while in a flat bed without using bedrails?: None Help needed moving from lying on your back to sitting on the side of a flat bed without using bedrails?: A Little Help needed moving to and from a bed to a chair (including a wheelchair)?: A Little Help needed standing up from a chair using your arms (e.g., wheelchair or bedside chair)?: A Little Help needed to walk in hospital room?: A Little Help needed climbing 3-5 steps with a railing? : A Lot 6 Click Score: 18    End of Session Equipment Utilized During Treatment: Gait belt Activity Tolerance: Patient tolerated treatment well Patient left: in chair;with call bell/phone within reach;with chair alarm set Nurse Communication: Mobility  status;Patient requests pain meds PT Visit Diagnosis: Other abnormalities of gait and mobility (R26.89);Difficulty in walking, not elsewhere classified (R26.2);Pain Pain - Right/Left: Left Pain - part of body: Knee    Time: 2025-4270 PT Time Calculation (min) (ACUTE ONLY): 33 min   Charges:   PT Evaluation $PT Eval Low Complexity: 1 Low PT Treatments $Gait Training: 8-22 mins          Wyona Almas, PT, DPT Acute Rehabilitation Services Pager [phone] Office [phone]   Deno Etienne 04/22/2020, 8:54 AM

## 2020-04-22 NOTE — TOC Progression Note (Addendum)
Transition of Care [facility]) - Progression Note    Patient Details  Name: Jennifer Foley MRN: [id number] Date of Birth: February 18, 1956  [facility] CM/SW Contact  Sharin Mons, RN Phone Number: 04/22/2020, 4:54 PM  Clinical Narrative:   S/p L TKA 04/21/20. PMH of atrial fibrillation, CHF, CKD, HLD. From home alone. States daughter to assist with care once d/c. Pt not ready for d/c per MD. Agreeable to home health services. Pt without preference. NCM made referral with  [facility] and accepted, [place] 7/7. MD made aware.      04/23/2020 @1300  NCM received consult for possible SNF placement at time of discharge. NCM spoke with patient regarding PT recommendation of SNF placement at time of discharge. Patient reported she has changed her mind and is currently   unable to care for self at  home given her current physical needs and fall risk. Patient expressed understanding of PT recommendation and is agreeable to SNF placement at time of discharge. Patient without preference. NCM discussed insurance authorization process and provided Medicare SNF ratings list. Patient expressed being hopeful for rehab and to feel better soon. No further questions reported at this time. NCM to continue to follow and assist with discharge planning needs.   Expected Discharge Plan: [place] Barriers to Discharge: Continued Medical Work up  Expected Discharge Plan and Services Expected Discharge Plan: [place]   Discharge Planning Services: CM Consult                     DME Arranged: 3-N-1, Walker rolling DME Agency: [employer] Date DME Agency Contacted: 04/22/20 Time DME Agency Contacted: [phone] Representative spoke with at DME Agency: [place]: PT [place]: [place] Date [place]: 04/22/20 Time [place]: 1653 Representative spoke with at [place]: [place] Determinants of Health ([place])  Interventions    Readmission Risk Interventions No flowsheet data found.

## 2020-04-22 NOTE — Plan of Care (Signed)
  Problem: Pain Managment: Goal: General experience of comfort will improve Outcome: Progressing   

## 2020-04-22 NOTE — Progress Notes (Addendum)
Physical Therapy Treatment Patient Details Name: Jennifer Foley MRN: [id number] DOB: 1956-05-24 Today's Date: 04/22/2020    History of Present Illness Pt is a 64 y.o. F with significant PMH of atrial fibrillation, CHF who presents s/p left total knee arthroplasty 04/21/2020.    PT Comments    Pt received for afternoon session seated in chair; upon entry, she was crying, shaking and moaning due to left knee pain, although she was premedicated prior to session. Pt stated she had been up in the chair all day and reported she was told "she had to sit up until 5 PM." RN notified regarding this statement. Treatment limited to transferring to a bedside commode and back to bed. Repositioned in supine and ice applied. Will continue to progress mobility as tolerated.    Follow Up Recommendations  Home health PT;Supervision for mobility/OOB     Equipment Recommendations  Rolling walker with 5" wheels;3in1 (PT)    Recommendations for Other Services       Precautions / Restrictions Precautions Precautions: Fall Restrictions Weight Bearing Restrictions: No    Mobility  Bed Mobility Overal bed mobility: Needs Assistance Bed Mobility: Sit to Supine       Sit to supine: Mod assist   General bed mobility comments: ModA for BLE management back into bed  Transfers Overall transfer level: Needs assistance Equipment used: Rolling walker (2 wheeled) Transfers: Sit to/from Stand Sit to Stand: Min assist         General transfer comment: MinA to boost up to stand from recliner and BSC. Cues for hand placement and transition of hands to walker. Increased time/effort.   Ambulation/Gait Ambulation/Gait assistance: Min guard Gait Distance (Feet): 3 Feet Assistive device: Rolling walker (2 wheeled) Gait Pattern/deviations: Step-to pattern;Decreased stride length;Decreased weight shift to left;Antalgic;Trunk flexed Gait velocity: decreased Gait velocity interpretation: <1.31 ft/sec,  indicative of household ambulator General Gait Details: Cues for sequencing/direction   Stairs             Wheelchair Mobility    Modified Rankin (Stroke Patients Only)       Balance Overall balance assessment: Needs assistance Sitting-balance support: Feet unsupported Sitting balance-Leahy Scale: Good     Standing balance support: Bilateral upper extremity supported Standing balance-Leahy Scale: Poor Standing balance comment: reliant on external support                            Cognition Arousal/Alertness: Awake/alert Behavior During Therapy: WFL for tasks assessed/performed Overall Cognitive Status: Within Functional Limits for tasks assessed                                        Exercises      General Comments        Pertinent Vitals/Pain Pain Assessment: Faces Faces Pain Scale: Hurts worst Pain Location: L knee with movement and flexion Pain Descriptors / Indicators: Grimacing;Operative site guarding Pain Intervention(s): Limited activity within patient's tolerance;Monitored during session;Premedicated before session;Repositioned;Ice applied    Home Living                      Prior Function            PT Goals (current goals can now be found in the care plan section) Acute Rehab PT Goals Patient Stated Goal: less pain PT Goal Formulation: With patient Time For Goal Achievement:  05/06/20 Potential to Achieve Goals: Good Progress towards PT goals: Progressing toward goals    Frequency    7X/week      PT Plan Current plan remains appropriate    Co-evaluation              AM-PAC PT "6 Clicks" Mobility   Outcome Measure  Help needed turning from your back to your side while in a flat bed without using bedrails?: None Help needed moving from lying on your back to sitting on the side of a flat bed without using bedrails?: A Little Help needed moving to and from a bed to a chair (including a  wheelchair)?: A Little Help needed standing up from a chair using your arms (e.g., wheelchair or bedside chair)?: A Little Help needed to walk in hospital room?: A Little Help needed climbing 3-5 steps with a railing? : A Lot 6 Click Score: 18    End of Session Equipment Utilized During Treatment: Gait belt Activity Tolerance: Patient limited by pain Patient left: with call bell/phone within reach;in bed Nurse Communication: Mobility status;Patient requests pain meds PT Visit Diagnosis: Other abnormalities of gait and mobility (R26.89);Difficulty in walking, not elsewhere classified (R26.2);Pain Pain - Right/Left: Left Pain - part of body: Knee     Time: 4696-2952 PT Time Calculation (min) (ACUTE ONLY): 20 min  Charges:  $Therapeutic Activity: 8-22 mins                       Jennifer Foley, PT, DPT Acute Rehabilitation Services Pager [phone] Office [phone]    Jennifer Foley 04/22/2020, 4:38 PM

## 2020-04-22 NOTE — Care Management CC44 (Signed)
Condition Code 44 Documentation Completed  Patient Details  Name: Jennifer Foley MRN: [id number] Date of Birth: 07/10/1956   Condition Code 44 given:  Yes Patient signature on Condition Code 44 notice:  Yes Documentation of 2 MD's agreement:  Yes Code 44 added to claim:  Yes    Sharin Mons, RN 04/22/2020, 9:28 AM

## 2020-04-23 ENCOUNTER — Inpatient Hospital Stay (HOSPITAL_COMMUNITY): Payer: Medicare Other

## 2020-04-23 DIAGNOSIS — Z9889 Other specified postprocedural states: Secondary | ICD-10-CM

## 2020-04-23 LAB — CBC
HCT: 32.2 % — ABNORMAL LOW (ref 36.0–46.0)
Hemoglobin: 10.1 g/dL — ABNORMAL LOW (ref 12.0–15.0)
MCH: 29 pg (ref 26.0–34.0)
MCHC: 31.4 g/dL (ref 30.0–36.0)
MCV: 92.5 fL (ref 80.0–100.0)
Platelets: 175 10*3/uL (ref 150–400)
RBC: 3.48 MIL/uL — ABNORMAL LOW (ref 3.87–5.11)
RDW: 13.7 % (ref 11.5–15.5)
WBC: 12.4 10*3/uL — ABNORMAL HIGH (ref 4.0–10.5)
nRBC: 0 % (ref 0.0–0.2)

## 2020-04-23 MED ORDER — OXYCODONE-ACETAMINOPHEN 5-325 MG PO TABS: 1.0000 | ORAL_TABLET | ORAL | 0 refills | Status: DC | PRN

## 2020-04-23 MED ORDER — METHOCARBAMOL 500 MG PO TABS: 500.0000 mg | ORAL_TABLET | Freq: Four times a day (QID) | ORAL | 0 refills | Status: DC | PRN

## 2020-04-23 NOTE — NC FL2 (Signed)
[place] LEVEL OF CARE SCREENING TOOL     IDENTIFICATION  Patient Name: Jennifer Foley Birthdate: Feb 05, 1956 Sex: female Admission Date (Current Location): 04/21/2020  [facility] and [place] Number:  Herbalist and Address:  [facility] [place]. [facility], [place] [address] [place], [place] [address]      Provider Number: [id number]  Attending Physician Name and Address:  [name], MD  Relative Name and Phone Number:       Current Level of Care: Hospital Recommended Level of Care: [place] Prior Approval Number:    Date Approved/Denied:   PASRR Number: [id number]  Discharge Plan: SNF    Current Diagnoses: Patient Active Problem List   Diagnosis Date Noted  . Total knee replacement status 04/22/2020  . Arthritis of left knee 04/21/2020  . Unilateral primary osteoarthritis, left knee 03/24/2020  . Noncompliance with CPAP treatment 09/24/2018  . A-fib ([place]) 08/31/2018  . CHF (congestive heart failure) ([place]) 08/31/2018  . Acute pyelonephritis 08/31/2018  . Sepsis secondary to UTI ([place]) 08/31/2018  . Leucocytosis 08/31/2018  . Hypothyroidism 08/31/2018  . CKD (chronic kidney disease) stage 3, GFR 30-59 ml/min 08/31/2018  . HTN (hypertension) 08/31/2018  . HLD (hyperlipidemia) 08/31/2018  . Anxiety and depression 08/31/2018  . Obesity 08/31/2018  . Sepsis ([place]) 08/31/2018  . Back pain 08/31/2018  . Obstructive sleep apnea treated with continuous positive airway pressure (CPAP) 08/13/2018    Orientation RESPIRATION BLADDER Height & Weight     Self, Time, Situation, Place  Normal Continent Weight: 92.5 kg Height:  5' (152.4 cm)  BEHAVIORAL SYMPTOMS/MOOD NEUROLOGICAL BOWEL NUTRITION STATUS      Continent Diet (refer to d/c summary)  AMBULATORY STATUS COMMUNICATION OF NEEDS Skin   Extensive Assist Verbally Surgical wounds (L TKA 04/21/20)                       Personal Care Assistance Level of Assistance   Bathing, Feeding, Dressing Bathing Assistance: Maximum assistance Feeding assistance: Independent Dressing Assistance: Maximum assistance     Functional Limitations Info  Sight, Hearing, Speech Sight Info: Adequate Hearing Info: Adequate Speech Info: Adequate    SPECIAL CARE FACTORS FREQUENCY  PT (By licensed PT), OT (By licensed OT)     PT Frequency: 5x/ week, evaluate and treat OT Frequency: 5x/ week, evaluate and treat            Contractures Contractures Info: Not present    Additional Factors Info  Code Status, Allergies Code Status Info: Full code Allergies Info: Contrast Media Iodinated Diagnostic Agents, Doxycycline, Albuterol, Albuterol Sulfate, Amoxicillin, Codeine, Erythromycin Base           Current Medications (04/23/2020):  This is the current hospital active medication list Current Facility-Administered Medications  Medication Dose Route Frequency Provider Last Rate Last Admin  . 0.9 %  sodium chloride infusion   Intravenous Continuous Lanae Crumbly, PA-C 85 mL/hr at 04/21/20 1705 New Bag at 04/21/20 1705  . acetaminophen (TYLENOL) tablet 325-650 mg  325-650 mg Oral Q6H PRN Lanae Crumbly, PA-C   650 mg at 04/22/20 1549  . azaTHIOprine (IMURAN) tablet 50 mg  50 mg Oral BID Lanae Crumbly, PA-C   50 mg at 04/23/20 3762  . buPROPion (WELLBUTRIN XL) 24 hr tablet 150 mg  150 mg Oral Daily Lanae Crumbly, PA-C   150 mg at 04/23/20 8315  . busPIRone (BUSPAR) tablet 10 mg  10 mg Oral BID Ricard Dillon,  Alyson Locket, PA-C   10 mg at 04/23/20 0924  . docusate sodium (COLACE) capsule 100 mg  100 mg Oral BID Lanae Crumbly, PA-C   100 mg at 04/23/20 0998  . fluticasone (FLONASE) 50 MCG/ACT nasal spray 2 spray  2 spray Each Nare Daily Lanae Crumbly, PA-C   2 spray at 04/23/20 3382  . furosemide (LASIX) tablet 40 mg  40 mg Oral Daily Lanae Crumbly, PA-C   40 mg at 04/23/20 5053  . ipratropium (ATROVENT) 0.06 % nasal spray 1 spray  1 spray Nasal Daily PRN Lanae Crumbly, PA-C       . levalbuterol Penne Lash) nebulizer solution 0.63 mg  0.63 mg Inhalation Q4H PRN Benjiman Core M, PA-C      . levothyroxine (SYNTHROID) tablet 50 mcg  50 mcg Oral QAC breakfast Lanae Crumbly, PA-C   50 mcg at 04/23/20 0515  . menthol-cetylpyridinium (CEPACOL) lozenge 3 mg  1 lozenge Oral PRN Lanae Crumbly, PA-C       Or  . phenol (CHLORASEPTIC) mouth spray 1 spray  1 spray Mouth/Throat PRN Lanae Crumbly, PA-C      . methocarbamol (ROBAXIN) tablet 500 mg  500 mg Oral Q6H PRN Lanae Crumbly, PA-C   500 mg at 04/23/20 1348   Or  . methocarbamol (ROBAXIN) 500 mg in dextrose 5 % 50 mL IVPB  500 mg Intravenous Q6H PRN Lanae Crumbly, PA-C      . metoCLOPramide (REGLAN) tablet 5-10 mg  5-10 mg Oral Q8H PRN Lanae Crumbly, PA-C       Or  . metoCLOPramide (REGLAN) injection 5-10 mg  5-10 mg Intravenous Q8H PRN Benjiman Core M, PA-C      . metoprolol succinate (TOPROL-XL) 24 hr tablet 25 mg  25 mg Oral Daily Benjiman Core M, PA-C   25 mg at 04/23/20 9767  . mometasone-formoterol (DULERA) 200-5 MCG/ACT inhaler 2 puff  2 puff Inhalation BID Lanae Crumbly, PA-C   2 puff at 04/23/20 3419  . ondansetron (ZOFRAN) tablet 4 mg  4 mg Oral Q6H PRN Lanae Crumbly, PA-C       Or  . ondansetron [facility]) injection 4 mg  4 mg Intravenous Q6H PRN Lanae Crumbly, PA-C      . oxyCODONE (Oxy IR/ROXICODONE) immediate release tablet 5-10 mg  5-10 mg Oral Q4H PRN [name], MD   10 mg at 04/23/20 1348  . pantoprazole (PROTONIX) EC tablet 40 mg  40 mg Oral Daily Lanae Crumbly, PA-C   40 mg at 04/23/20 3790  . potassium chloride (KLOR-CON) CR tablet 10 mEq  10 mEq Oral Daily Lanae Crumbly, PA-C   10 mEq at 04/23/20 2409  . pravastatin (PRAVACHOL) tablet 40 mg  40 mg Oral Daily Lanae Crumbly, PA-C   40 mg at 04/23/20 7353  . prednisoLONE acetate (PRED FORTE) 1 % ophthalmic suspension 1 drop  1 drop Right Eye Daily Lanae Crumbly, PA-C   1 drop at 04/23/20 2992  . rivaroxaban (XARELTO) tablet 20 mg  20 mg Oral Q breakfast  Lanae Crumbly, PA-C   20 mg at 04/23/20 4268     Discharge Medications: Please see discharge summary for a list of discharge medications.  Relevant Imaging Results:  Relevant Lab Results:   Additional Information SS # [id number]  Sharin Mons, RN

## 2020-04-23 NOTE — Progress Notes (Signed)
Pt refused to be put on CPM. Pt agrees to have it on in the morning. Will continue to monitor.

## 2020-04-23 NOTE — Discharge Summary (Signed)
Patient ID: Jennifer Foley MRN: [id number] DOB/AGE: [birth year] 64 y.o.  Admit date: 04/21/2020 Discharge date: 04/23/2020  Admission Diagnoses:  Active Problems:   Arthritis of left knee   Total knee replacement status   Discharge Diagnoses:  Active Problems:   Arthritis of left knee   Total knee replacement status  status post Procedure(s): LEFT TOTAL KNEE ARTHROPLASTY  Past Medical History:  Diagnosis Date   Anemia    Arthritis    per patient, in left and right knee and both hands   Atrial fibrillation (HCC)    Bilateral breast cysts    CHF (congestive heart failure) (HCC)    Chronic kidney disease    stage 3   Depression with anxiety    DM (diabetes mellitus) (HCC)    Esophageal reflux    History of hiatal hernia    HTN (hypertension)    Hyperlipemia    Hyperthyroidism    Hypothyroidism    INR (international normal ratio) abnormal    MRSA (methicillin resistant staph aureus) culture positive    Panuveitis    Pneumonia    PTSD (post-traumatic stress disorder)    Sleep apnea    per patient has CPAP, wears it at night sometimes    Surgeries: Procedure(s): LEFT TOTAL KNEE ARTHROPLASTY on 04/21/2020   Consultants:   Discharged Condition: Improved  Hospital Course: Jennifer Foley is an 64 y.o. female who was admitted 04/21/2020 for operative treatment of left knee DJD. Patient failed conservative treatments (please see the history and physical for the specifics) and had severe unremitting pain that affects sleep, daily activities and work/hobbies. After pre-op clearance, the patient was taken to the operating room on 04/21/2020 and underwent  Procedure(s): LEFT TOTAL KNEE ARTHROPLASTY.    Patient was given perioperative antibiotics:  Anti-infectives (From admission, onward)   Start     Dose/Rate Route Frequency Ordered Stop   04/21/20 1100  ceFAZolin (ANCEF) IVPB 2g/100 mL premix        2 g 200 mL/hr over 30 Minutes Intravenous  Once  04/21/20 1053 04/21/20 1326   04/21/20 1054  ceFAZolin (ANCEF) 2-4 GM/100ML-% IVPB       Note to Pharmacy: Tamsen Snider   : cabinet override      04/21/20 1054 04/21/20 1316       Patient was given sequential compression devices and early ambulation to prevent DVT.   Patient benefited maximally from hospital stay and there were no complications.  April 23, 2020 patient seen by me. Patient crying upon me entering room today.  Complains of increased pain left knee and left leg.  Denies chest pain shortness of breath.  Patient has done minimal ambulation in her room yesterday.  She has been refusing to use CPM due to left knee pain.  At home patient only has a daughter around to assist.  Assessment/Plan: -Ordered stat venous Doppler to rule out left lower extremity DVT. -Patient progressing very slow and does not have much assistance at home.  I think she would benefit more from skilled nursing facility placement for rehab.  Spoke with therapist this morning and they stated that patient did not do so well yesterday We will get social worker to arrange placement.        Recent vital signs:  Patient Vitals for the past 24 hrs:  BP Temp Temp src Pulse Resp SpO2  04/23/20 0945 (!) 144/65 98.3 F (36.8 C) Oral 80 16 100 %  04/23/20 0739 -- -- -- -- -- 97 %  04/23/20 0454 121/70 98.7 F (37.1 C) Oral 75 17 99 %  04/22/20 2007 (!) 114/54 98.9 F (37.2 C) Oral 80 17 98 %  04/22/20 1557 114/68 97.9 F (36.6 C) Oral 69 -- 99 %     Recent laboratory studies:  Recent Labs    04/21/20 1024 04/22/20 0415 04/23/20 0230  WBC  --  10.9* 12.4*  HGB  --  10.7* 10.1*  HCT  --  34.4* 32.2*  PLT  --  212 175  NA  --  140  --   K  --  4.8  --   CL  --  104  --   CO2  --  27  --   BUN  --  13  --   CREATININE  --  1.42*  --   GLUCOSE  --  150*  --   INR 1.0  --   --   CALCIUM  --  8.6*  --      Discharge Medications:   Allergies as of 04/23/2020      Reactions   Contrast Media [iodinated  Diagnostic Agents] Other (See Comments)   Unknown   Doxycycline Other (See Comments)   Unknown   Albuterol Rash   Pt states she had a rash from it once at [facility]   Albuterol Sulfate Rash   Amoxicillin Rash   Codeine Rash   Erythromycin Base Diarrhea, Rash      Medication List    STOP taking these medications   acetaminophen 500 MG tablet Commonly known as: TYLENOL     TAKE these medications   Adalimumab 40 MG/0.8ML Pnkt Inject 40 mg into the skin every 14 (fourteen) days.   ammonium lactate 12 % lotion Commonly known as: LAC-HYDRIN Apply 1 application topically daily as needed for dry skin.   azaTHIOprine 50 MG tablet Commonly known as: IMURAN Take 50 mg by mouth 2 (two) times daily.   buPROPion 150 MG 24 hr tablet Commonly known as: WELLBUTRIN XL Take 150 mg by mouth daily.   busPIRone 10 MG tablet Commonly known as: BUSPAR Take 10 mg by mouth 2 (two) times daily.   fluticasone 50 MCG/ACT nasal spray Commonly known as: FLONASE Place 2 sprays into both nostrils daily.   furosemide 40 MG tablet Commonly known as: LASIX Take 40 mg by mouth daily.   ipratropium 0.03 % nasal spray Commonly known as: ATROVENT Place 1 spray into the nose daily as needed (rhinitis).   levalbuterol 45 MCG/ACT inhaler Commonly known as: XOPENEX HFA Inhale 2 puffs into the lungs every 4 (four) hours as needed for wheezing or shortness of breath.   levothyroxine 50 MCG tablet Commonly known as: SYNTHROID Take 50 mcg by mouth daily before breakfast.   methocarbamol 500 MG tablet Commonly known as: ROBAXIN Take 1 tablet (500 mg total) by mouth every 6 (six) hours as needed for muscle spasms.   metoprolol succinate 25 MG 24 hr tablet Commonly known as: TOPROL-XL Take 25 mg by mouth daily.   omeprazole 20 MG capsule Commonly known as: PRILOSEC Take 20 mg by mouth daily.   oxyCODONE-acetaminophen 5-325 MG tablet Commonly known as: PERCOCET/ROXICET Take 1-2 tablets by mouth every 4  (four) hours as needed for severe pain.   potassium chloride 10 MEQ tablet Commonly known as: KLOR-CON Take 10 mEq by mouth daily.   pravastatin 40 MG tablet Commonly known as: PRAVACHOL Take 40 mg by mouth daily.   prednisoLONE acetate 1 % ophthalmic suspension Commonly  known as: PRED FORTE Place 1 drop into the right eye in the morning and at bedtime.   rivaroxaban 20 MG Tabs tablet Commonly known as: XARELTO Take 20 mg by mouth every evening.   Symbicort 160-4.5 MCG/ACT inhaler Generic drug: budesonide-formoterol Inhale 2 puffs into the lungs daily as needed (shortness of breath).            Durable Medical Equipment  (From admission, onward)         Start     Ordered   04/22/20 1505  For home use only DME 3 n 1  Once        04/22/20 1505   04/22/20 1505  For home use only DME Walker rolling  Once       Question Answer Comment  Walker: With 5 Inch Wheels   Patient needs a walker to treat with the following condition Weakness      04/22/20 1505          Diagnostic Studies: DG Knee 1-2 Views Left  Result Date: 04/21/2020 CLINICAL DATA:  Postop EXAM: LEFT KNEE - 1-2 VIEW COMPARISON:  03/09/2020 FINDINGS: Changes of left knee replacement. No hardware bony complicating feature. Soft tissue and joint space gas noted. IMPRESSION: Left knee replacement.  No visible complicating feature. Electronically Signed   By: Rolm Baptise M.D.   On: 04/21/2020 16:37       Follow-up Information    [name], MD Follow up in 1 week(s).   Specialty: Orthopedic Surgery Contact information: [place] [address] [phone]        Care, [facility] Follow up.   Specialty: [place] Why: home health services arranged, start of care 04/28/2020 Contact information: [address] [place] [address] [phone]               Discharge Plan:  discharge to skilled nursing facility  Disposition:      Signed: Benjiman Core for Rodell Perna MD [phone] 04/23/2020, 12:00 PM

## 2020-04-23 NOTE — Progress Notes (Signed)
Subjective: Patient crying upon me entering room today.  Complains of increased pain left knee and left leg.  Denies chest pain shortness of breath.  Patient has done minimal ambulation in her room yesterday.  She has been refusing to use CPM due to left knee pain.  At home patient only has a daughter around to assist.   Objective: Vital signs in last 24 hours: Temp:  [97.9 F (36.6 C)-98.9 F (37.2 C)] 98.3 F (36.8 C) (07/02 0945) Pulse Rate:  [69-80] 80 (07/02 0945) Resp:  [16-17] 16 (07/02 0945) BP: (114-144)/(54-70) 144/65 (07/02 0945) SpO2:  [97 %-100 %] 100 % (07/02 0945)  Intake/Output from previous day: 07/01 0701 - 07/02 0700 In: -  Out: 400 [Urine:400] Intake/Output this shift: No intake/output data recorded.  Recent Labs    04/22/20 0415 04/23/20 0230  HGB 10.7* 10.1*   Recent Labs    04/22/20 0415 04/23/20 0230  WBC 10.9* 12.4*  RBC 3.68* 3.48*  HCT 34.4* 32.2*  PLT 212 175   Recent Labs    04/22/20 0415  NA 140  K 4.8  CL 104  CO2 27  BUN 13  CREATININE 1.42*  GLUCOSE 150*  CALCIUM 8.6*   Recent Labs    04/21/20 1024  INR 1.0    Exam Patient tearful and audible crying due to left leg pain.  Left knee wound looks good.  No drainage or signs of infection.  Knee has marked tenderness.  Also has marked tenderness of the left calf and around her thigh.  Left lower extremity swelling.  Neurologically intact.  Left pedal pulses are diminished likely due to the swelling.  Right lower extremity unremarkable.    Assessment/Plan: -Ordered stat venous Doppler to rule out left lower extremity DVT. -Patient progressing very slow and does not have much assistance at home.  I think she would benefit more from skilled nursing facility placement for rehab.  Spoke with therapist this morning and they stated that patient did not do so well yesterday.      Benjiman Core 04/23/2020, 10:13 AM

## 2020-04-23 NOTE — TOC Progression Note (Signed)
Transition of Care [facility]) - Progression Note    Patient Details  Name: Jennifer Foley MRN: [id number] Date of Birth: January 28, 1956  [facility] CM/SW Contact  Sharin Mons, RN Phone Number: 04/23/2020, 2:09 PM  Clinical Narrative:    SNF workup completed. Awaiting SNF bed offers. Insurance authorization pending.....  TOC  Team will continue to monitor and follow.... Expected Discharge Plan: Skilled Nursing Facility Barriers to Discharge: No SNF bed, Insurance Authorization  Expected Discharge Plan and Services Expected Discharge Plan: [place]   Discharge Planning Services: CM Consult                     DME Arranged: 3-N-1, Walker rolling DME Agency: [employer] Date DME Agency Contacted: 04/22/20 Time DME Agency Contacted: [phone] Representative spoke with at DME Agency: [place]: PT [place]: [place] Date [place]: 04/22/20 Time [place]: 1653 Representative spoke with at [place]: [place] Determinants of Health ([place]) Interventions    Readmission Risk Interventions No flowsheet data found.

## 2020-04-23 NOTE — Plan of Care (Signed)
  Problem: Safety: Goal: Ability to remain free from injury will improve Outcome: Progressing   

## 2020-04-23 NOTE — Progress Notes (Signed)
Left lower extremity venous duplex completed. Refer to "CV Proc" under chart review to view preliminary results.  04/23/2020 2:14 PM Kelby Aline., MHA, RVT, RDCS, RDMS

## 2020-04-23 NOTE — Progress Notes (Signed)
Physical Therapy Treatment Patient Details Name: Jennifer Foley MRN: [id number] DOB: 21-Mar-1956 Today's Date: 04/23/2020    History of Present Illness Pt is a 64 y.o. F presents s/p elective L TKA 04/21/20.Significant PMH of atrial fibrillation, CHF, CKD, HLD    PT Comments    Pt presented in recliner chair at start of session requesting to get up to use the restroom. Pt able to sit to stand mod(A) and amb to bathroom min guard assist. Pt able to amb from restroom to bed with one seated rest break and then amb 15' to assist with increasing tolerance to gait distance. Pt remains limited by pain and fear of mobility. Discussed d/c recommendations for SNF with daughter and patient, both agreed. Will continue to follow acutely.   Follow Up Recommendations  SNF     Equipment Recommendations  Rolling walker with 5" wheels;3in1 (PT)    Recommendations for Other Services       Precautions / Restrictions Precautions Precautions: Fall Restrictions Weight Bearing Restrictions: Yes LLE Weight Bearing: Weight bearing as tolerated    Mobility  Bed Mobility Overal bed mobility: Needs Assistance       Sit to supine: Mod assist   General bed mobility comments: Mod(A) for lifting both LE  Transfers Overall transfer level: Needs assistance Equipment used: Rolling walker (2 wheeled) Transfers: Sit to/from Stand Sit to Stand: Mod assist         General transfer comment: Required mod(A) to boost up from bed due to pain and required cues for hand placement and use of RW. Increased time. Sit to stand x2, pt squat to stand with using the toliet because she "did not want to sit down".  Ambulation/Gait Ambulation/Gait assistance: Min guard Gait Distance (Feet): 35 Feet Assistive device: Rolling walker (2 wheeled) Gait Pattern/deviations: Step-to pattern;Decreased stride length;Decreased weight shift to left;Antalgic;Trunk flexed Gait velocity: decreased   General Gait Details: Amb 10',  10' 15' with seated rest breaks in between. Pt required cues for sequencing/direction, min guard with mobility. Pt maintained WB precautions throughout session.   Stairs             Wheelchair Mobility    Modified Rankin (Stroke Patients Only)       Balance Overall balance assessment: Needs assistance Sitting-balance support: Feet unsupported Sitting balance-Leahy Scale: Good     Standing balance support: Bilateral upper extremity supported Standing balance-Leahy Scale: Poor Standing balance comment: reliant on external support                            Cognition Arousal/Alertness: Awake/alert Behavior During Therapy: WFL for tasks assessed/performed Overall Cognitive Status: Within Functional Limits for tasks assessed                                 General Comments: Pt cognition WNL, pt visably crying and upset intermittantly throughout session. Pt alert and oriented, indicated that she was in alot of pain but willing to complete therapy. Pt immediatly stopped crying when daughter came into room, pt more talkative with daughter present.      Exercises     General Comments General comments (skin integrity, edema, etc.): Pt crying during session intermittantly, pt able to take larger steps during session with greater mobility. Pt daughter present at end of session and agreed with rehab recommendation. Pt stopped crying when daughter came in room.  Pertinent Vitals/Pain Pain Assessment: Faces Pain Score: 10-Worst pain ever Faces Pain Scale: Hurts worst Pain Location: With mobility Pain Descriptors / Indicators: Grimacing;Operative site guarding;Crying;Aching;Pressure Pain Intervention(s): Limited activity within patient's tolerance;Monitored during session;Ice applied;Patient requesting pain meds-RN notified    Home Living                      Prior Function            PT Goals (current goals can now be found in the  care plan section) Acute Rehab PT Goals Patient Stated Goal: less pain PT Goal Formulation: With patient Time For Goal Achievement: 05/06/20 Potential to Achieve Goals: Good Progress towards PT goals: Progressing toward goals    Frequency    7X/week      PT Plan Current plan remains appropriate    Co-evaluation              AM-PAC PT "6 Clicks" Mobility   Outcome Measure  Help needed turning from your back to your side while in a flat bed without using bedrails?: None Help needed moving from lying on your back to sitting on the side of a flat bed without using bedrails?: A Little Help needed moving to and from a bed to a chair (including a wheelchair)?: A Little Help needed standing up from a chair using your arms (e.g., wheelchair or bedside chair)?: A Lot Help needed to walk in hospital room?: A Lot Help needed climbing 3-5 steps with a railing? : A Lot 6 Click Score: 16    End of Session Equipment Utilized During Treatment: Gait belt Activity Tolerance: Patient limited by pain Patient left: with call bell/phone within reach;in chair;with chair alarm set Nurse Communication: Mobility status PT Visit Diagnosis: Other abnormalities of gait and mobility (R26.89);Difficulty in walking, not elsewhere classified (R26.2);Pain Pain - Right/Left: Left Pain - part of body: Knee     Time: 5638-9373 PT Time Calculation (min) (ACUTE ONLY): 38 min  Charges:  $Gait Training: 23-37 mins $Therapeutic Activity: 8-22 mins                     [employer] SPT 04/23/2020    Rolland Porter 04/23/2020, 2:27 PM

## 2020-04-23 NOTE — Progress Notes (Signed)
Physical Therapy Treatment Patient Details Name: Jennifer Foley MRN: [id number] DOB: 19-Jun-1956 Today's Date: 04/23/2020    History of Present Illness Pt is a 64 y.o. F presents s/p elective L TKA 04/21/20.Significant PMH of atrial fibrillation, CHF, CKD, HLD    PT Comments    Pt presented in bed, visibly upset, which she indicated was due to her pain. Pt able to complete bed mobility min(A) with assistance for LE movement and trunk elevation. Pt able to sit to stand mod(A) for power up and transfer to chair min(A) for assistance with RW. Pt was given choice between completing therex now and amb later or visa versa, pt reported she would be willing to amb after lunch. Pt taught LE therex to complete in chair. Discussed d/c recommendations for rehab, pt reported she just communicated with PA regarding rehab and is willing to go. Will continue to follow acutely until d/c to next venue of care.   Follow Up Recommendations  SNF     Equipment Recommendations  Rolling walker with 5" wheels;3in1 (PT)    Recommendations for Other Services       Precautions / Restrictions Precautions Precautions: Fall Restrictions Weight Bearing Restrictions: Yes LLE Weight Bearing: Weight bearing as tolerated    Mobility  Bed Mobility Overal bed mobility: Needs Assistance Bed Mobility: Supine to Sit     Supine to sit: Min assist     General bed mobility comments: Min(A) for assistance with LE and elevation of trunk  Transfers Overall transfer level: Needs assistance Equipment used: Rolling walker (2 wheeled) Transfers: Sit to/from Stand Sit to Stand: Mod assist         General transfer comment: Required mod(A) to boost up from bed due to pain and required cues for hand placement and use of RW. Increased time.  Ambulation/Gait Ambulation/Gait assistance: Min assist Gait Distance (Feet): 5 Feet Assistive device: Rolling walker (2 wheeled) Gait Pattern/deviations: Step-to pattern;Decreased  stride length;Decreased weight shift to left;Antalgic;Trunk flexed Gait velocity: decreased   General Gait Details: Cues for sequencing/direction, min(A) for assistance with RW.   Stairs             Wheelchair Mobility    Modified Rankin (Stroke Patients Only)       Balance Overall balance assessment: Needs assistance Sitting-balance support: Feet unsupported Sitting balance-Leahy Scale: Good     Standing balance support: Bilateral upper extremity supported Standing balance-Leahy Scale: Poor Standing balance comment: reliant on external support                            Cognition Arousal/Alertness: Awake/alert Behavior During Therapy: WFL for tasks assessed/performed Overall Cognitive Status: Within Functional Limits for tasks assessed                                 General Comments: Pt cognition WNL, pt visably crying and upset intermittantly throughout session. Pt alert and oriented, indicated that she was in alot of pain but willing to complete therapy.      Exercises Total Joint Exercises Goniometric ROM: Supine AROM knee flexion 50 degrees (pt unable to go further actively due to pain), extension lacking 10 degrees. General Exercises - Lower Extremity Ankle Circles/Pumps: AROM;Both;10 reps;Seated Quad Sets: AROM;Left;10 reps;Seated Heel Slides: AAROM;Left;10 reps;Seated Straight Leg Raises: AAROM;Seated;Left;10 reps    General Comments General comments (skin integrity, edema, etc.): PA in session prior to PT session, PA  communicated with PT about pt needing to potentially go to rehab, PT agreed. Pt upset intermittantly throughout session. Pt did well with redirection and reports she likes to bowl.      Pertinent Vitals/Pain Pain Assessment: 0-10 Pain Score: 10-Worst pain ever Pain Location: With mobility Pain Descriptors / Indicators: Grimacing;Operative site guarding;Crying;Aching;Pressure Pain Intervention(s): Limited  activity within patient's tolerance;Monitored during session;Repositioned;Ice applied    Home Living                      Prior Function            PT Goals (current goals can now be found in the care plan section) Acute Rehab PT Goals Patient Stated Goal: less pain PT Goal Formulation: With patient Time For Goal Achievement: 05/06/20 Potential to Achieve Goals: Good Progress towards PT goals: Progressing toward goals    Frequency    7X/week      PT Plan Discharge plan needs to be updated;Frequency needs to be updated    Co-evaluation              AM-PAC PT "6 Clicks" Mobility   Outcome Measure  Help needed turning from your back to your side while in a flat bed without using bedrails?: None Help needed moving from lying on your back to sitting on the side of a flat bed without using bedrails?: A Little Help needed moving to and from a bed to a chair (including a wheelchair)?: A Little Help needed standing up from a chair using your arms (e.g., wheelchair or bedside chair)?: A Lot Help needed to walk in hospital room?: A Lot Help needed climbing 3-5 steps with a railing? : A Lot 6 Click Score: 16    End of Session Equipment Utilized During Treatment: Gait belt Activity Tolerance: Patient limited by pain Patient left: with call bell/phone within reach;in chair;with chair alarm set Nurse Communication: Mobility status PT Visit Diagnosis: Other abnormalities of gait and mobility (R26.89);Difficulty in walking, not elsewhere classified (R26.2);Pain Pain - Right/Left: Left Pain - part of body: Knee     Time: 1025-8527 PT Time Calculation (min) (ACUTE ONLY): 30 min  Charges:  $Therapeutic Exercise: 8-22 mins $Therapeutic Activity: 8-22 mins                     Jennifer Foley SPT 04/23/2020    Rolland Porter 04/23/2020, 12:30 PM

## 2020-04-23 NOTE — Plan of Care (Signed)
°  Problem: Education: °Goal: Knowledge of General Education information will improve °Description: Including pain rating scale, medication(s)/side effects and non-pharmacologic comfort measures °Outcome: Progressing °  °Problem: Coping: °Goal: Level of anxiety will decrease °Outcome: Progressing °  °Problem: Elimination: °Goal: Will not experience complications related to bowel motility °Outcome: Progressing °  °Problem: Pain Managment: °Goal: General experience of comfort will improve °Outcome: Progressing °  °Problem: Safety: °Goal: Ability to remain free from injury will improve °Outcome: Progressing °  °Problem: Skin Integrity: °Goal: Risk for impaired skin integrity will decrease °Outcome: Progressing °  °

## 2020-04-23 NOTE — Plan of Care (Addendum)
Placed pt on CPM 0000-0200, pt did not tolerate well at first, medicated her with PRN oxy and educated her on needing to use the CPM to prevent her knee from stiffening up. Pt tolerated last hour of CPM well. Will continue to monitor.   Pt refused CPM in the am. Educated pt to get on CPM once next PRN pain med was due. Pt ambulated well to the [facility]  Problem: Education: Goal: Knowledge of General Education information will improve Description: Including pain rating scale, medication(s)/side effects and non-pharmacologic comfort measures Outcome: Progressing   Problem: Activity: Goal: Risk for activity intolerance will decrease Outcome: Progressing   Problem: Pain Managment: Goal: General experience of comfort will improve Outcome: Progressing   Problem: Safety: Goal: Ability to remain free from injury will improve Outcome: Progressing   Problem: Skin Integrity: Goal: Risk for impaired skin integrity will decrease Outcome: Progressing

## 2020-04-24 LAB — CBC
HCT: 31.2 % — ABNORMAL LOW (ref 36.0–46.0)
Hemoglobin: 9.7 g/dL — ABNORMAL LOW (ref 12.0–15.0)
MCH: 29 pg (ref 26.0–34.0)
MCHC: 31.1 g/dL (ref 30.0–36.0)
MCV: 93.1 fL (ref 80.0–100.0)
Platelets: 190 10*3/uL (ref 150–400)
RBC: 3.35 MIL/uL — ABNORMAL LOW (ref 3.87–5.11)
RDW: 13.7 % (ref 11.5–15.5)
WBC: 10.8 10*3/uL — ABNORMAL HIGH (ref 4.0–10.5)
nRBC: 0 % (ref 0.0–0.2)

## 2020-04-24 NOTE — Progress Notes (Signed)
Placed on CPM- tolerated approx 1.5hrs but could not tolerate flexion increase from 70 to 80* - after placed in immobilizer and bone foam

## 2020-04-24 NOTE — Progress Notes (Signed)
Patient ID: Jennifer Foley, female   DOB: 1956/01/11, 64 y.o.   MRN: [id number] Making slow progress with physical therapy will need discharge to skilled nursing.  Placement offers pending.  Patient has been resistant to using the CPM.  Ultrasound negative for DVT.

## 2020-04-24 NOTE — TOC Progression Note (Addendum)
Transition of Care [facility]) - Progression Note    Patient Details  Name: Jennifer Foley MRN: [id number] Date of Birth: 04-12-1956  Transition of Care [facility]) CM/SW [place], [place] Phone Number: 04/24/2020, 11:50 AM  Clinical Narrative:    11:50am-CSW spoke with patient regarding SNF choice. She reported that she had not had a chance to review the list. She asked about [facility] since [facility] is near her apartment and requested CSW contact her daughter.    1pm-CSW spoke with her daughter Jonita Albee [phone]) and provided bed offers. She has selected [facility]. CSW will confirm with patient and begin insurance authorization.   1:30pm-CSW discussed facility with the patient. She reported agreement with [facility]. CSW awaiting call back from insurance to submit the facility name. Will request an updated COVID test from MD (left vm).   1:55pm-CSW received insurance approval for [employer]: Ref #[id number] effective through 04/27/20. Patient able to discharge tomorrow pending COVID test.      Expected Discharge Plan: Skilled Nursing Facility Barriers to Discharge: No SNF bed, Insurance Authorization  Expected Discharge Plan and Services Expected Discharge Plan: [place]   Discharge Planning Services: CM Consult                     DME Arranged: 3-N-1, Walker rolling DME Agency: [employer] Date DME Agency Contacted: 04/22/20 Time DME Agency Contacted: [phone] Representative spoke with at DME Agency: [place]: PT [place]: [place] Date [place]: 04/22/20 Time [place]: 1653 Representative spoke with at [place]: [place] Determinants of Health ([place]) Interventions    Readmission Risk Interventions No flowsheet data found.

## 2020-04-24 NOTE — Plan of Care (Signed)
  Problem: Education: Goal: Knowledge of General Education information will improve Description Including pain rating scale, medication(s)/side effects and non-pharmacologic comfort measures Outcome: Progressing   Problem: Health Behavior/Discharge Planning: Goal: Ability to manage health-related needs will improve Outcome: Progressing   

## 2020-04-24 NOTE — Progress Notes (Signed)
Physical Therapy Treatment Patient Details Name: Jennifer Foley MRN: [id number] DOB: 07/05/1956 Today's Date: 04/24/2020    History of Present Illness Pt is a 64 y.o. F presents s/p elective L TKA 04/21/20.Significant PMH of atrial fibrillation, CHF, CKD, HLD    PT Comments    Pt making progress slowly. Seems to feel that L knee is very fragile so discussed surgery and LLE's ability to hold her up in standing and tolerate wt. Also showed her how to use gait belt to move her LLE so that she can have more control with mobility. These things seemed to help her feel less anxious and be motivated to mobilize. Pt ambulated 72' with RW and no rest breaks. Worked on letting L knee bend during swing through. PT will continue to follow.    Follow Up Recommendations  SNF     Equipment Recommendations  Rolling walker with 5" wheels;3in1 (PT)    Recommendations for Other Services       Precautions / Restrictions Precautions Precautions: Fall Restrictions Weight Bearing Restrictions: Yes LLE Weight Bearing: Weight bearing as tolerated    Mobility  Bed Mobility Overal bed mobility: Needs Assistance Bed Mobility: Supine to Sit     Supine to sit: Min assist     General bed mobility comments: pt very worried about LLE being "dropped" so showed pt how to use RLE to assist L as well as gait belt as a strap to move LLE and pt able to come to EOB with min A only for initiation of mvmt  Transfers Overall transfer level: Needs assistance Equipment used: Rolling walker (2 wheeled) Transfers: Sit to/from Stand Sit to Stand: Min assist         General transfer comment: min A for power up, vc's to push down through RLE and allow LLE to assist as well.   Ambulation/Gait Ambulation/Gait assistance: Min guard Gait Distance (Feet): 35 Feet Assistive device: Rolling walker (2 wheeled) Gait Pattern/deviations: Step-to pattern;Decreased stride length;Decreased weight shift to left;Antalgic Gait  velocity: decreased Gait velocity interpretation: <1.31 ft/sec, indicative of household ambulator General Gait Details: 74' without rest breaks. vc's for letting L knee bend with swing through.    Stairs             Wheelchair Mobility    Modified Rankin (Stroke Patients Only)       Balance Overall balance assessment: Needs assistance Sitting-balance support: Feet unsupported Sitting balance-Leahy Scale: Good     Standing balance support: Bilateral upper extremity supported Standing balance-Leahy Scale: Poor Standing balance comment: reliant on external support                            Cognition Arousal/Alertness: Awake/alert Behavior During Therapy: WFL for tasks assessed/performed Overall Cognitive Status: Within Functional Limits for tasks assessed                                 General Comments: pt did better after we talked about her surgery and how she has a new knee that will work for her and will hold her up while ambulating and that the more she mobilizes the easier it will get      Exercises Total Joint Exercises Ankle Circles/Pumps: Both;10 reps;Supine Quad Sets: Left;10 reps;Supine Gluteal Sets: AROM;Both;10 reps;Supine Heel Slides: Left;5 reps;Seated;AAROM Hip ABduction/ADduction: AAROM;Left;10 reps;Supine Straight Leg Raises: AAROM;Left;10 reps;Supine Long Arc Quad: AAROM;Left;10 reps;Seated Goniometric ROM:  15-60    General Comments General comments (skin integrity, edema, etc.): pt still limited by pain but tolerated better emtionally. Instructed to ambulate into the bathroom with assist instead of using bed pan and to work on managing her leg on her own so she feels less out of control      Pertinent Vitals/Pain Pain Assessment: 0-10 Faces Pain Scale: Hurts even more Pain Location: L knee Pain Descriptors / Indicators: Grimacing;Operative site guarding;Aching;Sore Pain Intervention(s): Limited activity within  patient's tolerance;Monitored during session    Home Living                      Prior Function            PT Goals (current goals can now be found in the care plan section) Acute Rehab PT Goals Patient Stated Goal: less pain PT Goal Formulation: With patient Time For Goal Achievement: 05/06/20 Potential to Achieve Goals: Good Progress towards PT goals: Progressing toward goals    Frequency    7X/week      PT Plan Current plan remains appropriate    Co-evaluation              AM-PAC PT "6 Clicks" Mobility   Outcome Measure  Help needed turning from your back to your side while in a flat bed without using bedrails?: None Help needed moving from lying on your back to sitting on the side of a flat bed without using bedrails?: A Little Help needed moving to and from a bed to a chair (including a wheelchair)?: A Little Help needed standing up from a chair using your arms (e.g., wheelchair or bedside chair)?: A Little Help needed to walk in hospital room?: A Little Help needed climbing 3-5 steps with a railing? : Total 6 Click Score: 17    End of Session Equipment Utilized During Treatment: Gait belt Activity Tolerance: Patient limited by pain;Patient tolerated treatment well Patient left: with call bell/phone within reach;in chair;with chair alarm set Nurse Communication: Mobility status PT Visit Diagnosis: Other abnormalities of gait and mobility (R26.89);Difficulty in walking, not elsewhere classified (R26.2);Pain Pain - Right/Left: Left Pain - part of body: Knee     Time: 5638-7564 PT Time Calculation (min) (ACUTE ONLY): 32 min  Charges:  $Gait Training: 8-22 mins $Therapeutic Exercise: 8-22 mins                     Leighton Roach, PT  Acute Rehab Services  Pager [phone] Office [place] 04/24/2020, 1:29 PM

## 2020-04-25 LAB — SARS CORONAVIRUS 2 (TAT 6-24 HRS): SARS Coronavirus 2: NEGATIVE

## 2020-04-25 MED ORDER — OXYCODONE-ACETAMINOPHEN 5-325 MG PO TABS: 1.0000 | ORAL_TABLET | ORAL | 0 refills | Status: DC | PRN

## 2020-04-25 NOTE — Discharge Summary (Signed)
Discharge Diagnoses:  Active Problems:   Arthritis of left knee   Total knee replacement status   Surgeries: Procedure(s): LEFT TOTAL KNEE ARTHROPLASTY on 04/21/2020    Consultants:   Discharged Condition: Improved  Hospital Course: Jennifer Foley is an 64 y.o. female who was admitted 04/21/2020 with a chief complaint of osteoarthritis left knee, with a final diagnosis of left knee osteoarthritis.  Patient was brought to the operating room on 04/21/2020 and underwent Procedure(s): LEFT TOTAL KNEE ARTHROPLASTY.    Patient was given perioperative antibiotics:  Anti-infectives (From admission, onward)   Start     Dose/Rate Route Frequency Ordered Stop   04/21/20 1100  ceFAZolin (ANCEF) IVPB 2g/100 mL premix        2 g 200 mL/hr over 30 Minutes Intravenous  Once 04/21/20 1053 04/21/20 1326   04/21/20 1054  ceFAZolin (ANCEF) 2-4 GM/100ML-% IVPB       Note to Pharmacy: Tamsen Snider   : cabinet override      04/21/20 1054 04/21/20 1316    .  Patient was given sequential compression devices, early ambulation, and aspirin for DVT prophylaxis.  Recent vital signs:  Patient Vitals for the past 24 hrs:  BP Temp Temp src Pulse Resp SpO2  04/25/20 0815 -- -- -- -- -- 99 %  04/25/20 0732 118/70 98.3 F (36.8 C) Oral 76 16 100 %  04/25/20 0543 119/61 98.5 F (36.9 C) Oral 79 16 98 %  04/24/20 2032 -- -- -- -- -- 94 %  04/24/20 2017 121/60 99 F (37.2 C) Oral 78 16 97 %  04/24/20 1728 (!) 101/52 99.5 F (37.5 C) Oral 79 16 100 %  .  Recent laboratory studies: VAS [place] LOWER EXTREMITY VENOUS (DVT)  Result Date: 04/23/2020  Lower Venous DVTStudy Indications: Pain, Edema, and s/p left total knee replacement.  Limitations: Body habitus, poor ultrasound/tissue interface, bandages and restricted mobility, patient guarding secondary to pain. Comparison Study: No prior study Performing Technologist: Maudry Mayhew MHA, RDMS, RVT, RDCS  Examination Guidelines: A complete evaluation includes  B-mode imaging, spectral Doppler, color Doppler, and power Doppler as needed of all accessible portions of each vessel. Bilateral testing is considered an integral part of a complete examination. Limited examinations for reoccurring indications may be performed as noted. The reflux portion of the exam is performed with the patient in reverse Trendelenburg.  Right Technical Findings: Not visualized segments include CFV.  +---------+---------------+---------+-----------+----------+--------------+ LEFT     CompressibilityPhasicitySpontaneityPropertiesThrombus Aging +---------+---------------+---------+-----------+----------+--------------+ CFV      Full           Yes      Yes                                 +---------+---------------+---------+-----------+----------+--------------+ SFJ      Full                                                        +---------+---------------+---------+-----------+----------+--------------+ FV Prox  Full                                                        +---------+---------------+---------+-----------+----------+--------------+  FV Mid   Full                                                        +---------+---------------+---------+-----------+----------+--------------+ FV DistalFull                                                        +---------+---------------+---------+-----------+----------+--------------+ PFV      Full                                                        +---------+---------------+---------+-----------+----------+--------------+ POP      Full                                                        +---------+---------------+---------+-----------+----------+--------------+ PTV      Full                    Yes                                 +---------+---------------+---------+-----------+----------+--------------+ PERO     Full                    Yes                                  +---------+---------------+---------+-----------+----------+--------------+   Left Technical Findings: Unable to visualize popliteal vein in long axis due to technical limitations listed above.   Summary: LEFT: - There is no evidence of deep vein thrombosis in the lower extremity. However, portions of this examination were limited- see technologist comments above.  - No cystic structure found in the popliteal fossa.  *See table(s) above for measurements and observations. Electronically signed by Monica Martinez MD on 04/23/2020 at 3:00:04 PM.    Final     Discharge Medications:   Allergies as of 04/25/2020      Reactions   Contrast Media [iodinated Diagnostic Agents] Other (See Comments)   Unknown   Doxycycline Other (See Comments)   Unknown   Albuterol Rash   Pt states she had a rash from it once at [facility]   Albuterol Sulfate Rash   Amoxicillin Rash   Codeine Rash   Erythromycin Base Diarrhea, Rash      Medication List    STOP taking these medications   acetaminophen 500 MG tablet Commonly known as: TYLENOL     TAKE these medications   Adalimumab 40 MG/0.8ML Pnkt Inject 40 mg into the skin every 14 (fourteen) days.   ammonium lactate 12 % lotion Commonly known as: LAC-HYDRIN Apply 1 application topically daily as needed for dry skin.   azaTHIOprine 50 MG tablet Commonly known as: IMURAN Take  50 mg by mouth 2 (two) times daily.   buPROPion 150 MG 24 hr tablet Commonly known as: WELLBUTRIN XL Take 150 mg by mouth daily.   busPIRone 10 MG tablet Commonly known as: BUSPAR Take 10 mg by mouth 2 (two) times daily.   fluticasone 50 MCG/ACT nasal spray Commonly known as: FLONASE Place 2 sprays into both nostrils daily.   furosemide 40 MG tablet Commonly known as: LASIX Take 40 mg by mouth daily.   ipratropium 0.03 % nasal spray Commonly known as: ATROVENT Place 1 spray into the nose daily as needed (rhinitis).   levalbuterol 45 MCG/ACT inhaler Commonly known as: XOPENEX  HFA Inhale 2 puffs into the lungs every 4 (four) hours as needed for wheezing or shortness of breath.   levothyroxine 50 MCG tablet Commonly known as: SYNTHROID Take 50 mcg by mouth daily before breakfast.   methocarbamol 500 MG tablet Commonly known as: ROBAXIN Take 1 tablet (500 mg total) by mouth every 6 (six) hours as needed for muscle spasms.   metoprolol succinate 25 MG 24 hr tablet Commonly known as: TOPROL-XL Take 25 mg by mouth daily.   omeprazole 20 MG capsule Commonly known as: PRILOSEC Take 20 mg by mouth daily.   oxyCODONE-acetaminophen 5-325 MG tablet Commonly known as: PERCOCET/ROXICET Take 1-2 tablets by mouth every 4 (four) hours as needed for severe pain.   potassium chloride 10 MEQ tablet Commonly known as: KLOR-CON Take 10 mEq by mouth daily.   pravastatin 40 MG tablet Commonly known as: PRAVACHOL Take 40 mg by mouth daily.   prednisoLONE acetate 1 % ophthalmic suspension Commonly known as: PRED FORTE Place 1 drop into the right eye in the morning and at bedtime.   rivaroxaban 20 MG Tabs tablet Commonly known as: XARELTO Take 20 mg by mouth every evening.   Symbicort 160-4.5 MCG/ACT inhaler Generic drug: budesonide-formoterol Inhale 2 puffs into the lungs daily as needed (shortness of breath).            Durable Medical Equipment  (From admission, onward)         Start     Ordered   04/22/20 1505  For home use only DME 3 n 1  Once        04/22/20 1505   04/22/20 1505  For home use only DME Walker rolling  Once       Question Answer Comment  Walker: With 5 Inch Wheels   Patient needs a walker to treat with the following condition Weakness      04/22/20 1505          Diagnostic Studies: DG Knee 1-2 Views Left  Result Date: 04/21/2020 CLINICAL DATA:  Postop EXAM: LEFT KNEE - 1-2 VIEW COMPARISON:  03/09/2020 FINDINGS: Changes of left knee replacement. No hardware bony complicating feature. Soft tissue and joint space gas noted.  IMPRESSION: Left knee replacement.  No visible complicating feature. Electronically Signed   By: Rolm Baptise M.D.   On: 04/21/2020 16:37   VAS [place] LOWER EXTREMITY VENOUS (DVT)  Result Date: 04/23/2020  Lower Venous DVTStudy Indications: Pain, Edema, and s/p left total knee replacement.  Limitations: Body habitus, poor ultrasound/tissue interface, bandages and restricted mobility, patient guarding secondary to pain. Comparison Study: No prior study Performing Technologist: Maudry Mayhew MHA, RDMS, RVT, RDCS  Examination Guidelines: A complete evaluation includes B-mode imaging, spectral Doppler, color Doppler, and power Doppler as needed of all accessible portions of each vessel. Bilateral testing is considered an integral part of  a complete examination. Limited examinations for reoccurring indications may be performed as noted. The reflux portion of the exam is performed with the patient in reverse Trendelenburg.  Right Technical Findings: Not visualized segments include CFV.  +---------+---------------+---------+-----------+----------+--------------+ LEFT     CompressibilityPhasicitySpontaneityPropertiesThrombus Aging +---------+---------------+---------+-----------+----------+--------------+ CFV      Full           Yes      Yes                                 +---------+---------------+---------+-----------+----------+--------------+ SFJ      Full                                                        +---------+---------------+---------+-----------+----------+--------------+ FV Prox  Full                                                        +---------+---------------+---------+-----------+----------+--------------+ FV Mid   Full                                                        +---------+---------------+---------+-----------+----------+--------------+ FV DistalFull                                                         +---------+---------------+---------+-----------+----------+--------------+ PFV      Full                                                        +---------+---------------+---------+-----------+----------+--------------+ POP      Full                                                        +---------+---------------+---------+-----------+----------+--------------+ PTV      Full                    Yes                                 +---------+---------------+---------+-----------+----------+--------------+ PERO     Full                    Yes                                 +---------+---------------+---------+-----------+----------+--------------+   Left Technical Findings: Unable to visualize popliteal  vein in long axis due to technical limitations listed above.   Summary: LEFT: - There is no evidence of deep vein thrombosis in the lower extremity. However, portions of this examination were limited- see technologist comments above.  - No cystic structure found in the popliteal fossa.  *See table(s) above for measurements and observations. Electronically signed by Monica Martinez MD on 04/23/2020 at 3:00:04 PM.    Final     Patient benefited maximally from their hospital stay and there were no complications.     Disposition: Discharge disposition: 03-Skilled Nursing Facility      Discharge Instructions    Call MD / Call 911   Complete by: As directed    If you experience chest pain or shortness of breath, CALL 911 and be transported to the hospital emergency room.  If you develope a fever above 101 F, pus (white drainage) or increased drainage or redness at the wound, or calf pain, call your surgeon's office.   Constipation Prevention   Complete by: As directed    Drink plenty of fluids.  Prune juice may be helpful.  You may use a stool softener, such as Colace (over the counter) 100 mg twice a day.  Use MiraLax (over the counter) for constipation as needed.   Diet -  low sodium heart healthy   Complete by: As directed    Increase activity slowly as tolerated   Complete by: As directed       Contact information for follow-up providers    [name], MD Follow up in 1 week(s).   Specialty: Orthopedic Surgery Contact information: [place] [address] [phone]        Care, [facility] Follow up.   Specialty: [place] Why: home health services arranged, start of care 04/28/2020 Contact information: [place] [address] [phone]            Contact information for after-discharge care    Destination    [facility] .   Service: Skilled Nursing Contact information: [address] [place] [phone]                   Signed: Newt Minion 04/25/2020, 9:39 AM

## 2020-04-25 NOTE — Plan of Care (Signed)
Patient reports that she has not had a BM since PTA.  Pain medication administered PRN, discussed with patient the importance of staying in top of pain so that she will be able to move around more.  Problem: Education: Goal: Knowledge of General Education information will improve Description: Including pain rating scale, medication(s)/side effects and non-pharmacologic comfort measures Outcome: Progressing   Problem: Pain Managment: Goal: General experience of comfort will improve Outcome: Not Progressing   Problem: Elimination: Goal: Will not experience complications related to bowel motility Outcome: Not Progressing Goal: Will not experience complications related to urinary retention Outcome: Progressing

## 2020-04-25 NOTE — Progress Notes (Signed)
Patient discharged to [facility] via [place]. Patient's walker, BSC, and duffel bag labeled and placed in the front conference room on 5N for daughter to pick up. Patient's purse and home CPAP sent with her and PTAR.   Attempted to call report and was transferred to a voicemail. Left a message and await call back.  Massie Bougie, RN

## 2020-04-25 NOTE — Progress Notes (Signed)
Physical Therapy Treatment Patient Details Name: Jennifer Foley MRN: [id number] DOB: 1956/07/14 Today's Date: 04/25/2020    History of Present Illness Pt is a 64 y.o. F presents s/p elective L TKA 04/21/20.Significant PMH of atrial fibrillation, CHF, CKD, HLD    PT Comments    Pt continues to make slow, but steady progress. Pt able to transition to edge of bed with use of gait belt for progressing LLE off edge of bed. Requiring min assist for transfers, ambulating 40 feet with a walker at a min guard assist level, utilizing a step to pattern. L knee range of motion 15-72 degrees in seated position. Continues with L knee pain, weakness, decreased ROM, gait abnormalities and decreased endurance. Continue to recommend SNF for ongoing Physical Therapy.     Follow Up Recommendations  SNF     Equipment Recommendations  Rolling walker with 5" wheels;3in1 (PT)    Recommendations for Other Services       Precautions / Restrictions Precautions Precautions: Fall Restrictions Weight Bearing Restrictions: No LLE Weight Bearing: Weight bearing as tolerated    Mobility  Bed Mobility Overal bed mobility: Needs Assistance Bed Mobility: Supine to Sit     Supine to sit: Min guard     General bed mobility comments: Pt able to progress to edge of bed without physical assist, use of gait belt to progress LLE, significantly increased time/effort, step by step cues for sequencing.  Transfers Overall transfer level: Needs assistance Equipment used: Rolling walker (2 wheeled) Transfers: Sit to/from Stand Sit to Stand: Min assist         General transfer comment: MinA to power up to stand. Increased time/effort  Ambulation/Gait Ambulation/Gait assistance: Min guard Gait Distance (Feet): 40 Feet Assistive device: Rolling walker (2 wheeled) Gait Pattern/deviations: Step-to pattern;Decreased stride length;Decreased weight shift to left;Antalgic Gait velocity: decreased Gait velocity  interpretation: <1.31 ft/sec, indicative of household ambulator General Gait Details: Cues for upright posture, neutral left foot positioning. Pt requiring frequent standing rest breaks, increased pain towards end.   Stairs             Wheelchair Mobility    Modified Rankin (Stroke Patients Only)       Balance Overall balance assessment: Needs assistance Sitting-balance support: Feet unsupported Sitting balance-Leahy Scale: Good     Standing balance support: Bilateral upper extremity supported Standing balance-Leahy Scale: Poor Standing balance comment: reliant on external support                            Cognition Arousal/Alertness: Awake/alert Behavior During Therapy: WFL for tasks assessed/performed Overall Cognitive Status: Within Functional Limits for tasks assessed                                        Exercises Total Joint Exercises Ankle Circles/Pumps: Both;10 reps;Supine Quad Sets: Left;10 reps;Supine Goniometric ROM: 15-72    General Comments        Pertinent Vitals/Pain Pain Assessment: Faces Faces Pain Scale: Hurts whole lot Pain Location: L knee Pain Descriptors / Indicators: Grimacing;Operative site guarding;Aching;Sore;Moaning Pain Intervention(s): Monitored during session;Repositioned;Ice applied    Home Living                      Prior Function            PT Goals (current goals can now be found  in the care plan section) Acute Rehab PT Goals Patient Stated Goal: less pain PT Goal Formulation: With patient Time For Goal Achievement: 05/06/20 Potential to Achieve Goals: Good Progress towards PT goals: Progressing toward goals    Frequency    7X/week      PT Plan Current plan remains appropriate    Co-evaluation              AM-PAC PT "6 Clicks" Mobility   Outcome Measure  Help needed turning from your back to your side while in a flat bed without using bedrails?: None Help  needed moving from lying on your back to sitting on the side of a flat bed without using bedrails?: A Little Help needed moving to and from a bed to a chair (including a wheelchair)?: A Little Help needed standing up from a chair using your arms (e.g., wheelchair or bedside chair)?: A Little Help needed to walk in hospital room?: A Little Help needed climbing 3-5 steps with a railing? : Total 6 Click Score: 17    End of Session Equipment Utilized During Treatment: Gait belt Activity Tolerance: Patient limited by pain Patient left: with call bell/phone within reach;in chair;with chair alarm set Nurse Communication: Mobility status PT Visit Diagnosis: Other abnormalities of gait and mobility (R26.89);Difficulty in walking, not elsewhere classified (R26.2);Pain Pain - Right/Left: Left Pain - part of body: Knee     Time: 2993-7169 PT Time Calculation (min) (ACUTE ONLY): 26 min  Charges:  $Gait Training: 23-37 mins                       Wyona Almas, PT, DPT Acute Rehabilitation Services Pager [phone] Office [place] 04/25/2020, 11:30 AM

## 2020-04-25 NOTE — TOC Transition Note (Addendum)
Transition of Care [facility]) - CM/SW Discharge Note   Patient Details  Name: Jennifer Foley MRN: [id number] Date of Birth: 02-15-1956  Transition of Care [facility] CM/SW Contact:  Jacquelynn Cree Phone Number: 04/25/2020, 3:49 PM   Clinical Narrative:     Patient will DC to: [facility] notified: [facility] by: Corey Harold  RN, patient, patient's family, and facility notified of DC. Discharge Summary and FL2 sent to facility. RN to call report prior to discharge [phone] room 102). DC packet on chart. Ambulance transport requested for patient.   CSW will sign off for now as social work intervention is no longer needed. Please consult [place] again if new needs arise.   Final next level of care: Home w Home Health Services Barriers to Discharge: No SNF bed, Insurance Authorization   Patient Goals and CMS Choice     Choice offered to / list presented to : Patient  Discharge Placement                       Discharge Plan and Services   Discharge Planning Services: CM Consult            DME Arranged: 3-N-1, Walker rolling DME Agency: [employer] Date DME Agency Contacted: 04/22/20 Time DME Agency Contacted: [id number] Representative spoke with at DME Agency: [place]: PT [place]: [place] Date [place]: 04/22/20 Time [place]: [id number] Representative spoke with at [place]: [place] Determinants of Health ([place]) Interventions     Readmission Risk Interventions No flowsheet data found.

## 2020-04-27 ENCOUNTER — Telehealth: Payer: Self-pay

## 2020-04-27 NOTE — Telephone Encounter (Signed)
I called discussed.  

## 2020-04-27 NOTE — Telephone Encounter (Signed)
Jennifer Foley, occupational therapist with [facility] would like clarification on knee immobilizer and PT notes.  Patient had left TKA surgery on 04/21/2020.  CB# [phone].  Please advise.  Thank you.

## 2020-04-27 NOTE — Progress Notes (Signed)
Daughter picked up equipment including BSC, walker, and duffle bag.

## 2020-04-27 NOTE — Telephone Encounter (Signed)
Please advise 

## 2020-05-05 ENCOUNTER — Ambulatory Visit: Payer: Self-pay

## 2020-05-05 ENCOUNTER — Ambulatory Visit (INDEPENDENT_AMBULATORY_CARE_PROVIDER_SITE_OTHER): Payer: Medicare Other | Admitting: Orthopaedic Surgery

## 2020-05-05 ENCOUNTER — Encounter: Payer: Self-pay | Admitting: Orthopaedic Surgery

## 2020-05-05 VITALS — BP 122/79 | HR 80

## 2020-05-05 DIAGNOSIS — M1712 Unilateral primary osteoarthritis, left knee: Secondary | ICD-10-CM

## 2020-05-05 DIAGNOSIS — G8929 Other chronic pain: Secondary | ICD-10-CM

## 2020-05-05 DIAGNOSIS — M25562 Pain in left knee: Secondary | ICD-10-CM

## 2020-05-05 NOTE — Progress Notes (Signed)
Post-Op Visit Note   Patient: Jennifer Foley           Date of Birth: [birth year]           MRN: [id number] Visit Date: 05/05/2020 PCP: Bartholome Bill, MD   Assessment & Plan: Post left total knee arthroplasty.  Staples harvested Steri-Strips applied.  She is staying in a SNF.  Making progress with walking and did about 100 feet she estimates.  Recheck 4 weeks.  She will need home health physical therapy when she gets home and is discharged from the skilled facility.  Chief Complaint:  Chief Complaint  Patient presents with   Left Knee - Pain   Visit Diagnoses:  1. Chronic pain of left knee   2. Unilateral primary osteoarthritis, left knee     Plan: ROV 4 wks.   Follow-Up Instructions: No follow-ups on file.   Orders:  Orders Placed This Encounter  Procedures   XR Knee 1-2 Views Left   No orders of the defined types were placed in this encounter.   Imaging: No results found.  PMFS History: Patient Active Problem List   Diagnosis Date Noted   Total knee replacement status 04/22/2020   Arthritis of left knee 04/21/2020   Unilateral primary osteoarthritis, left knee 03/24/2020   Noncompliance with CPAP treatment 09/24/2018   A-fib ([place]) 08/31/2018   CHF (congestive heart failure) ([place]) 08/31/2018   Acute pyelonephritis 08/31/2018   Sepsis secondary to UTI ([place]) 08/31/2018   Leucocytosis 08/31/2018   Hypothyroidism 08/31/2018   CKD (chronic kidney disease) stage 3, GFR 30-59 ml/min 08/31/2018   HTN (hypertension) 08/31/2018   HLD (hyperlipidemia) 08/31/2018   Anxiety and depression 08/31/2018   Obesity 08/31/2018   Sepsis ([place]) 08/31/2018   Back pain 08/31/2018   Obstructive sleep apnea treated with continuous positive airway pressure (CPAP) 08/13/2018   Past Medical History:  Diagnosis Date   Anemia    Arthritis    per patient, in left and right knee and both hands   Atrial fibrillation (HCC)    Bilateral breast cysts     CHF (congestive heart failure) ([place])    Chronic kidney disease    stage 3   Depression with anxiety    DM (diabetes mellitus) (HCC)    Esophageal reflux    History of hiatal hernia    HTN (hypertension)    Hyperlipemia    Hyperthyroidism    Hypothyroidism    INR (international normal ratio) abnormal    MRSA (methicillin resistant staph aureus) culture positive    Panuveitis    Pneumonia    PTSD (post-traumatic stress disorder)    Sleep apnea    per patient has CPAP, wears it at night sometimes    Family History  Problem Relation Age of Onset   Alcoholism Father    Arthritis Father    Hypertension Father        sister, mother   Diabetes Mellitus I Mother        sister   Renal Disease Mother    Stroke Mother    Epilepsy Brother     Past Surgical History:  Procedure Laterality Date   ABDOMINAL HYSTERECTOMY     BREAST BIOPSY     CATARACT EXTRACTION  2018   CESAREAN SECTION     DENTAL SURGERY     EYE SURGERY     FOOT SURGERY     HERNIA REPAIR     KNEE SURGERY     TONSILLECTOMY AND ADENOIDECTOMY  TOTAL KNEE ARTHROPLASTY Left 04/21/2020   Procedure: LEFT TOTAL KNEE ARTHROPLASTY;  Surgeon: [name], MD;  Location: [facility];  Service: Orthopedics;  Laterality: Left;   Social History   Occupational History   Not on file  Tobacco Use   Smoking status: Former Smoker    Packs/day: 0.25    Years: 34.00    Pack years: 8.50    Types: Cigarettes    Quit date: 12/15/2003    Years since quitting: 16.4   Smokeless tobacco: Current User    Types: Snuff   Tobacco comment: 04/15/2020: per patient 1-2 times a day, trying to stop taking it  Vaping Use   Vaping Use: Never used  Substance and Sexual Activity   Alcohol use: Not Currently   Drug use: Not on file   Sexual activity: Not on file

## 2020-05-19 ENCOUNTER — Telehealth: Payer: Self-pay | Admitting: Orthopaedic Surgery

## 2020-05-19 NOTE — Telephone Encounter (Signed)
Mickel Baas from [facility] called. She is the PT working with the patient. She would like orders for 2x 1wk, 3x 2wk, 3x 2wk, 2x 2 wk and 1x 2 wk. Her call back number is [phone]

## 2020-05-19 NOTE — Telephone Encounter (Signed)
I left voicemail giving verbal orders.

## 2020-06-02 ENCOUNTER — Encounter: Payer: Self-pay | Admitting: Orthopaedic Surgery

## 2020-06-02 ENCOUNTER — Ambulatory Visit (INDEPENDENT_AMBULATORY_CARE_PROVIDER_SITE_OTHER): Payer: Medicare Other | Admitting: Orthopaedic Surgery

## 2020-06-02 DIAGNOSIS — Z96652 Presence of left artificial knee joint: Secondary | ICD-10-CM

## 2020-06-02 MED ORDER — HYDROCODONE-ACETAMINOPHEN 5-325 MG PO TABS: 1.0000 | ORAL_TABLET | Freq: Four times a day (QID) | ORAL | 0 refills | Status: DC | PRN

## 2020-06-02 NOTE — Progress Notes (Signed)
Post-Op Visit Note   Patient: Jennifer Foley           Date of Birth: [birth year]           MRN: [id number] Visit Date: 06/02/2020 PCP: Bartholome Bill, MD   Assessment & Plan: Patient presents post total knee arthroplasty left.  She has swelling needs new TEDS since she has had some runs in them.  She is now doing home therapy for the last month lacks 5 degrees reaching full extension is to stretch out after several minutes passively.  She is only flexing to 50 degrees and after 6 weeks of home therapy she not making progress on flexion I discussed with her that she does not do better with flexion she will require manipulation.  We will switch her from the Percocet to [place].  Set up outpatient therapy see if they can make some progress.  We spent 20 minutes going over knee flexion exercises.  She has a hard time staying with the exercise quits after 2030 seconds with her ankles crossed.  I plan to check her in 3 weeks if she is not making progress then manipulation under anesthesia will be scheduled.  Chief Complaint:  Chief Complaint  Patient presents with  . Left Knee - Follow-up    04/21/2020 Left TKA   Visit Diagnoses:  1. S/P total knee arthroplasty, left     Plan: ROV 3 wks. If not better motion will need manipulation under anesthesia  Follow-Up Instructions: Return in about 3 weeks (around 06/23/2020).   Orders:  No orders of the defined types were placed in this encounter.  No orders of the defined types were placed in this encounter.   Imaging: No results found.  PMFS History: Patient Active Problem List   Diagnosis Date Noted  . S/P total knee arthroplasty, left 06/02/2020  . Noncompliance with CPAP treatment 09/24/2018  . A-fib ([place]) 08/31/2018  . CHF (congestive heart failure) ([place]) 08/31/2018  . Acute pyelonephritis 08/31/2018  . Sepsis secondary to UTI ([place]) 08/31/2018  . Leucocytosis 08/31/2018  . Hypothyroidism 08/31/2018  . CKD (chronic kidney  disease) stage 3, GFR 30-59 ml/min 08/31/2018  . HTN (hypertension) 08/31/2018  . HLD (hyperlipidemia) 08/31/2018  . Anxiety and depression 08/31/2018  . Obesity 08/31/2018  . Sepsis ([place]) 08/31/2018  . Back pain 08/31/2018  . Obstructive sleep apnea treated with continuous positive airway pressure (CPAP) 08/13/2018   Past Medical History:  Diagnosis Date  . Anemia   . Arthritis    per patient, in left and right knee and both hands  . Atrial fibrillation ([place])   . Bilateral breast cysts   . CHF (congestive heart failure) ([place])   . Chronic kidney disease    stage 3  . Depression with anxiety   . DM (diabetes mellitus) ([place])   . Esophageal reflux   . History of hiatal hernia   . HTN (hypertension)   . Hyperlipemia   . Hyperthyroidism   . Hypothyroidism   . INR (international normal ratio) abnormal   . MRSA (methicillin resistant staph aureus) culture positive   . Panuveitis   . Pneumonia   . PTSD (post-traumatic stress disorder)   . Sleep apnea    per patient has CPAP, wears it at night sometimes    Family History  Problem Relation Age of Onset  . Alcoholism Father   . Arthritis Father   . Hypertension Father        sister, mother  . Diabetes  Mellitus I Mother        sister  . Renal Disease Mother   . Stroke Mother   . Epilepsy Brother     Past Surgical History:  Procedure Laterality Date  . ABDOMINAL HYSTERECTOMY    . BREAST BIOPSY    . CATARACT EXTRACTION  2018  . CESAREAN SECTION    . DENTAL SURGERY    . EYE SURGERY    . FOOT SURGERY    . HERNIA REPAIR    . KNEE SURGERY    . TONSILLECTOMY AND ADENOIDECTOMY    . TOTAL KNEE ARTHROPLASTY Left 04/21/2020   Procedure: LEFT TOTAL KNEE ARTHROPLASTY;  Surgeon: [name], MD;  Location: [facility];  Service: Orthopedics;  Laterality: Left;   Social History   Occupational History  . Not on file  Tobacco Use  . Smoking status: Former Smoker    Packs/day: 0.25    Years: 34.00    Pack years: 8.50    Types:  Cigarettes    Quit date: 12/15/2003    Years since quitting: 16.4  . Smokeless tobacco: Current User    Types: Snuff  . Tobacco comment: 04/15/2020: per patient 1-2 times a day, trying to stop taking it  Vaping Use  . Vaping Use: Never used  Substance and Sexual Activity  . Alcohol use: Not Currently  . Drug use: Not on file  . Sexual activity: Not on file

## 2020-06-10 ENCOUNTER — Other Ambulatory Visit: Payer: Self-pay

## 2020-06-10 ENCOUNTER — Encounter: Payer: Self-pay | Admitting: Physical Therapy

## 2020-06-10 ENCOUNTER — Ambulatory Visit (INDEPENDENT_AMBULATORY_CARE_PROVIDER_SITE_OTHER): Payer: Medicare Other | Admitting: Physical Therapy

## 2020-06-10 DIAGNOSIS — R262 Difficulty in walking, not elsewhere classified: Secondary | ICD-10-CM | POA: Diagnosis not present

## 2020-06-10 DIAGNOSIS — M25662 Stiffness of left knee, not elsewhere classified: Secondary | ICD-10-CM | POA: Diagnosis not present

## 2020-06-10 DIAGNOSIS — M25562 Pain in left knee: Secondary | ICD-10-CM

## 2020-06-10 DIAGNOSIS — R600 Localized edema: Secondary | ICD-10-CM | POA: Diagnosis not present

## 2020-06-10 NOTE — Therapy (Signed)
[place], [place], [address] Phone: [phone]   Fax:  [phone]  Physical Therapy Evaluation  Patient Details  Name: Jennifer Foley MRN: [id number] Date of Birth: 02/19/1956 Referring Provider (PT): Rodell Perna, MD   Encounter Date: 06/10/2020   PT End of Session - 06/10/20 1731    Visit Number 1    Number of Visits 16    Date for PT Re-Evaluation 08/05/20    Progress Note Due on Visit 10    PT Start Time 1435    PT Stop Time 1515    PT Time Calculation (min) 40 min    Activity Tolerance Patient tolerated treatment well    Behavior During Therapy [facility] for tasks assessed/performed           Past Medical History:  Diagnosis Date  . Anemia   . Arthritis    per patient, in left and right knee and both hands  . Atrial fibrillation ([place])   . Bilateral breast cysts   . CHF (congestive heart failure) ([place])   . Chronic kidney disease    stage 3  . Depression with anxiety   . DM (diabetes mellitus) ([place])   . Esophageal reflux   . History of hiatal hernia   . HTN (hypertension)   . Hyperlipemia   . Hyperthyroidism   . Hypothyroidism   . INR (international normal ratio) abnormal   . MRSA (methicillin resistant staph aureus) culture positive   . Panuveitis   . Pneumonia   . PTSD (post-traumatic stress disorder)   . Sleep apnea    per patient has CPAP, wears it at night sometimes    Past Surgical History:  Procedure Laterality Date  . ABDOMINAL HYSTERECTOMY    . BREAST BIOPSY    . CATARACT EXTRACTION  2018  . CESAREAN SECTION    . DENTAL SURGERY    . EYE SURGERY    . FOOT SURGERY    . HERNIA REPAIR    . KNEE SURGERY    . TONSILLECTOMY AND ADENOIDECTOMY    . TOTAL KNEE ARTHROPLASTY Left 04/21/2020   Procedure: LEFT TOTAL KNEE ARTHROPLASTY;  Surgeon: [name], MD;  Location: [facility];  Service: Orthopedics;  Laterality: Left;    There were no vitals filed for this visit.    Subjective Assessment  - 06/10/20 1444    Subjective Pt arriving to therapy s/p L TKA on 04/21/2020. Pt amb with rolling walker. Pt with L ted hose on. Pt reproting pain of 6/10 pain in L knee. Pt reporting difficulty with ADL's and sleeping.    Limitations Walking;Standing;House hold activities    Diagnostic tests X-ray    Patient Stated Goals walk without a cane    Currently in Pain? Yes    Pain Score 6     Pain Location Knee    Pain Descriptors / Indicators Aching    Pain Type Surgical pain    Pain Onset More than a month ago    Pain Frequency Intermittent    Aggravating Factors  bending, walking, standing    Pain Relieving Factors ice, pain meds    Effect of Pain on Daily Activities difficulty sleeping, difficulty walking and doing ADL's.              [facility] PT Assessment - 06/10/20 0001      Assessment   Medical Diagnosis L TKA    Referring Provider (PT) Rodell Perna, MD    Onset Date/Surgical Date 04/21/20  Hand Dominance Right    Prior Therapy yes, reahab and HHPT      Precautions   Precautions None      Restrictions   Weight Bearing Restrictions No      Balance Screen   Has the patient fallen in the past 6 months No    Is the patient reluctant to leave their home because of a fear of falling?  No      Home [employer] residence      Prior Function   Level of Independence Independent      Cognition   Overall Cognitive Status Within Functional Limits for tasks assessed      Observation/Other Assessments-Edema    Edema Circumferential      Circumferential Edema   Circumferential - Right 39 centimeters knee    Circumferential - Left  44 centimeters knee      Posture/Postural Control   Posture/Postural Control Postural limitations    Postural Limitations Rounded Shoulders;Forward head;Increased lumbar lordosis      ROM / Strength   AROM / PROM / Strength AROM;PROM;Strength      AROM   Overall AROM  Deficits    AROM Assessment Site Knee     Right/Left Knee Right;Left    Right Knee Extension 0    Right Knee Flexion 125    Left Knee Extension 10    Left Knee Flexion 50      PROM   Overall PROM  Deficits    PROM Assessment Site Knee    Right/Left Knee Right;Left    Left Knee Extension 5    Left Knee Flexion 55      Strength   Strength Assessment Site Knee    Right/Left Knee Right;Left    Right Knee Flexion 4+/5    Right Knee Extension 4+/5    Left Knee Flexion 2+/5    Left Knee Extension 2+/5      Palpation   Patella mobility limited by edema and pain    Palpation comment TTP, medial and lateral joint line      Transfers   Five time sit to stand comments  25.3 seconds using UE support      Ambulation/Gait   Assistive device Rolling walker    Gait Pattern Within Functional Limits;Step-through pattern;Decreased step length - right;Decreased step length - left;Decreased stance time - right;Decreased stance time - left;Decreased stride length;Decreased dorsiflexion - left;Decreased weight shift to left;Left flexed knee in stance;Antalgic                      Objective measurements completed on examination: See above findings.       [facility] Adult PT Treatment/Exercise - 06/10/20 0001      Modalities   Modalities Vasopneumatic      Vasopneumatic   Number Minutes Vasopneumatic  8 minutes    Vasopnuematic Location  Knee    Vasopneumatic Pressure Low    Vasopneumatic Temperature  34                  PT Education - 06/10/20 1730    Education Details PT POC, HEP    Person(s) Educated Patient    Methods Explanation;Demonstration    Comprehension Verbalized understanding;Returned demonstration               PT Long Term Goals - 06/10/20 1745      PT LONG TERM GOAL #1   Title Patient will be independent in her  HEP and progression.    Baseline inital program issued on 06/10/2020    Time 8    Period Weeks    Status New    Target Date 08/05/20      PT LONG TERM GOAL #2   Title  Patient able to complete 5 time sit to stand </= 18 seconds to improve balance and functional mobility.    Baseline 25.3 seconds using UE support    Time 8    Period Weeks    Status New    Target Date 08/05/20      PT LONG TERM GOAL #3   Title Patient will be able to amb with straigth cane for >/= 500 feet on community level surfaces with step through gait pattern.    Baseline currently using RW walking on househod distances    Time 8    Period Weeks    Status New      PT LONG TERM GOAL #4   Title Pt will improve her L knee flexion to >/= 90 degrees go improve funtional mobility and gait.    Baseline 50 degrees actively    Time 8    Period Weeks                  Plan - 06/10/20 1732    Clinical Impression Statement Pt presenting s/p L TKA on 04/21/2020. Pt reporting history of 2 week sub acute rehab stay followed by HHPT. Pt arriving today with L knee  active ROM arc of 10-50 degrees. PROM: 5-55 degrees. Pt reporting increased pain with all flexion based movements. Pt unable to perform a SLR.  Pt currently amb with a rolling walker.  Pt was instructed in updated HEP for ROM and strenghteing. Manipulation procedure was discussed since pt reported Dr. Lorin Mercy had already mentioned it at pt's last visit. Skilled PT needed to progress pt toward more functional mobility and gait.    Personal Factors and Comorbidities Comorbidity 3+    Comorbidities L TKA 04/21/2020, a-fib, arthritis, CHF, CKD, DM, depression, HTN, sleep apnea, PTSD, hyperthyroidism    Examination-Activity Limitations Squat;Sit;Sleep;Stairs;Stand;Lift;Transfers    Examination-Participation Restrictions Other;Church;Shop;Community Activity;Driving    Stability/Clinical Decision Making Stable/Uncomplicated    Clinical Decision Making Low    Rehab Potential Fair    PT Frequency 2x / week    PT Duration 8 weeks    PT Treatment/Interventions Cryotherapy;Electrical Stimulation;ADLs/Self Care Home Management;Ultrasound;Gait  training;Stair training;Functional mobility training;Therapeutic activities;Therapeutic exercise;Moist Heat;Balance training;Neuromuscular re-education;Patient/family education;Manual techniques;Scar mobilization;Dry needling;Taping;Vasopneumatic Device;Passive range of motion    PT Next Visit Plan Nustep, PROM L knee, strengthening, VMO strengtheing using E-stim vs blood flow restriction    PT Home Exercise Plan [email or web address]    Consulted and Agree with Plan of Care Patient           Patient will benefit from skilled therapeutic intervention in order to improve the following deficits and impairments:  Pain, Postural dysfunction, Decreased activity tolerance, Decreased strength, Impaired flexibility, Obesity, Decreased range of motion, Increased edema, Difficulty walking, Decreased balance  Visit Diagnosis: Acute pain of left knee  Stiffness of left knee, not elsewhere classified  Difficulty in walking, not elsewhere classified  Localized edema     Problem List Patient Active Problem List   Diagnosis Date Noted  . S/P total knee arthroplasty, left 06/02/2020  . Noncompliance with CPAP treatment 09/24/2018  . A-fib ([place]) 08/31/2018  . CHF (congestive heart failure) ([place]) 08/31/2018  . Acute pyelonephritis 08/31/2018  . Sepsis secondary to UTI ([place])  08/31/2018  . Leucocytosis 08/31/2018  . Hypothyroidism 08/31/2018  . CKD (chronic kidney disease) stage 3, GFR 30-59 ml/min 08/31/2018  . HTN (hypertension) 08/31/2018  . HLD (hyperlipidemia) 08/31/2018  . Anxiety and depression 08/31/2018  . Obesity 08/31/2018  . Sepsis ([place]) 08/31/2018  . Back pain 08/31/2018  . Obstructive sleep apnea treated with continuous positive airway pressure (CPAP) 08/13/2018    Oretha Caprice PT, MPT 06/10/2020, 5:55 PM  [facility] [address] [place], [place], [address] Phone: [phone]   Fax:  [phone]  Name: Phynix Horton MRN: [id number] Date of  Birth: 1956/07/13

## 2020-06-10 NOTE — Patient Instructions (Signed)
Access Code: [email or web address] URL: [email or web address] Date: 06/10/2020 Prepared by: Kearney Hard  Exercises Supine Knee Extension Strengthening - 2-3 x daily - 7 x weekly - 3 sets - 10 reps - 5 seconds hold Seated Long Arc Quad - 2-3 x daily - 7 x weekly - 3 sets - 10 reps - 3 seconds hold Supine Active Straight Leg Raise - 2-3 x daily - 7 x weekly - 10 reps Supine Heel Slide with Strap - 2-3 x daily - 7 x weekly - 10 reps - 5 seconds hold Sit to Stand with Counter Support - 2-3 x daily - 7 x weekly - 2 sets - 10 reps

## 2020-06-14 ENCOUNTER — Ambulatory Visit (INDEPENDENT_AMBULATORY_CARE_PROVIDER_SITE_OTHER): Payer: Medicare Other | Admitting: Physical Therapy

## 2020-06-14 ENCOUNTER — Other Ambulatory Visit: Payer: Self-pay

## 2020-06-14 ENCOUNTER — Encounter: Payer: Self-pay | Admitting: Physical Therapy

## 2020-06-14 DIAGNOSIS — M25662 Stiffness of left knee, not elsewhere classified: Secondary | ICD-10-CM | POA: Diagnosis not present

## 2020-06-14 DIAGNOSIS — R600 Localized edema: Secondary | ICD-10-CM | POA: Diagnosis not present

## 2020-06-14 DIAGNOSIS — M25562 Pain in left knee: Secondary | ICD-10-CM | POA: Diagnosis not present

## 2020-06-14 DIAGNOSIS — R262 Difficulty in walking, not elsewhere classified: Secondary | ICD-10-CM

## 2020-06-14 NOTE — Therapy (Signed)
[place], [place], [address] Phone: [phone]   Fax:  [phone]  Physical Therapy Treatment  Patient Details  Name: Jennifer Foley MRN: [id number] Date of Birth: 10/06/1956 Referring Provider (PT): Rodell Perna, MD   Encounter Date: 06/14/2020   PT End of Session - 06/14/20 1450    Visit Number 2    Number of Visits 16    Date for PT Re-Evaluation 08/05/20    Progress Note Due on Visit 10    PT Start Time 1450    PT Stop Time 1600    PT Time Calculation (min) 70 min    Activity Tolerance Patient tolerated treatment well    Behavior During Therapy [facility] for tasks assessed/performed           Past Medical History:  Diagnosis Date  . Anemia   . Arthritis    per patient, in left and right knee and both hands  . Atrial fibrillation ([place])   . Bilateral breast cysts   . CHF (congestive heart failure) ([place])   . Chronic kidney disease    stage 3  . Depression with anxiety   . DM (diabetes mellitus) ([place])   . Esophageal reflux   . History of hiatal hernia   . HTN (hypertension)   . Hyperlipemia   . Hyperthyroidism   . Hypothyroidism   . INR (international normal ratio) abnormal   . MRSA (methicillin resistant staph aureus) culture positive   . Panuveitis   . Pneumonia   . PTSD (post-traumatic stress disorder)   . Sleep apnea    per patient has CPAP, wears it at night sometimes    Past Surgical History:  Procedure Laterality Date  . ABDOMINAL HYSTERECTOMY    . BREAST BIOPSY    . CATARACT EXTRACTION  2018  . CESAREAN SECTION    . DENTAL SURGERY    . EYE SURGERY    . FOOT SURGERY    . HERNIA REPAIR    . KNEE SURGERY    . TONSILLECTOMY AND ADENOIDECTOMY    . TOTAL KNEE ARTHROPLASTY Left 04/21/2020   Procedure: LEFT TOTAL KNEE ARTHROPLASTY;  Surgeon: [name], MD;  Location: [facility];  Service: Orthopedics;  Laterality: Left;    There were no vitals filed for this visit.   Subjective Assessment -  06/14/20 1450    Subjective She did the exercises that initial PT gave her.    Limitations Walking;Standing;House hold activities    Diagnostic tests X-ray    Patient Stated Goals walk without a cane    Currently in Pain? Yes    Pain Score 7    In last week, worst 9/10 and best 0/10   Pain Location Knee    Pain Orientation Left    Pain Descriptors / Indicators Sharp;Throbbing    Pain Type Chronic pain    Pain Onset More than a month ago    Pain Frequency Intermittent    Aggravating Factors  walking or standing over 10-15 minutes    Pain Relieving Factors medication, ice    Effect of Pain on Daily Activities limts standing & walking                             [facility] Adult PT Treatment/Exercise - 06/14/20 1454      Transfers   Transfers Sit to Stand;Stand to Sit    Sit to Stand 5: Supervision;With upper extremity assist  Sit to Stand Details Visual cues/gestures for sequencing;Verbal cues for technique    Five time sit to stand comments  --    Stand to Sit 5: Supervision;With upper extremity assist    Stand to Sit Details (indicate cue type and reason) Visual cues/gestures for sequencing;Verbal cues for technique      Ambulation/Gait   Ambulation/Gait Yes    Ambulation/Gait Assistance 5: Supervision    Ambulation/Gait Assistance Details verbal cues on left knee flexion in swing    Ambulation Distance (Feet) 100 Feet    Assistive device Rolling walker    Gait Pattern Within Functional Limits;Step-through pattern;Decreased step length - right;Decreased step length - left;Decreased stance time - right;Decreased stance time - left;Decreased stride length;Decreased dorsiflexion - left;Decreased weight shift to left;Left flexed knee in stance;Antalgic    Pre-Gait Activities stepping over towel roll to facilitate left knee flexion in swing.       Posture/Postural Control   Posture/Postural Control --    Postural Limitations --      Self-Care   Self-Care Heat/Ice  Application    Heat/Ice Application ice massage to left knee. Pt return demo understanding for 5 minutes.       Knee/Hip Exercises: Stretches   Sports administrator Left;3 reps;30 seconds    Quad Stretch Limitations assisted with sheet tied around ankle.  PT added to HEP      Knee/Hip Exercises: Aerobic   Recumbent Bike seat 5 rocking back & forth for 5 minutes with maximal knee flexion.  PT manual assisting LLE      Knee/Hip Exercises: Seated   Long Arc Quad AROM;Strengthening;Left;1 set;10 reps    Heel Slides PROM;Left;1 set;10 reps    Heel Slides Limitations weight shift to right, LLE heel slide, then sit up straight with weight on BLEs      Knee/Hip Exercises: Supine   Short Arc Quad Sets AROM;Strengthening;Left;1 set;15 reps    Heel Slides AROM;Left;2 sets;10 reps    Heel Slides Limitations 1st set sliding on mat table;  2nd set LLE on 55cm ball and rolling for maximal knee flexion.     Straight Leg Raises AROM;Strengthening;Left;1 set;15 reps    Straight Leg Raises Limitations verbal cues on technique      Modalities   Modalities Vasopneumatic      Vasopneumatic   Number Minutes Vasopneumatic  10 minutes    Vasopnuematic Location  Knee    Vasopneumatic Pressure Medium    Vasopneumatic Temperature  34                       PT Long Term Goals - 06/10/20 1745      PT LONG TERM GOAL #1   Title Patient will be independent in her HEP and progression.    Baseline inital program issued on 06/10/2020    Time 8    Period Weeks    Status New    Target Date 08/05/20      PT LONG TERM GOAL #2   Title Patient able to complete 5 time sit to stand </= 18 seconds to improve balance and functional mobility.    Baseline 25.3 seconds using UE support    Time 8    Period Weeks    Status New    Target Date 08/05/20      PT LONG TERM GOAL #3   Title Patient will be able to amb with straigth cane for >/= 500 feet on community level surfaces with step through gait pattern.  Baseline currently using RW walking on househod distances    Time 8    Period Weeks    Status New      PT LONG TERM GOAL #4   Title Pt will improve her L knee flexion to >/= 90 degrees go improve funtional mobility and gait.    Baseline 50 degrees actively    Time 8    Period Weeks                 Plan - 06/14/20 1531    Clinical Impression Statement PT session focused on improving understanding of HEP & added 2 more exercises.  Patient appears to limit stretches with inital tension noted.    Personal Factors and Comorbidities Comorbidity 3+    Comorbidities L TKA 04/21/2020, a-fib, arthritis, CHF, CKD, DM, depression, HTN, sleep apnea, PTSD, hyperthyroidism    Examination-Activity Limitations Squat;Sit;Sleep;Stairs;Stand;Lift;Transfers    Examination-Participation Restrictions Other;Church;Shop;Community Activity;Driving    Stability/Clinical Decision Making Stable/Uncomplicated    Rehab Potential Fair    PT Frequency 2x / week    PT Duration 8 weeks    PT Treatment/Interventions Cryotherapy;Electrical Stimulation;ADLs/Self Care Home Management;Ultrasound;Gait training;Stair training;Functional mobility training;Therapeutic activities;Therapeutic exercise;Moist Heat;Balance training;Neuromuscular re-education;Patient/family education;Manual techniques;Scar mobilization;Dry needling;Taping;Vasopneumatic Device;Passive range of motion    PT Next Visit Plan recumbent bike, PROM L knee, strengthening, VMO strengtheing using E-stim vs blood flow restriction    PT Home Exercise Plan [email or web address]    Consulted and Agree with Plan of Care Patient           Patient will benefit from skilled therapeutic intervention in order to improve the following deficits and impairments:  Pain, Postural dysfunction, Decreased activity tolerance, Decreased strength, Impaired flexibility, Obesity, Decreased range of motion, Increased edema, Difficulty walking, Decreased balance  Visit Diagnosis: Acute  pain of left knee  Stiffness of left knee, not elsewhere classified  Difficulty in walking, not elsewhere classified  Localized edema     Problem List Patient Active Problem List   Diagnosis Date Noted  . S/P total knee arthroplasty, left 06/02/2020  . Noncompliance with CPAP treatment 09/24/2018  . A-fib ([place]) 08/31/2018  . CHF (congestive heart failure) ([place]) 08/31/2018  . Acute pyelonephritis 08/31/2018  . Sepsis secondary to UTI ([place]) 08/31/2018  . Leucocytosis 08/31/2018  . Hypothyroidism 08/31/2018  . CKD (chronic kidney disease) stage 3, GFR 30-59 ml/min 08/31/2018  . HTN (hypertension) 08/31/2018  . HLD (hyperlipidemia) 08/31/2018  . Anxiety and depression 08/31/2018  . Obesity 08/31/2018  . Sepsis ([place]) 08/31/2018  . Back pain 08/31/2018  . Obstructive sleep apnea treated with continuous positive airway pressure (CPAP) 08/13/2018    Jamey Reas PT, DPT 06/14/2020, 4:20 PM  [facility] [address] [place], [place], [address] Phone: [phone]   Fax:  [phone]  Name: Jennifer Foley MRN: [id number] Date of Birth: 15-May-1956

## 2020-06-14 NOTE — Patient Instructions (Signed)
Access Code: [email or web address] URL: [email or web address] Date: 06/14/2020 Prepared by: Jamey Reas  Exercises Seated Long Arc Quad - 2-3 x daily - 7 x weekly - 3 sets - 10 reps - 3 seconds hold Sit to Stand with Counter Support - 2-3 x daily - 7 x weekly - 2 sets - 10 reps Supine Knee Extension Strengthening - 2-3 x daily - 7 x weekly - 3 sets - 10 reps - 5 seconds hold Supine Active Straight Leg Raise - 2-3 x daily - 7 x weekly - 10 reps Supine Heel Slide with Strap - 2-3 x daily - 7 x weekly - 10 reps - 5 seconds hold Supine Quadriceps Stretch with Strap on Table - 2-3 x daily - 7 x weekly - 1 sets - 3 reps - 30 seconds hold Seated Knee Flexion - 2-3 x daily - 7 x weekly - 1 sets - 10 reps - 15 seconds hold

## 2020-06-16 ENCOUNTER — Encounter: Payer: Self-pay | Admitting: Rehabilitative and Restorative Service Providers"

## 2020-06-16 ENCOUNTER — Ambulatory Visit (INDEPENDENT_AMBULATORY_CARE_PROVIDER_SITE_OTHER): Payer: Medicare Other | Admitting: Rehabilitative and Restorative Service Providers"

## 2020-06-16 ENCOUNTER — Other Ambulatory Visit: Payer: Self-pay

## 2020-06-16 DIAGNOSIS — R600 Localized edema: Secondary | ICD-10-CM

## 2020-06-16 DIAGNOSIS — R262 Difficulty in walking, not elsewhere classified: Secondary | ICD-10-CM | POA: Diagnosis not present

## 2020-06-16 DIAGNOSIS — M25662 Stiffness of left knee, not elsewhere classified: Secondary | ICD-10-CM | POA: Diagnosis not present

## 2020-06-16 DIAGNOSIS — M25562 Pain in left knee: Secondary | ICD-10-CM

## 2020-06-16 NOTE — Therapy (Signed)
[place], [place], [address] Phone: [phone]   Fax:  [phone]  Physical Therapy Treatment  Patient Details  Name: Jennifer Foley MRN: [id number] Date of Birth: Jun 07, 1956 Referring Provider (PT): Rodell Perna, MD   Encounter Date: 06/16/2020   PT End of Session - 06/16/20 1403    Visit Number 3    Number of Visits 16    Date for PT Re-Evaluation 08/05/20    Progress Note Due on Visit 10    PT Start Time 3267    PT Stop Time 1440    PT Time Calculation (min) 42 min    Activity Tolerance Patient tolerated treatment well    Behavior During Therapy [facility] for tasks assessed/performed           Past Medical History:  Diagnosis Date   Anemia    Arthritis    per patient, in left and right knee and both hands   Atrial fibrillation (HCC)    Bilateral breast cysts    CHF (congestive heart failure) (HCC)    Chronic kidney disease    stage 3   Depression with anxiety    DM (diabetes mellitus) ([place])    Esophageal reflux    History of hiatal hernia    HTN (hypertension)    Hyperlipemia    Hyperthyroidism    Hypothyroidism    INR (international normal ratio) abnormal    MRSA (methicillin resistant staph aureus) culture positive    Panuveitis    Pneumonia    PTSD (post-traumatic stress disorder)    Sleep apnea    per patient has CPAP, wears it at night sometimes    Past Surgical History:  Procedure Laterality Date   ABDOMINAL HYSTERECTOMY     BREAST BIOPSY     CATARACT EXTRACTION  2018   [place] Left 04/21/2020   Procedure: LEFT TOTAL KNEE ARTHROPLASTY;  Surgeon: [name], MD;  Location: [facility];  Service: Orthopedics;  Laterality: Left;    There were no vitals filed for this visit.       [facility] PT  Assessment - 06/16/20 0001      AROM   Left Knee Flexion 65      PROM   Left Knee Flexion 70                         [facility] Adult PT Treatment/Exercise - 06/16/20 0001      Knee/Hip Exercises: Aerobic   Nustep Lvl 5 5 sec hold Lt knee flexion ROM 5 mins      Knee/Hip Exercises: Seated   Long Arc Quad 2 sets;10 reps;Left   Focus on end range progression   Other Seated Knee/Hip Exercises tailgait flexion c Rt LE overpressure 30 sec x 5      Manual Therapy   Manual therapy comments seated IR, distraction, flexion Lt knee c contralateral knee extension.  Contract/relax Lt quad                        PT Long Term Goals - 06/10/20 1745      PT LONG TERM GOAL #1   Title Patient will be independent in her  HEP and progression.    Baseline inital program issued on 06/10/2020    Time 8    Period Weeks    Status New    Target Date 08/05/20      PT LONG TERM GOAL #2   Title Patient able to complete 5 time sit to stand </= 18 seconds to improve balance and functional mobility.    Baseline 25.3 seconds using UE support    Time 8    Period Weeks    Status New    Target Date 08/05/20      PT LONG TERM GOAL #3   Title Patient will be able to amb with straigth cane for >/= 500 feet on community level surfaces with step through gait pattern.    Baseline currently using RW walking on househod distances    Time 8    Period Weeks    Status New      PT LONG TERM GOAL #4   Title Pt will improve her L knee flexion to >/= 90 degrees go improve funtional mobility and gait.    Baseline 50 degrees actively    Time 8    Period Weeks                 Plan - 06/16/20 1419    Clinical Impression Statement Pt. demonstrated marked impairments in Lt knee mobility, c flexion limited most.  Pt. to continue to benefit from skilled PT services to address noted impairments and improve towards goals.  Very important to gain mobility quickly to prevent contractures/jt  stiffness beyond current presentation.    Personal Factors and Comorbidities Comorbidity 3+    Comorbidities L TKA 04/21/2020, a-fib, arthritis, CHF, CKD, DM, depression, HTN, sleep apnea, PTSD, hyperthyroidism    Examination-Activity Limitations Squat;Sit;Sleep;Stairs;Stand;Lift;Transfers    Examination-Participation Restrictions Other;Church;Shop;Community Activity;Driving    Stability/Clinical Decision Making Stable/Uncomplicated    Rehab Potential Fair    PT Frequency 2x / week    PT Duration 8 weeks    PT Treatment/Interventions Cryotherapy;Electrical Stimulation;ADLs/Self Care Home Management;Ultrasound;Gait training;Stair training;Functional mobility training;Therapeutic activities;Therapeutic exercise;Moist Heat;Balance training;Neuromuscular re-education;Patient/family education;Manual techniques;Scar mobilization;Dry needling;Taping;Vasopneumatic Device;Passive range of motion    PT Next Visit Plan High focus on manual intervention to promote mobility gains, strengthening/mobility intervention c transition to standing WB activity/movement coordination as appropriate.    PT Home Exercise Plan [id number]    [id number] and Agree with Plan of Care Patient           Patient will benefit from skilled therapeutic intervention in order to improve the following deficits and impairments:  Pain, Postural dysfunction, Decreased activity tolerance, Decreased strength, Impaired flexibility, Obesity, Decreased range of motion, Increased edema, Difficulty walking, Decreased balance  Visit Diagnosis: Acute pain of left knee  Stiffness of left knee, not elsewhere classified  Difficulty in walking, not elsewhere classified  Localized edema     Problem List Patient Active Problem List   Diagnosis Date Noted   S/P total knee arthroplasty, left 06/02/2020   Noncompliance with CPAP treatment 09/24/2018   A-fib ([place]) 08/31/2018   CHF (congestive heart failure) ([place]) 08/31/2018   Acute  pyelonephritis 08/31/2018   Sepsis secondary to UTI ([place]) 08/31/2018   Leucocytosis 08/31/2018   Hypothyroidism 08/31/2018   CKD (chronic kidney disease) stage 3, GFR 30-59 ml/min 08/31/2018   HTN (hypertension) 08/31/2018   HLD (hyperlipidemia) 08/31/2018   Anxiety and depression 08/31/2018   Obesity 08/31/2018   Sepsis ([place]) 08/31/2018   Back pain 08/31/2018   Obstructive sleep apnea  treated with continuous positive airway pressure (CPAP) 08/13/2018    Scot Jun, PT, DPT, OCS, ATC 06/16/20  2:34 PM    [facility] [address] [place], [place], [address] Phone: [phone]   Fax:  [phone]  Name: Jennifer Foley MRN: [id number] Date of Birth: Mar 16, 1956

## 2020-06-23 ENCOUNTER — Encounter: Payer: Self-pay | Admitting: Orthopaedic Surgery

## 2020-06-23 ENCOUNTER — Encounter: Payer: Self-pay | Admitting: Physical Therapy

## 2020-06-23 ENCOUNTER — Other Ambulatory Visit: Payer: Self-pay

## 2020-06-23 ENCOUNTER — Ambulatory Visit (INDEPENDENT_AMBULATORY_CARE_PROVIDER_SITE_OTHER): Payer: Medicare Other | Admitting: Orthopaedic Surgery

## 2020-06-23 ENCOUNTER — Ambulatory Visit (INDEPENDENT_AMBULATORY_CARE_PROVIDER_SITE_OTHER): Payer: Medicare Other | Admitting: Physical Therapy

## 2020-06-23 VITALS — Ht 60.0 in | Wt 204.0 lb

## 2020-06-23 DIAGNOSIS — M25562 Pain in left knee: Secondary | ICD-10-CM | POA: Diagnosis not present

## 2020-06-23 DIAGNOSIS — M25662 Stiffness of left knee, not elsewhere classified: Secondary | ICD-10-CM

## 2020-06-23 DIAGNOSIS — R600 Localized edema: Secondary | ICD-10-CM | POA: Diagnosis not present

## 2020-06-23 DIAGNOSIS — M24662 Ankylosis, left knee: Secondary | ICD-10-CM

## 2020-06-23 DIAGNOSIS — R262 Difficulty in walking, not elsewhere classified: Secondary | ICD-10-CM

## 2020-06-23 DIAGNOSIS — Z96652 Presence of left artificial knee joint: Secondary | ICD-10-CM | POA: Diagnosis not present

## 2020-06-23 NOTE — Progress Notes (Signed)
Office Visit Note/H and P   Patient: Jennifer Foley           Date of Birth: [birth year]           MRN: [id number] Visit Date: 06/23/2020              Requested by: Bartholome Bill, MD [place],  [place] [address] PCP: Bartholome Bill, MD   Assessment & Plan: Visit Diagnoses:  1. S/P total knee arthroplasty, left   2. Arthrofibrosis of knee joint, left     Plan: We will set patient up for outpatient manipulation under general anesthesia with full muscle paralysis needed to decrease risk of femur fracture.  Physical therapy will need to be scheduled the following day so we can make sure we maintain her knee flexion.  Procedure discussed.  She will need to continue therapy to prevent recurrent arthrofibrosis and maintain her range of motion.  Questions were elicited and answered she understands and agrees to proceed.  Follow-Up Instructions: No follow-ups on file.   Orders:  No orders of the defined types were placed in this encounter.  No orders of the defined types were placed in this encounter.     Procedures: No procedures performed   Clinical Data: No additional findings.   Subjective: Chief Complaint  Patient presents with   Left Knee - Follow-up    04/21/2020 left TKA    HPI 64 year old female returns for follow-up after left total knee arthroplasty 04/21/2020.  Original plans were going home with family.  She moves slowly in the hospital and ended up going to a skilled nursing facility.  She has had decreased range of motion in her knee and is only at 60 to 65 degrees knee flexion.  Passively the therapist has gotten her to 70 degrees.  After nursing home stay she was at home with home therapy made minimal improvement.  She continues to have pain is been taking hydrocodone and Robaxin but is not made progress and is still has to use a walker with weak quadriceps.  Incision is well-healed no hip or groin pain.  Sensation of foot  is intact and x-ray showed good position of the prosthesis.  Review of Systems 14 system update positive for hypertension sleep apnea history of hypothyroidism obesity BMI 39.  She has gained weight since her surgery of her knee.  History noncompliance with CPAP.   Objective: Vital Signs: Ht 5' (1.524 m)    Wt 204 lb (92.5 kg)    BMI 39.84 kg/m   Physical Exam Constitutional:      Appearance: She is well-developed.  HENT:     Head: Normocephalic.     Right Ear: External ear normal.     Left Ear: External ear normal.  Eyes:     Pupils: Pupils are equal, round, and reactive to light.  Neck:     Thyroid: No thyromegaly.     Trachea: No tracheal deviation.  Cardiovascular:     Rate and Rhythm: Normal rate.  Pulmonary:     Effort: Pulmonary effort is normal.  Abdominal:     Palpations: Abdomen is soft.  Skin:    General: Skin is warm and dry.  Neurological:     Mental Status: She is alert and oriented to person, place, and time.  Psychiatric:        Behavior: Behavior normal.     Ortho Exam well-healed knee incision left knee.  Negative logroll  the hips distal pulses are intact negative straight leg raising.  She does reach full extension she has weak quad has 10 degree extension lag actively.  She ambulates with her walker.  Knee flexion to 60degrees.  No knee effusion no increased warmth to the knee. Specialty Comments:  No specialty comments available.  Imaging: No results found.   PMFS History: Patient Active Problem List   Diagnosis Date Noted   Arthrofibrosis of knee joint, left 06/24/2020   S/P total knee arthroplasty, left 06/02/2020   Noncompliance with CPAP treatment 09/24/2018   A-fib ([place]) 08/31/2018   CHF (congestive heart failure) ([place]) 08/31/2018   Acute pyelonephritis 08/31/2018   Sepsis secondary to UTI ([place]) 08/31/2018   Leucocytosis 08/31/2018   Hypothyroidism 08/31/2018   CKD (chronic kidney disease) stage 3, GFR 30-59 ml/min 08/31/2018    HTN (hypertension) 08/31/2018   HLD (hyperlipidemia) 08/31/2018   Anxiety and depression 08/31/2018   Obesity 08/31/2018   Sepsis ([place]) 08/31/2018   Back pain 08/31/2018   Obstructive sleep apnea treated with continuous positive airway pressure (CPAP) 08/13/2018   Past Medical History:  Diagnosis Date   Anemia    Arthritis    per patient, in left and right knee and both hands   Atrial fibrillation (HCC)    Bilateral breast cysts    CHF (congestive heart failure) ([place])    Chronic kidney disease    stage 3   Depression with anxiety    DM (diabetes mellitus) (HCC)    Esophageal reflux    History of hiatal hernia    HTN (hypertension)    Hyperlipemia    Hyperthyroidism    Hypothyroidism    INR (international normal ratio) abnormal    MRSA (methicillin resistant staph aureus) culture positive    Panuveitis    Pneumonia    PTSD (post-traumatic stress disorder)    Sleep apnea    per patient has CPAP, wears it at night sometimes    Family History  Problem Relation Age of Onset   Alcoholism Father    Arthritis Father    Hypertension Father        sister, mother   Diabetes Mellitus I Mother        sister   Renal Disease Mother    Stroke Mother    Epilepsy Brother     Past Surgical History:  Procedure Laterality Date   ABDOMINAL HYSTERECTOMY     BREAST BIOPSY     CATARACT EXTRACTION  2018   CESAREAN SECTION     DENTAL SURGERY     EYE SURGERY     FOOT SURGERY     HERNIA REPAIR     KNEE SURGERY     TONSILLECTOMY AND ADENOIDECTOMY     TOTAL KNEE ARTHROPLASTY Left 04/21/2020   Procedure: LEFT TOTAL KNEE ARTHROPLASTY;  Surgeon: [name], MD;  Location: [facility];  Service: Orthopedics;  Laterality: Left;   Social History   Occupational History   Not on file  Tobacco Use   Smoking status: Former Smoker    Packs/day: 0.25    Years: 34.00    Pack years: 8.50    Types: Cigarettes    Quit date: 12/15/2003    Years  since quitting: 16.5   Smokeless tobacco: Current User    Types: Snuff   Tobacco comment: 04/15/2020: per patient 1-2 times a day, trying to stop taking it  Vaping Use   Vaping Use: Never used  Substance and Sexual Activity   Alcohol  use: Not Currently   Drug use: Not on file   Sexual activity: Not on file

## 2020-06-23 NOTE — Therapy (Signed)
[place], [place], [address] Phone: [phone]   Fax:  [phone]  Physical Therapy Treatment  Patient Details  Name: Jennifer Foley MRN: [id number] Date of Birth: [birth year] Referring Provider (PT): Rodell Perna, MD   Encounter Date: 06/23/2020   PT End of Session - 06/23/20 1252    Visit Number 4    Number of Visits 16    Date for PT Re-Evaluation 08/05/20    Progress Note Due on Visit 10    PT Start Time 1159   pt arrived late   PT Stop Time 1228    PT Time Calculation (min) 29 min    Activity Tolerance Patient tolerated treatment well    Behavior During Therapy [facility] for tasks assessed/performed           Past Medical History:  Diagnosis Date  . Anemia   . Arthritis    per patient, in left and right knee and both hands  . Atrial fibrillation ([place])   . Bilateral breast cysts   . CHF (congestive heart failure) ([place])   . Chronic kidney disease    stage 3  . Depression with anxiety   . DM (diabetes mellitus) ([place])   . Esophageal reflux   . History of hiatal hernia   . HTN (hypertension)   . Hyperlipemia   . Hyperthyroidism   . Hypothyroidism   . INR (international normal ratio) abnormal   . MRSA (methicillin resistant staph aureus) culture positive   . Panuveitis   . Pneumonia   . PTSD (post-traumatic stress disorder)   . Sleep apnea    per patient has CPAP, wears it at night sometimes    Past Surgical History:  Procedure Laterality Date  . ABDOMINAL HYSTERECTOMY    . BREAST BIOPSY    . CATARACT EXTRACTION  2018  . CESAREAN SECTION    . DENTAL SURGERY    . EYE SURGERY    . FOOT SURGERY    . HERNIA REPAIR    . KNEE SURGERY    . TONSILLECTOMY AND ADENOIDECTOMY    . TOTAL KNEE ARTHROPLASTY Left 04/21/2020   Procedure: LEFT TOTAL KNEE ARTHROPLASTY;  Surgeon: [name], MD;  Location: [facility];  Service: Orthopedics;  Laterality: Left;    There were no vitals filed for this visit.    Subjective Assessment - 06/23/20 1204    Subjective Dr. Lorin Mercy wants to do a manipulation next week.    Limitations Walking;Standing;House hold activities    Diagnostic tests X-ray    Patient Stated Goals walk without a cane    Currently in Pain? Yes    Pain Score 8     Pain Location Knee    Pain Orientation Left    Pain Descriptors / Indicators Throbbing;Sharp    Pain Type Chronic pain    Pain Onset More than a month ago    Pain Frequency Intermittent    Aggravating Factors  walking or standing over 10-15 min    Pain Relieving Factors medication, ice                             [facility] Adult PT Treatment/Exercise - 06/23/20 1213      Self-Care   Self-Care Other Self-Care Comments    Other Self-Care Comments  discussed PT role s/p MUA and goal for aggressive ROM at home and in clinic; also when not exercising or up walking needs to have  knee fully extended to maximize extension      Knee/Hip Exercises: Stretches   Sports administrator Left;5 reps;30 seconds      Knee/Hip Exercises: Seated   Other Seated Knee/Hip Exercises tailgate flexion c Rt LE overpressure 10 x 10 sec    Other Seated Knee/Hip Exercises tailgate dangle x 3 min      Knee/Hip Exercises: Supine   Heel Slides AAROM;Left;10 reps                  PT Education - 06/23/20 1252    Education Details HEP, see self care    Person(s) Educated Patient    Methods Explanation;Demonstration;Handout    Comprehension Verbalized understanding;Returned demonstration               PT Long Term Goals - 06/10/20 1745      PT LONG TERM GOAL #1   Title Patient will be independent in her HEP and progression.    Baseline inital program issued on 06/10/2020    Time 8    Period Weeks    Status New    Target Date 08/05/20      PT LONG TERM GOAL #2   Title Patient able to complete 5 time sit to stand </= 18 seconds to improve balance and functional mobility.    Baseline 25.3 seconds using UE support     Time 8    Period Weeks    Status New    Target Date 08/05/20      PT LONG TERM GOAL #3   Title Patient will be able to amb with straigth cane for >/= 500 feet on community level surfaces with step through gait pattern.    Baseline currently using RW walking on househod distances    Time 8    Period Weeks    Status New      PT LONG TERM GOAL #4   Title Pt will improve her L knee flexion to >/= 90 degrees go improve funtional mobility and gait.    Baseline 50 degrees actively    Time 8    Period Weeks                 Plan - 06/23/20 1252    Clinical Impression Statement Pt came up to PT session after MD appt reporting plans for a manipulation next week.  Answered questions and provided HEP to work up until MUA as well as immediately following procedure.  Anticipate increasing frequency to 3x/wk after manipulation.    Personal Factors and Comorbidities Comorbidity 3+    Comorbidities L TKA 04/21/2020, a-fib, arthritis, CHF, CKD, DM, depression, HTN, sleep apnea, PTSD, hyperthyroidism    Examination-Activity Limitations Squat;Sit;Sleep;Stairs;Stand;Lift;Transfers    Examination-Participation Restrictions Other;Church;Shop;Community Activity;Driving    Stability/Clinical Decision Making Stable/Uncomplicated    Rehab Potential Fair    PT Frequency 2x / week    PT Duration 8 weeks    PT Treatment/Interventions Cryotherapy;Electrical Stimulation;ADLs/Self Care Home Management;Ultrasound;Gait training;Stair training;Functional mobility training;Therapeutic activities;Therapeutic exercise;Moist Heat;Balance training;Neuromuscular re-education;Patient/family education;Manual techniques;Scar mobilization;Dry needling;Taping;Vasopneumatic Device;Passive range of motion    PT Next Visit Plan see pt after MUA, aggressive ROM    PT Home Exercise Plan [email or web address]    Consulted and Agree with Plan of Care Patient           Patient will benefit from skilled therapeutic intervention in order to  improve the following deficits and impairments:  Pain, Postural dysfunction, Decreased activity tolerance, Decreased strength, Impaired flexibility, Obesity, Decreased range of motion,  Increased edema, Difficulty walking, Decreased balance  Visit Diagnosis: Acute pain of left knee  Stiffness of left knee, not elsewhere classified  Difficulty in walking, not elsewhere classified  Localized edema     Problem List Patient Active Problem List   Diagnosis Date Noted  . S/P total knee arthroplasty, left 06/02/2020  . Noncompliance with CPAP treatment 09/24/2018  . A-fib ([place]) 08/31/2018  . CHF (congestive heart failure) ([place]) 08/31/2018  . Acute pyelonephritis 08/31/2018  . Sepsis secondary to UTI ([place]) 08/31/2018  . Leucocytosis 08/31/2018  . Hypothyroidism 08/31/2018  . CKD (chronic kidney disease) stage 3, GFR 30-59 ml/min 08/31/2018  . HTN (hypertension) 08/31/2018  . HLD (hyperlipidemia) 08/31/2018  . Anxiety and depression 08/31/2018  . Obesity 08/31/2018  . Sepsis ([place]) 08/31/2018  . Back pain 08/31/2018  . Obstructive sleep apnea treated with continuous positive airway pressure (CPAP) 08/13/2018      Laureen Abrahams, PT, DPT 06/23/20 12:54 PM     [facility] [address] [place], [place], [address] Phone: [phone]   Fax:  [phone]  Name: Jennifer Foley MRN: [id number] Date of Birth: 1956-04-16

## 2020-06-23 NOTE — Patient Instructions (Signed)
Access Code: [email or web address] URL: [email or web address] Date: 06/23/2020 Prepared by: Faustino Congress  Exercises Seated Long Arc Quad - 2-3 x daily - 7 x weekly - 3 sets - 10 reps - 3 seconds hold Sit to Stand with Counter Support - 2-3 x daily - 7 x weekly - 2 sets - 10 reps Supine Knee Extension Strengthening - 2-3 x daily - 7 x weekly - 3 sets - 10 reps - 5 seconds hold Supine Active Straight Leg Raise - 2-3 x daily - 7 x weekly - 10 reps Supine Quadriceps Stretch with Strap on Table - 2-3 x daily - 7 x weekly - 1 sets - 3 reps - 30 seconds hold Supine Heel Slide with Strap - 5-6 x daily - 7 x weekly - 10 reps - 5 seconds hold Seated Knee Flexion - 5-6 x daily - 7 x weekly - 1 sets - 10 reps - 15 seconds hold Seated Knee Flexion AAROM - 5-6 x daily - 7 x weekly - 1 sets - 10 reps - 10 sec hold

## 2020-06-24 ENCOUNTER — Other Ambulatory Visit: Payer: Self-pay

## 2020-06-24 ENCOUNTER — Encounter (HOSPITAL_BASED_OUTPATIENT_CLINIC_OR_DEPARTMENT_OTHER): Payer: Self-pay | Admitting: Orthopaedic Surgery

## 2020-06-24 DIAGNOSIS — M24662 Ankylosis, left knee: Secondary | ICD-10-CM | POA: Insufficient documentation

## 2020-06-24 NOTE — Progress Notes (Signed)
Reviewed chart with Dr. Sabra Heck, Anesthesiologist at [facility]. No further clearance needed for surgery. Left voicemail with Debbie at Dr. Lorin Mercy office to make them aware patient will need to stay in [place] overnight as she does not have anyone to take care of her after surgery. Patient is also on Eliquis and will need orders to stop if Dr. Lorin Mercy would like.

## 2020-06-25 ENCOUNTER — Encounter: Payer: Medicare Other | Admitting: Rehabilitative and Restorative Service Providers"

## 2020-06-26 ENCOUNTER — Other Ambulatory Visit (HOSPITAL_COMMUNITY)
Admission: RE | Admit: 2020-06-26 | Discharge: 2020-06-26 | Disposition: A | Discharge status: Home / Self Care | Payer: Medicare Other | Source: Ambulatory Visit | Attending: Orthopaedic Surgery | Admitting: Orthopaedic Surgery

## 2020-06-26 DIAGNOSIS — Z01812 Encounter for preprocedural laboratory examination: Secondary | ICD-10-CM | POA: Insufficient documentation

## 2020-06-26 DIAGNOSIS — Z20822 Contact with and (suspected) exposure to covid-19: Secondary | ICD-10-CM | POA: Diagnosis not present

## 2020-06-26 LAB — SARS CORONAVIRUS 2 (TAT 6-24 HRS): SARS Coronavirus 2: NEGATIVE

## 2020-06-29 ENCOUNTER — Encounter (HOSPITAL_BASED_OUTPATIENT_CLINIC_OR_DEPARTMENT_OTHER)
Admission: RE | Admit: 2020-06-29 | Discharge: 2020-06-29 | Disposition: A | Discharge status: Home / Self Care | Payer: Medicare Other | Source: Ambulatory Visit | Attending: Orthopaedic Surgery | Admitting: Orthopaedic Surgery

## 2020-06-29 ENCOUNTER — Other Ambulatory Visit: Payer: Self-pay | Admitting: Orthopaedic Surgery

## 2020-06-29 ENCOUNTER — Telehealth: Payer: Self-pay | Admitting: Orthopaedic Surgery

## 2020-06-29 DIAGNOSIS — Z01812 Encounter for preprocedural laboratory examination: Secondary | ICD-10-CM | POA: Insufficient documentation

## 2020-06-29 LAB — BASIC METABOLIC PANEL
Anion gap: 11 (ref 5–15)
BUN: 15 mg/dL (ref 8–23)
CO2: 27 mmol/L (ref 22–32)
Calcium: 9.3 mg/dL (ref 8.9–10.3)
Chloride: 103 mmol/L (ref 98–111)
Creatinine, Ser: 1.43 mg/dL — ABNORMAL HIGH (ref 0.44–1.00)
GFR calc Af Amer: 45 mL/min — ABNORMAL LOW (ref >60)
GFR calc non Af Amer: 39 mL/min — ABNORMAL LOW (ref >60)
Glucose, Bld: 125 mg/dL — ABNORMAL HIGH (ref 70–99)
Potassium: 4.8 mmol/L (ref 3.5–5.1)
Sodium: 141 mmol/L (ref 135–145)

## 2020-06-29 MED ORDER — HYDROCODONE-ACETAMINOPHEN 5-325 MG PO TABS
1.0000 | ORAL_TABLET | Freq: Four times a day (QID) | ORAL | 0 refills | Status: DC | PRN
Start: 2020-06-29 — End: 2020-07-16

## 2020-06-29 NOTE — Telephone Encounter (Signed)
I called and lm on vm to advise that rx has been sent to pharm. To call with any questions.

## 2020-06-29 NOTE — Telephone Encounter (Signed)
Ucall, I sent in refill norco thanks

## 2020-06-29 NOTE — Telephone Encounter (Signed)
Sch for left knee MUA tomorrow and would like to know if she can have something for pain today.

## 2020-06-29 NOTE — Telephone Encounter (Signed)
Pt called stating she's experiencing pain but she is scheduled for surgery on 06/30/20 and would like to know if there's anything she can get a refill of her hydrocodone or be prescribed something to help her until then?  [phone]

## 2020-06-30 ENCOUNTER — Encounter: Payer: Medicare Other | Admitting: Physical Therapy

## 2020-07-01 ENCOUNTER — Encounter: Payer: Medicare Other | Admitting: Physical Therapy

## 2020-07-01 ENCOUNTER — Telehealth: Payer: Self-pay | Admitting: Physical Therapy

## 2020-07-01 NOTE — Telephone Encounter (Signed)
Called pt as she NS for PT appt today.  She reports unable to have MUA yesterday as she didn't stop her blood thinners prior to the procedure.  She is hoping to have it rescheduled soon.  Canceled 9/13 appt at this time, and pt will call once MUA is scheduled, so we can adjust schedule accordingly.  Laureen Abrahams, PT, DPT 07/01/20 10:48 AM

## 2020-07-05 ENCOUNTER — Encounter: Payer: Medicare Other | Admitting: Physical Therapy

## 2020-07-07 ENCOUNTER — Inpatient Hospital Stay: Payer: Medicare Other | Admitting: Orthopaedic Surgery

## 2020-07-07 ENCOUNTER — Ambulatory Visit (INDEPENDENT_AMBULATORY_CARE_PROVIDER_SITE_OTHER): Payer: Medicare Other | Admitting: Physical Therapy

## 2020-07-07 ENCOUNTER — Encounter: Payer: Self-pay | Admitting: Physical Therapy

## 2020-07-07 ENCOUNTER — Other Ambulatory Visit: Payer: Self-pay

## 2020-07-07 ENCOUNTER — Telehealth: Payer: Self-pay | Admitting: Orthopaedic Surgery

## 2020-07-07 DIAGNOSIS — R262 Difficulty in walking, not elsewhere classified: Secondary | ICD-10-CM | POA: Diagnosis not present

## 2020-07-07 DIAGNOSIS — M25662 Stiffness of left knee, not elsewhere classified: Secondary | ICD-10-CM

## 2020-07-07 DIAGNOSIS — M25562 Pain in left knee: Secondary | ICD-10-CM | POA: Diagnosis not present

## 2020-07-07 DIAGNOSIS — R600 Localized edema: Secondary | ICD-10-CM | POA: Diagnosis not present

## 2020-07-07 NOTE — Telephone Encounter (Signed)
Patient called requesting another pair of support panty hose Dr. Lorin Mercy gave her awhile back. Patient states the ones she was given is torn and woren out. Patient states she has a physical therapy appt today at 3:15 pm and would like Dr. Lorin Mercy to have a pair at the desk after her pt appt. Patient phone number is [phone].

## 2020-07-08 NOTE — Telephone Encounter (Signed)
I called patient, no answer. Will try again. Scheduled for PT tomorrow afternoon.

## 2020-07-08 NOTE — Telephone Encounter (Signed)
I'm not sure what he would need? Do you guys have a regular TED that you use? I can help do whatever you need me to

## 2020-07-08 NOTE — Telephone Encounter (Signed)
OK to do thanks 

## 2020-07-08 NOTE — Therapy (Signed)
[place], [place], [address] Phone: [phone]   Fax:  [phone]  Physical Therapy Treatment  Patient Details  Name: Jennifer Foley MRN: [id number] Date of Birth: 1956/06/27 Referring Provider (PT): Rodell Perna, MD   Encounter Date: 07/07/2020   PT End of Session - 07/07/20 1519    Visit Number 5    Number of Visits 16    Date for PT Re-Evaluation 08/05/20    Progress Note Due on Visit 10    PT Start Time 9417    PT Stop Time 1600    PT Time Calculation (min) 45 min    Activity Tolerance Patient tolerated treatment well    Behavior During Therapy [facility] for tasks assessed/performed           Past Medical History:  Diagnosis Date   Anemia    Arthritis    per patient, in left and right knee and both hands   Atrial fibrillation (HCC)    Bilateral breast cysts    CHF (congestive heart failure) (HCC)    Chronic kidney disease    stage 3   Depression with anxiety    DM (diabetes mellitus) ([place])    Esophageal reflux    History of hiatal hernia    HTN (hypertension)    Hyperlipemia    Hyperthyroidism    Hypothyroidism    INR (international normal ratio) abnormal    MRSA (methicillin resistant staph aureus) culture positive    Panuveitis    Pneumonia    PTSD (post-traumatic stress disorder)    Sleep apnea    per patient has CPAP, wears it at night sometimes    Past Surgical History:  Procedure Laterality Date   ABDOMINAL HYSTERECTOMY     BREAST BIOPSY     CATARACT EXTRACTION  2018   [place] Left 04/21/2020   Procedure: LEFT TOTAL KNEE ARTHROPLASTY;  Surgeon: [name], MD;  Location: [place];  Service: Orthopedics;  Laterality: Left;    There were no vitals filed for this visit.   Subjective Assessment -  07/07/20 1515    Subjective Manipulation is rescheduled for 9/24.  She reports doing all of her exercises but only some days and then only some the other days.    Limitations Walking;Standing;House hold activities    Diagnostic tests X-ray    Patient Stated Goals walk without a cane    Currently in Pain? Yes    Pain Score 6     Pain Location Knee    Pain Orientation Left;Anterior;Medial    Pain Descriptors / Indicators Aching;Sore;Sharp    Pain Type Chronic pain    Pain Onset More than a month ago    Aggravating Factors  bending knee    Pain Relieving Factors medication and sit down                             [facility] Adult PT Treatment/Exercise - 07/07/20 1515      Knee/Hip Exercises: Stretches   Passive Hamstring Stretch Left;3 reps;10 seconds    Passive Hamstring Stretch Limitations PT passive SLR    Quad Stretch Left;5 reps;10 seconds    Quad Stretch Limitations supine  with LLE over edge of mat with pt flexing with sheet tied around ankle & PT manual assist.     Gastroc Stretch Left;3 reps;10 seconds    Gastroc Stretch Limitations seated at edge of chair with LLE extended & strap for DF      Knee/Hip Exercises: Aerobic   Nustep Seat 8 level 5 10 minutes with 5 second hold left knee flexion.       Knee/Hip Exercises: Seated   Long Arc Quad 2 sets;10 reps;Left    Long Arc Quad Limitations cues on motion    Heel Slides AAROM;Left;1 set;10 reps    Heel Slides Limitations weight shift to right, LLE heel slide, then sit up straight with weight on BLEs      Knee/Hip Exercises: Supine   Short Arc Quad Sets AROM;Strengthening;Left;1 set;15 reps    Heel Slides AAROM;Left;10 reps    Heel Slides Limitations 1st set sliding on mat table;  2nd set LLE on 55cm ball and rolling for maximal knee flexion.       Vasopneumatic   Number Minutes Vasopneumatic  10 minutes    Vasopnuematic Location  Knee    Vasopneumatic Pressure Medium    Vasopneumatic Temperature  34                        PT Long Term Goals - 06/10/20 1745      PT LONG TERM GOAL #1   Title Patient will be independent in her HEP and progression.    Baseline inital program issued on 06/10/2020    Time 8    Period Weeks    Status New    Target Date 08/05/20      PT LONG TERM GOAL #2   Title Patient able to complete 5 time sit to stand </= 18 seconds to improve balance and functional mobility.    Baseline 25.3 seconds using UE support    Time 8    Period Weeks    Status New    Target Date 08/05/20      PT LONG TERM GOAL #3   Title Patient will be able to amb with straigth cane for >/= 500 feet on community level surfaces with step through gait pattern.    Baseline currently using RW walking on househod distances    Time 8    Period Weeks    Status New      PT LONG TERM GOAL #4   Title Pt will improve her L knee flexion to >/= 90 degrees go improve funtional mobility and gait.    Baseline 50 degrees actively    Time 8    Period Weeks                 Plan - 07/07/20 1822    Clinical Impression Statement PT instructed patient in need to do all exercises in HEP even if she breaks it up over day. PT cued on ways to build into her daily routine. PT educated on need for exercises after manipulation to maximize outcome.  Pt continues to limit exercises & activity due to pain.    Personal Factors and Comorbidities Comorbidity 3+    Comorbidities L TKA 04/21/2020, a-fib, arthritis, CHF, CKD, DM, depression, HTN, sleep apnea, PTSD, hyperthyroidism    Examination-Activity Limitations Squat;Sit;Sleep;Stairs;Stand;Lift;Transfers    Examination-Participation Restrictions Other;Church;Shop;Community Activity;Driving    Stability/Clinical Decision Making Stable/Uncomplicated    Rehab Potential Fair    PT Frequency 2x / week  PT Duration 8 weeks    PT Treatment/Interventions Cryotherapy;Electrical Stimulation;ADLs/Self Care Home Management;Ultrasound;Gait training;Stair  training;Functional mobility training;Therapeutic activities;Therapeutic exercise;Moist Heat;Balance training;Neuromuscular re-education;Patient/family education;Manual techniques;Scar mobilization;Dry needling;Taping;Vasopneumatic Device;Passive range of motion    PT Next Visit Plan agressive range, progress exercises as able,    PT Home Exercise Plan [email or web address]    Consulted and Agree with Plan of Care Patient           Patient will benefit from skilled therapeutic intervention in order to improve the following deficits and impairments:  Pain, Postural dysfunction, Decreased activity tolerance, Decreased strength, Impaired flexibility, Obesity, Decreased range of motion, Increased edema, Difficulty walking, Decreased balance  Visit Diagnosis: Acute pain of left knee  Stiffness of left knee, not elsewhere classified  Difficulty in walking, not elsewhere classified  Localized edema     Problem List Patient Active Problem List   Diagnosis Date Noted   Arthrofibrosis of knee joint, left 06/24/2020   S/P total knee arthroplasty, left 06/02/2020   Noncompliance with CPAP treatment 09/24/2018   A-fib ([place]) 08/31/2018   CHF (congestive heart failure) ([place]) 08/31/2018   Acute pyelonephritis 08/31/2018   Sepsis secondary to UTI ([place]) 08/31/2018   Leucocytosis 08/31/2018   Hypothyroidism 08/31/2018   CKD (chronic kidney disease) stage 3, GFR 30-59 ml/min 08/31/2018   HTN (hypertension) 08/31/2018   HLD (hyperlipidemia) 08/31/2018   Anxiety and depression 08/31/2018   Obesity 08/31/2018   Sepsis ([place]) 08/31/2018   Back pain 08/31/2018   Obstructive sleep apnea treated with continuous positive airway pressure (CPAP) 08/13/2018    Jamey Reas PT, DPT 07/08/2020, 8:26 AM  [facility] Physical Therapy [address] [place], [place], [address] Phone: [phone]   Fax:  [phone]  Name: Jennifer Foley MRN: [id number] Date of Birth:  Jan 16, 1956

## 2020-07-08 NOTE — Telephone Encounter (Signed)
I called and spoke with patient. She has thigh high TEDS. I advised I can put rx for these at front desk for her to pick up. She would like to know all of the different places that she can get these so that she can get them somewhere closer to where she lives. I advised I would ask and put locations with rx in envelope at front for her to pick up.

## 2020-07-09 ENCOUNTER — Encounter: Payer: Self-pay | Admitting: Rehabilitative and Restorative Service Providers"

## 2020-07-12 ENCOUNTER — Ambulatory Visit (INDEPENDENT_AMBULATORY_CARE_PROVIDER_SITE_OTHER): Payer: Medicare Other | Admitting: Physical Therapy

## 2020-07-12 ENCOUNTER — Telehealth: Payer: Self-pay

## 2020-07-12 ENCOUNTER — Encounter: Payer: Self-pay | Admitting: Physical Therapy

## 2020-07-12 DIAGNOSIS — M25662 Stiffness of left knee, not elsewhere classified: Secondary | ICD-10-CM

## 2020-07-12 DIAGNOSIS — R600 Localized edema: Secondary | ICD-10-CM

## 2020-07-12 DIAGNOSIS — M25562 Pain in left knee: Secondary | ICD-10-CM

## 2020-07-12 DIAGNOSIS — R262 Difficulty in walking, not elsewhere classified: Secondary | ICD-10-CM | POA: Diagnosis not present

## 2020-07-12 NOTE — Pre-Procedure Instructions (Signed)
Your procedure is scheduled on Friday, September 24th at 9:30a.m.Marland Kitchen  Report to [facility] Main Entrance "A" at 5:30 A.M., and check in at the Admitting office.  Call this number if you have problems the morning of surgery:  [phone]  Call [phone] if you have any questions prior to your surgery date Monday-Friday 8am-4pm    Remember:  Do not eat after midnight the night before your surgery  You may drink clear liquids until 6:30a.m. the morning of your surgery.   Clear liquids allowed are: Water, Non-Citrus Juices (without pulp), Carbonated Beverages, Clear Tea, Black Coffee Only, and Gatorade. Diabetics please choose diet or no sugar options.   Enhanced Recovery after Surgery for Orthopedics Enhanced Recovery after Surgery is a protocol used to improve the stress on your body and your recovery after surgery.  Patient Instructions  . The night before surgery:  o No food after midnight. ONLY clear liquids after midnight  . The day of surgery (if you have diabetes): o  o Drink ONE (1) Gatorade 2 (G2) by 6:30 a.m. the morning of surgery o This drink was given to you during your hospital  pre-op appointment visit.  o Color of the Gatorade may vary. Red is not allowed. o Nothing else to drink after completing the  Gatorade 2 (G2).         If you have questions, please contact your surgeon's office.     Take these medicines the morning of surgery with A SIP OF WATER  azaTHIOprine (IMURAN)  buPROPion (WELLBUTRIN XL) busPIRone (BUSPAR)  levothyroxine (SYNTHROID, LEVOTHROID)  metoprolol succinate (TOPROL-XL)  omeprazole (PRILOSEC)  prednisoLONE acetate (PRED FORTE) rosuvastatin (CRESTOR)   As needed: budesonide-formoterol (SYMBICORT) fluticasone (FLONASE)  HYDROcodone-acetaminophen (NORCO/VICODIN)  levalbuterol (XOPENEX HFA) methocarbamol (ROBAXIN)   Bring all inhalers with you to the hospital.  Per your cardiologist, hold ELIQUIS two days prior to surgery.   As  of today, STOP taking any Aspirin (unless otherwise instructed by your surgeon) Aleve, Naproxen, Ibuprofen, Motrin, Advil, Goody's, BC's, all herbal medications, fish oil, and all vitamins.                      Do not wear jewelry, make up, or nail polish.            Do not wear lotions, powders, perfumes, or deodorant.            Do not shave 48 hours prior to surgery.              Do not bring valuables to the hospital.            [facility] is not responsible for any belongings or valuables.  Do NOT Smoke (Tobacco/Vaping) or drink Alcohol 24 hours prior to your procedure If you use a CPAP at night, you may bring all equipment for your overnight stay.   Contacts, glasses, dentures or bridgework may not be worn into surgery.      For patients admitted to the hospital, discharge time will be determined by your treatment team.   Patients discharged the day of surgery will not be allowed to drive home, and someone needs to stay with them for 24 hours.    Special instructions:   [place]- Preparing For Surgery  Before surgery, you can play an important role. Because skin is not sterile, your skin needs to be as free of germs as possible. You can reduce the number of germs on your skin by washing  with CHG (chlorahexidine gluconate) Soap before surgery.  CHG is an antiseptic cleaner which kills germs and bonds with the skin to continue killing germs even after washing.    Oral Hygiene is also important to reduce your risk of infection.  Remember - BRUSH YOUR TEETH THE MORNING OF SURGERY WITH YOUR REGULAR TOOTHPASTE  Please do not use if you have an allergy to CHG or antibacterial soaps. If your skin becomes reddened/irritated stop using the CHG.  Do not shave (including legs and underarms) for at least 48 hours prior to first CHG shower. It is OK to shave your face.  Please follow these instructions carefully.   1. Shower the NIGHT BEFORE SURGERY and the MORNING OF SURGERY with CHG  Soap.   2. If you chose to wash your hair, wash your hair first as usual with your normal shampoo.  3. After you shampoo, rinse your hair and body thoroughly to remove the shampoo.  4. Use CHG as you would any other liquid soap. You can apply CHG directly to the skin and wash gently with a scrungie or a clean washcloth.   5. Apply the CHG Soap to your body ONLY FROM THE NECK DOWN.  Do not use on open wounds or open sores. Avoid contact with your eyes, ears, mouth and genitals (private parts). Wash Face and genitals (private parts)  with your normal soap.   6. Wash thoroughly, paying special attention to the area where your surgery will be performed.  7. Thoroughly rinse your body with warm water from the neck down.  8. DO NOT shower/wash with your normal soap after using and rinsing off the CHG Soap.  9. Pat yourself dry with a CLEAN TOWEL.  10. Wear CLEAN PAJAMAS to bed the night before surgery  11. Place CLEAN SHEETS on your bed the night of your first shower and DO NOT SLEEP WITH PETS.   Day of Surgery: Wear Clean/Comfortable clothing the morning of surgery Do not apply any deodorants/lotions.   Remember to brush your teeth WITH YOUR REGULAR TOOTHPASTE.   Please read over the following fact sheets that you were given.

## 2020-07-12 NOTE — Telephone Encounter (Signed)
Please see below.

## 2020-07-12 NOTE — Telephone Encounter (Signed)
Patient would like a call regarding information about her surgery call back:[phone]

## 2020-07-12 NOTE — Telephone Encounter (Signed)
Rx at front for patient.

## 2020-07-12 NOTE — Therapy (Signed)
[place], [place], [address] Phone: [phone]   Fax:  [phone]  Physical Therapy Treatment  Patient Details  Name: Jennifer Foley MRN: [id number] Date of Birth: [birth year] Referring Provider (PT): Rodell Perna, MD   Encounter Date: 07/12/2020   PT End of Session - 07/12/20 1512    Visit Number 6    Number of Visits 16    Date for PT Re-Evaluation 08/05/20    Progress Note Due on Visit 10    PT Start Time 1660    PT Stop Time 1511    PT Time Calculation (min) 43 min    Activity Tolerance Patient tolerated treatment well    Behavior During Therapy [facility] for tasks assessed/performed           Past Medical History:  Diagnosis Date  . Anemia   . Arthritis    per patient, in left and right knee and both hands  . Atrial fibrillation ([place])   . Bilateral breast cysts   . CHF (congestive heart failure) ([place])   . Chronic kidney disease    stage 3  . Depression with anxiety   . DM (diabetes mellitus) ([place])   . Esophageal reflux   . History of hiatal hernia   . HTN (hypertension)   . Hyperlipemia   . Hyperthyroidism   . Hypothyroidism   . INR (international normal ratio) abnormal   . MRSA (methicillin resistant staph aureus) culture positive   . Panuveitis   . Pneumonia   . PTSD (post-traumatic stress disorder)   . Sleep apnea    per patient has CPAP, wears it at night sometimes    Past Surgical History:  Procedure Laterality Date  . ABDOMINAL HYSTERECTOMY    . BREAST BIOPSY    . CATARACT EXTRACTION  2018  . CESAREAN SECTION    . DENTAL SURGERY    . EYE SURGERY    . FOOT SURGERY    . HERNIA REPAIR    . KNEE SURGERY    . TONSILLECTOMY AND ADENOIDECTOMY    . TOTAL KNEE ARTHROPLASTY Left 04/21/2020   Procedure: LEFT TOTAL KNEE ARTHROPLASTY;  Surgeon: [name], MD;  Location: [place];  Service: Orthopedics;  Laterality: Left;    There were no vitals filed for this visit.   Subjective Assessment -  07/12/20 1436    Subjective knee is still tight - feels like it's a little better than it was    Limitations Walking;Standing;House hold activities    Diagnostic tests X-ray    Patient Stated Goals walk without a cane    Currently in Pain? Yes    Pain Score 8     Pain Location Knee    Pain Orientation Left    Pain Descriptors / Indicators Aching;Sore;Sharp    Pain Type Surgical pain;Chronic pain    Pain Onset More than a month ago    Pain Frequency Intermittent    Aggravating Factors  bending knee    Pain Relieving Factors meds and sitting down              [facility] PT Assessment - 07/12/20 1504      AROM   Left Knee Flexion 57                         [facility] Adult PT Treatment/Exercise - 07/12/20 1438      Knee/Hip Exercises: Stretches   Knee: Self-Stretch Limitations seated knee flexion with  hip scoot forward 5 x 20 sec each    Other Knee/Hip Stretches seated tailgate flexion with RLE overpressure 10 x 10 sec hold    Other Knee/Hip Stretches knee flexion stretch on knee extension machine 5 x 60 sec hold (10# on weight)      Knee/Hip Exercises: Aerobic   Nustep Seat 7 level 5 10 minutes with 5 second hold left knee flexion.                        PT Long Term Goals - 06/10/20 1745      PT LONG TERM GOAL #1   Title Patient will be independent in her HEP and progression.    Baseline inital program issued on 06/10/2020    Time 8    Period Weeks    Status New    Target Date 08/05/20      PT LONG TERM GOAL #2   Title Patient able to complete 5 time sit to stand </= 18 seconds to improve balance and functional mobility.    Baseline 25.3 seconds using UE support    Time 8    Period Weeks    Status New    Target Date 08/05/20      PT LONG TERM GOAL #3   Title Patient will be able to amb with straigth cane for >/= 500 feet on community level surfaces with step through gait pattern.    Baseline currently using RW walking on househod distances     Time 8    Period Weeks    Status New      PT LONG TERM GOAL #4   Title Pt will improve her L knee flexion to >/= 90 degrees go improve funtional mobility and gait.    Baseline 50 degrees actively    Time 8    Period Weeks                 Plan - 07/12/20 1513    Clinical Impression Statement Session focused on continued emphasis on Lt knee flexion needing frequent seated rest breaks due to pain.  Scheduled for MUA Friday so will see after procedure.    Personal Factors and Comorbidities Comorbidity 3+    Comorbidities L TKA 04/21/2020, a-fib, arthritis, CHF, CKD, DM, depression, HTN, sleep apnea, PTSD, hyperthyroidism    Examination-Activity Limitations Squat;Sit;Sleep;Stairs;Stand;Lift;Transfers    Examination-Participation Restrictions Other;Church;Shop;Community Activity;Driving    Stability/Clinical Decision Making Stable/Uncomplicated    Rehab Potential Fair    PT Frequency 2x / week    PT Duration 8 weeks    PT Treatment/Interventions Cryotherapy;Electrical Stimulation;ADLs/Self Care Home Management;Ultrasound;Gait training;Stair training;Functional mobility training;Therapeutic activities;Therapeutic exercise;Moist Heat;Balance training;Neuromuscular re-education;Patient/family education;Manual techniques;Scar mobilization;Dry needling;Taping;Vasopneumatic Device;Passive range of motion    PT Next Visit Plan will likely need recert due to MUA - measure, aggressive ROM    PT Home Exercise Plan [email or web address]    Consulted and Agree with Plan of Care Patient           Patient will benefit from skilled therapeutic intervention in order to improve the following deficits and impairments:  Pain, Postural dysfunction, Decreased activity tolerance, Decreased strength, Impaired flexibility, Obesity, Decreased range of motion, Increased edema, Difficulty walking, Decreased balance  Visit Diagnosis: Acute pain of left knee  Stiffness of left knee, not elsewhere  classified  Difficulty in walking, not elsewhere classified  Localized edema     Problem List Patient Active Problem List   Diagnosis Date Noted  . Arthrofibrosis  of knee joint, left 06/24/2020  . S/P total knee arthroplasty, left 06/02/2020  . Noncompliance with CPAP treatment 09/24/2018  . A-fib ([place]) 08/31/2018  . CHF (congestive heart failure) ([place]) 08/31/2018  . Acute pyelonephritis 08/31/2018  . Sepsis secondary to UTI ([place]) 08/31/2018  . Leucocytosis 08/31/2018  . Hypothyroidism 08/31/2018  . CKD (chronic kidney disease) stage 3, GFR 30-59 ml/min 08/31/2018  . HTN (hypertension) 08/31/2018  . HLD (hyperlipidemia) 08/31/2018  . Anxiety and depression 08/31/2018  . Obesity 08/31/2018  . Sepsis ([place]) 08/31/2018  . Back pain 08/31/2018  . Obstructive sleep apnea treated with continuous positive airway pressure (CPAP) 08/13/2018      Laureen Abrahams, PT, DPT 07/12/20 3:14 PM     [facility] [address] [place], [place], [address] Phone: [phone]   Fax:  [phone]  Name: Jennifer Foley MRN: [id number] Date of Birth: 1956/09/10

## 2020-07-13 ENCOUNTER — Encounter (HOSPITAL_COMMUNITY)
Admission: RE | Admit: 2020-07-13 | Discharge: 2020-07-13 | Disposition: A | Discharge status: Home / Self Care | Payer: Medicare Other | Source: Ambulatory Visit | Attending: Orthopaedic Surgery | Admitting: Orthopaedic Surgery

## 2020-07-13 ENCOUNTER — Telehealth: Payer: Self-pay | Admitting: Orthopaedic Surgery

## 2020-07-13 ENCOUNTER — Other Ambulatory Visit (HOSPITAL_COMMUNITY)
Admission: RE | Admit: 2020-07-13 | Discharge: 2020-07-13 | Disposition: A | Discharge status: Home / Self Care | Payer: Medicare Other | Source: Ambulatory Visit | Attending: Orthopaedic Surgery | Admitting: Orthopaedic Surgery

## 2020-07-13 ENCOUNTER — Other Ambulatory Visit: Payer: Self-pay

## 2020-07-13 ENCOUNTER — Encounter (HOSPITAL_COMMUNITY): Payer: Self-pay

## 2020-07-13 DIAGNOSIS — Z7901 Long term (current) use of anticoagulants: Secondary | ICD-10-CM | POA: Insufficient documentation

## 2020-07-13 DIAGNOSIS — Z87891 Personal history of nicotine dependence: Secondary | ICD-10-CM | POA: Insufficient documentation

## 2020-07-13 DIAGNOSIS — M24662 Ankylosis, left knee: Secondary | ICD-10-CM | POA: Insufficient documentation

## 2020-07-13 DIAGNOSIS — N183 Chronic kidney disease, stage 3 unspecified: Secondary | ICD-10-CM | POA: Insufficient documentation

## 2020-07-13 DIAGNOSIS — Z79899 Other long term (current) drug therapy: Secondary | ICD-10-CM | POA: Diagnosis not present

## 2020-07-13 DIAGNOSIS — E785 Hyperlipidemia, unspecified: Secondary | ICD-10-CM | POA: Insufficient documentation

## 2020-07-13 DIAGNOSIS — I509 Heart failure, unspecified: Secondary | ICD-10-CM | POA: Insufficient documentation

## 2020-07-13 DIAGNOSIS — E1122 Type 2 diabetes mellitus with diabetic chronic kidney disease: Secondary | ICD-10-CM | POA: Diagnosis not present

## 2020-07-13 DIAGNOSIS — E039 Hypothyroidism, unspecified: Secondary | ICD-10-CM | POA: Insufficient documentation

## 2020-07-13 DIAGNOSIS — I480 Paroxysmal atrial fibrillation: Secondary | ICD-10-CM | POA: Diagnosis not present

## 2020-07-13 DIAGNOSIS — I130 Hypertensive heart and chronic kidney disease with heart failure and stage 1 through stage 4 chronic kidney disease, or unspecified chronic kidney disease: Secondary | ICD-10-CM | POA: Insufficient documentation

## 2020-07-13 DIAGNOSIS — G4733 Obstructive sleep apnea (adult) (pediatric): Secondary | ICD-10-CM | POA: Insufficient documentation

## 2020-07-13 DIAGNOSIS — Z20822 Contact with and (suspected) exposure to covid-19: Secondary | ICD-10-CM | POA: Insufficient documentation

## 2020-07-13 DIAGNOSIS — Z01812 Encounter for preprocedural laboratory examination: Secondary | ICD-10-CM | POA: Insufficient documentation

## 2020-07-13 DIAGNOSIS — Z6839 Body mass index (BMI) 39.0-39.9, adult: Secondary | ICD-10-CM | POA: Diagnosis not present

## 2020-07-13 DIAGNOSIS — E119 Type 2 diabetes mellitus without complications: Secondary | ICD-10-CM | POA: Insufficient documentation

## 2020-07-13 LAB — CBC
HCT: 37 % (ref 36.0–46.0)
Hemoglobin: 11.1 g/dL — ABNORMAL LOW (ref 12.0–15.0)
MCH: 27.8 pg (ref 26.0–34.0)
MCHC: 30 g/dL (ref 30.0–36.0)
MCV: 92.7 fL (ref 80.0–100.0)
Platelets: 244 10*3/uL (ref 150–400)
RBC: 3.99 MIL/uL (ref 3.87–5.11)
RDW: 13.5 % (ref 11.5–15.5)
WBC: 6.4 10*3/uL (ref 4.0–10.5)
nRBC: 0 % (ref 0.0–0.2)

## 2020-07-13 LAB — COMPREHENSIVE METABOLIC PANEL
ALT: 9 U/L (ref 0–44)
AST: 11 U/L — ABNORMAL LOW (ref 15–41)
Albumin: 3.5 g/dL (ref 3.5–5.0)
Alkaline Phosphatase: 65 U/L (ref 38–126)
Anion gap: 9 (ref 5–15)
BUN: 15 mg/dL (ref 8–23)
CO2: 28 mmol/L (ref 22–32)
Calcium: 9 mg/dL (ref 8.9–10.3)
Chloride: 104 mmol/L (ref 98–111)
Creatinine, Ser: 1.42 mg/dL — ABNORMAL HIGH (ref 0.44–1.00)
GFR calc Af Amer: 45 mL/min — ABNORMAL LOW (ref >60)
GFR calc non Af Amer: 39 mL/min — ABNORMAL LOW (ref >60)
Glucose, Bld: 140 mg/dL — ABNORMAL HIGH (ref 70–99)
Potassium: 3.7 mmol/L (ref 3.5–5.1)
Sodium: 141 mmol/L (ref 135–145)
Total Bilirubin: 0.6 mg/dL (ref 0.3–1.2)
Total Protein: 6.7 g/dL (ref 6.5–8.1)

## 2020-07-13 LAB — HEMOGLOBIN A1C
Hgb A1c MFr Bld: 6.3 % — ABNORMAL HIGH (ref 4.8–5.6)
Mean Plasma Glucose: 134.11 mg/dL

## 2020-07-13 LAB — GLUCOSE, CAPILLARY: Glucose-Capillary: 148 mg/dL — ABNORMAL HIGH (ref 70–99)

## 2020-07-13 LAB — SARS CORONAVIRUS 2 (TAT 6-24 HRS): SARS Coronavirus 2: NEGATIVE

## 2020-07-13 NOTE — Telephone Encounter (Signed)
They may have them at the hospital , likely we can put them on her

## 2020-07-13 NOTE — Progress Notes (Signed)
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 She is s/p left TKA on 04/21/20.   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 She had a false positive stress test in 12/2018 with 02/21/19 LHC showing normal coronaries. BMI is 39.51, consistent with obesity/borderline morbid obesity.  Cardiologist Dr. Terrence Foley cleared patient from a cardiac standpoint with permission to hold Xarelto for 2 days prior to surgery. I did confirm with Debbie at Dr. Lorin Mercy office that he did want patient to hold Xarelto for procedure.   07/13/20 preoperative COVID-19 test is in process. Anesthesia team to evaluate on the day of surgery.   VS: BP 125/71   Pulse 78   Temp 36.8 C (Oral)   Resp 18   Ht 5' (1.524 m)   Wt 91.8 kg   SpO2 100%   BMI 39.51 kg/m    PROVIDERS: Bartholome Bill, MD is PCP ([place]) - Charolette Forward, MD is primary cardiologist. 02/24/20 office note is scanned under Media tab. Previously she saw Jennifer Bud, MD with [facility] (see Care Everywhere). She also saw EP cardiologist Jennifer Foley, Will, MD in 09/2017 for PAF and was in SR at that time.  Jennifer Hick, MD is nephrologist. As of 07/02/19 visit (scanned under Media tab), Cr was stable ~ 1.4.  - Jennifer Rist, MD is pulmonologist (for OSA). Last visit 01/15/20. Jennifer Foley, MDis ophthalmologist ([place]. Last visit 11/11/19.   LABS: Labs reviewed: Acceptable for surgery. (all labs ordered are listed, but only abnormal results are displayed)  Labs Reviewed  CBC - Abnormal; Notable for the following components:      Result Value   Hemoglobin 11.1 (*)    All other components within normal limits  COMPREHENSIVE METABOLIC PANEL - Abnormal; Notable for the following components:   Glucose, Bld 140 (*)    Creatinine, Ser 1.42 (*)    AST 11 (*)    GFR calc non Af Amer 39 (*)    GFR calc Af Amer 45 (*)    All other components within normal limits  GLUCOSE, CAPILLARY - Abnormal; Notable for the following components:   Glucose-Capillary 148 (*)    All other components within normal limits  HEMOGLOBIN A1C - Abnormal; Notable for the following components:   Hgb A1c MFr Bld 6.3 (*)    All other components within normal limits    Baseline Polysomnogram 10/09/17: IMPRESSION:  1. Obstructive Sleep Apnea (OSA)  2. Dysfunctions associated with sleep stages or arousal from  sleep  RECOMMENDATIONS:  This study demonstrates mild/borderline obstructive sleep  apnea with a total AHI of 5.8/hour and O2 nadir of 87%. Of note,  the absence of supine sleep and REM sleep during this study  likely underestimates her AHI and O2 nadir. Due to the overall  mild nature of the patient's sleep apnea, there are several  therapeutic avenues available...   EKG:  06/29/20: NSR   CV: LLE Venous US 04/23/20: Summary:  LEFT:  - There is no evidence of deep vein thrombosis in the lower extremity.  However, portions of this examination were limited- see technologist  comments above.  - No cystic structure found in the popliteal fossa.    Cardiac cath 02/21/19 [facility] CE): Conclusions  Diagnostic Procedure Summary  Angiographycally normal coronary arteries  Elevated LVEDP  Continue medical management   Event monitor 02/06/19 [facility]): Baseline  Rhythm: sinus rhythm  Rhythm Findings:  1. Ventricular: no  2. Supraventricular: no  3. Bradyarrhythmias and Pauses: no, baseline sample showed a heart rate of 90 bpm sinus rhythm  Symptoms reported: chest pains, palpitations, heart flutter and skipped beat patient had a total of 10 stable events none of which were associated with any arrhythmia.  CONCLUSIONS:  1. normal event monitor.  2. arrhythmia:not present  3. Symptoms were reported  4. Symptoms were not correlated with arrhythmias    Nuclear stress test 01/08/19 [facility] CE): LV size:Normal  Scintigraphic Findings  There is a reversible anterior wall defect noted which is compatible with  ischemia.  The calculated EF is 63%.  There is gut uptake noted on rest images.    Echo 01/08/19 [facility] CE): Conclusions  Summary  CPS of 08/25/11 there are changes  Non-specific mitral leaflet thickening with preserved mobility.  Mild mitral regurgitation.  Ejection fraction is visually estimated at 40-45%.  Moderate global LV hypokinesis.  Grade I diastolic dysfunction.    Past Medical History:  Diagnosis Date  . Anemia   . Arthritis    per patient, in left and right knee and both hands  . Atrial fibrillation ([place])   . Bilateral breast cysts   . CHF (congestive heart failure) ([place])   . Chronic kidney disease    stage 3  . Depression with anxiety   . DM (diabetes mellitus) ([place])   . Esophageal reflux   . History of hiatal hernia   . HTN (hypertension)   . Hyperlipemia   . Hyperthyroidism   . Hypothyroidism   . INR (international normal ratio) abnormal   . MRSA (methicillin resistant staph aureus) culture positive   . Panuveitis   . Pneumonia   . PTSD (post-traumatic stress disorder)   . Sleep apnea    per patient has CPAP, wears it at night sometimes    Past Surgical History:  Procedure Laterality Date  . ABDOMINAL HYSTERECTOMY    . BREAST BIOPSY    . CATARACT EXTRACTION  2018  . CESAREAN SECTION    .  DENTAL SURGERY    . EYE SURGERY    . FOOT SURGERY    . HERNIA REPAIR    . KNEE SURGERY    . TONSILLECTOMY AND ADENOIDECTOMY    . TOTAL KNEE ARTHROPLASTY Left 04/21/2020   Procedure: LEFT TOTAL KNEE ARTHROPLASTY;  Surgeon: Jennifer Killings, MD;  Location: [facility];  Service: Orthopedics;  Laterality: Left;    MEDICATIONS: . Adalimumab 40 MG/0.8ML PNKT  . azaTHIOprine (IMURAN) 50 MG tablet  . budesonide-formoterol (SYMBICORT) 160-4.5 MCG/ACT inhaler  . buPROPion (WELLBUTRIN XL) 150 MG 24 hr tablet  . busPIRone (BUSPAR) 10 MG tablet  . ELIQUIS 2.5 MG TABS tablet  . fluticasone (FLONASE) 50 MCG/ACT nasal spray  . furosemide (LASIX) 40 MG tablet  . HYDROcodone-acetaminophen (NORCO/VICODIN) 5-325 MG tablet  . ipratropium (ATROVENT) 0.03 % nasal spray  . levalbuterol (XOPENEX HFA) 45 MCG/ACT inhaler  . levothyroxine (SYNTHROID, LEVOTHROID) 50 MCG  tablet  . methocarbamol (ROBAXIN) 500 MG tablet  . metoprolol succinate (TOPROL-XL) 25 MG 24 hr tablet  . omeprazole (PRILOSEC) 20 MG capsule  . potassium chloride (K-DUR,KLOR-CON) 10 MEQ tablet  . prednisoLONE acetate (PRED FORTE) 1 % ophthalmic suspension  . rosuvastatin (CRESTOR) 5 MG tablet   No current facility-administered medications for this encounter.    Myra Gianotti, PA-C Surgical Short Stay/Anesthesiology [facility] Phone [phone] [facility] Phone [phone] 07/13/2020 5:11 PM

## 2020-07-13 NOTE — Telephone Encounter (Signed)
Patient picked up her script for the stockings from our office when she came to physical therapy yesterday.  She said she was told by someone at [facility] on [address] the stockings are not available now, however they have something similar but would require her to come in and be measured. She is asking if there is another place that may have them.  She must arrange transportation to any appointments.  She has a pre-admissions appointment today at 1pm and plans to be there.  Patient missed her Covid test this morning and stated she is unable to get in her MY CHART.  I offered to make another Covid test appointment today or tomorrow  but she has declined stating she has no transportation and must let them know 3 days in advance.    Dr. Zenia Resides office has faxed over the clearance for surgery letter and instructing patient to stop Eliquis 2 days prior to surgery and restart as appropriate.  Copy of this clearance has been faxed to [facility] short stay.

## 2020-07-13 NOTE — Anesthesia Preprocedure Evaluation (Addendum)
Anesthesia Evaluation  Patient identified by MRN, date of birth, ID band Patient awake    Reviewed: Allergy & Precautions, NPO status , Patient's Chart, lab work & pertinent test results  Airway Mallampati: I       Dental  (+) Edentulous Upper, Edentulous Lower   Pulmonary sleep apnea and Continuous Positive Airway Pressure Ventilation , former smoker,    Pulmonary exam normal        Cardiovascular hypertension, Pt. on home beta blockers Normal cardiovascular exam+ dysrhythmias Atrial Fibrillation      Neuro/Psych PSYCHIATRIC DISORDERS Anxiety Depression    GI/Hepatic GERD  Medicated and Controlled,  Endo/Other  diabetesHypothyroidism Morbid obesity  Renal/GU Renal InsufficiencyRenal disease CKD III     negative genitourinary   Musculoskeletal   Abdominal (+) + obese,   Peds  Hematology   Anesthesia Other Findings   Reproductive/Obstetrics                            Anesthesia Physical Anesthesia Plan  ASA: III  Anesthesia Plan: General   Post-op Pain Management:    Induction: Intravenous  PONV Risk Score and Plan: 4 or greater and Ondansetron and Midazolam  Airway Management Planned: Oral ETT  Additional Equipment: None  Intra-op Plan:   Post-operative Plan: Extubation in OR  Informed Consent: I have reviewed the patients History and Physical, chart, labs and discussed the procedure including the risks, benefits and alternatives for the proposed anesthesia with the patient or authorized representative who has indicated his/her understanding and acceptance.     Dental advisory given  Plan Discussed with: CRNA  Anesthesia Plan Comments: (PAT note written 07/13/2020 by Myra Gianotti, PA-C. )      Anesthesia Quick Evaluation                                  Anesthesia Evaluation  Patient identified by MRN, date of birth, ID band Patient awake    Reviewed: Allergy &  Precautions, NPO status , Patient's Chart, lab work & pertinent test results, reviewed documented beta blocker date and time   Airway Mallampati: I  TM Distance: >3 FB Neck ROM: Full    Dental no notable dental hx. (+) Dental Advisory Given, Edentulous Upper, Edentulous Lower   Pulmonary sleep apnea and Continuous Positive Airway Pressure Ventilation , COPD,  COPD inhaler, former smoker,  Quit smoking 2005, 9 pack year history, currently using smokeless tobacco- has never used rescue inhaler  Mild/borderline OSA on sleep study 2018- Not always compliant with CPAP   Pulmonary exam normal breath sounds clear to auscultation       Cardiovascular hypertension, Pt. on medications and Pt. on home beta blockers +CHF  Normal cardiovascular exam+ dysrhythmias Atrial Fibrillation + Valvular Problems/Murmurs (mild MR) MR  Rhythm:Regular Rate:Normal  Cardiac cath 02/21/19 (for false + NST, [facility] CE): Angiographycally normal coronary arteries on cath Elevated LVEDP  Continue medical management  Event monitor 02/06/19 [facility] CE): CONCLUSIONS:  1. normal event monitor.  2. Symptoms were not correlated with arrhythmias   Echo 01/08/19: Summary  CPS of 08/25/11 there are changes  Non-specific mitral leaflet thickening with preserved mobility.  Mild mitral regurgitation.  Ejection fraction is visually estimated at 40-45%.  Moderate global LV hypokinesis.  Grade I diastolic dysfunction.    PAF- on xarelto- last dose: 6/27   Neuro/Psych PSYCHIATRIC DISORDERS Anxiety Depression PTSDnegative neurological ROS  GI/Hepatic Neg liver ROS, hiatal hernia, GERD  Medicated and Controlled,  Endo/Other  diabetes, Well Controlled, Type 2Hypothyroidism Morbid obesityBMI 40 T2DM- no meds  Renal/GU Renal InsufficiencyRenal diseaseCr 1.55, CKD 3  negative genitourinary   Musculoskeletal  (+) Arthritis , Osteoarthritis,  L knee OA   Abdominal (+) + obese,   Peds   Hematology negative hematology ROS (+) hct 38.6, plt 236   Anesthesia Other Findings   Reproductive/Obstetrics negative OB ROS S/p hysterectomy                         Anesthesia Physical Anesthesia Plan  ASA: III  Anesthesia Plan: Spinal, MAC and Regional   Post-op Pain Management:  Regional for Post-op pain   Induction:   PONV Risk Score and Plan: 2 and Propofol infusion, TIVA and Treatment may vary due to age or medical condition  Airway Management Planned: Natural Airway and Nasal Cannula  Additional Equipment: None  Intra-op Plan:   Post-operative Plan:   Informed Consent: I have reviewed the patients History and Physical, chart, labs and discussed the procedure including the risks, benefits and alternatives for the proposed anesthesia with the patient or authorized representative who has indicated his/her understanding and acceptance.       Plan Discussed with: CRNA  Anesthesia Plan Comments:       Anesthesia Quick Evaluation

## 2020-07-13 NOTE — Progress Notes (Signed)
PCP - Dr. Precious Haws Cardiologist - Dr. Terrence Dupont  Chest x-ray - n/a EKG - 06/29/20 Stress Test -01/08/19  ECHO - 2012 Cardiac Cath -02/21/19   Sleep Study - OSA+ CPAP - uses CPAP but inconsistently   Does not check CBG at home nor has the materials to check CBG at home.  CBG at PAT-148 Will obtain A1C today.   Blood Thinner Instructions: Eliquis; instructed by Dr. Terrence Dupont to hold 2 days prior to surgery. LD 07/13/20.  Aspirin Instructions:n/a  ERAS Protcol -Pt instructed to stop clears by 0630 on DOS.  PRE-SURGERY Ensure or G2- Pt provided with (1) Gatorade G2.  COVID TEST- today, 07/13/20  Anesthesia review: yes, cardiac clearance in shadow chart.  Patient denies shortness of breath, fever, cough and chest pain at PAT appointment   All instructions explained to the patient, with a verbal understanding of the material. Patient agrees to go over the instructions while at home for a better understanding. Patient also instructed to self quarantine after being tested for COVID-19. The opportunity to ask questions was provided.   Coronavirus Screening  Have you experienced the following symptoms:  Cough yes/no: No Fever (>100.34F)  yes/no: No Runny nose yes/no: No Sore throat yes/no: No Difficulty breathing/shortness of breath  yes/no: No  Have you or a family member traveled in the last 14 days and where? yes/no: No   If the patient indicates "YES" to the above questions, their PAT will be rescheduled to limit the exposure to others and, the surgeon will be notified. THE PATIENT WILL NEED TO BE ASYMPTOMATIC FOR 14 DAYS.   If the patient is not experiencing any of these symptoms, the PAT nurse will instruct them to NOT bring anyone with them to their appointment since they may have these symptoms or traveled as well.   Please remind your patients and families that hospital visitation restrictions are in effect and the importance of the restrictions.

## 2020-07-13 NOTE — Telephone Encounter (Signed)
I am not aware of anywhere patient can get thigh high TEDS without going to pick up. Is this something that can be provided to her post op in recovery?

## 2020-07-14 ENCOUNTER — Encounter: Payer: Medicare Other | Admitting: Physical Therapy

## 2020-07-14 NOTE — Telephone Encounter (Signed)
noted 

## 2020-07-14 NOTE — Telephone Encounter (Signed)
I called patient and advised. 

## 2020-07-16 ENCOUNTER — Ambulatory Visit (HOSPITAL_COMMUNITY): Payer: Medicare Other | Admitting: Vascular Surgery

## 2020-07-16 ENCOUNTER — Other Ambulatory Visit: Payer: Self-pay

## 2020-07-16 ENCOUNTER — Ambulatory Visit (INDEPENDENT_AMBULATORY_CARE_PROVIDER_SITE_OTHER): Payer: Medicare Other | Admitting: Rehabilitative and Restorative Service Providers"

## 2020-07-16 ENCOUNTER — Ambulatory Visit (HOSPITAL_COMMUNITY)
Admission: RE | Admit: 2020-07-16 | Discharge: 2020-07-16 | Disposition: A | Discharge status: Home / Self Care | Payer: Medicare Other | Attending: Orthopaedic Surgery | Admitting: Orthopaedic Surgery

## 2020-07-16 ENCOUNTER — Encounter (HOSPITAL_COMMUNITY)
Admission: RE | Disposition: A | Discharge status: Home / Self Care | Payer: Self-pay | Source: Home / Self Care | Attending: Orthopaedic Surgery

## 2020-07-16 ENCOUNTER — Encounter (HOSPITAL_COMMUNITY): Payer: Self-pay | Admitting: Orthopaedic Surgery

## 2020-07-16 ENCOUNTER — Encounter: Payer: Self-pay | Admitting: Rehabilitative and Restorative Service Providers"

## 2020-07-16 DIAGNOSIS — Z87891 Personal history of nicotine dependence: Secondary | ICD-10-CM | POA: Insufficient documentation

## 2020-07-16 DIAGNOSIS — N183 Chronic kidney disease, stage 3 unspecified: Secondary | ICD-10-CM | POA: Diagnosis not present

## 2020-07-16 DIAGNOSIS — M25662 Stiffness of left knee, not elsewhere classified: Secondary | ICD-10-CM

## 2020-07-16 DIAGNOSIS — Z6839 Body mass index (BMI) 39.0-39.9, adult: Secondary | ICD-10-CM | POA: Insufficient documentation

## 2020-07-16 DIAGNOSIS — Z96652 Presence of left artificial knee joint: Secondary | ICD-10-CM | POA: Diagnosis not present

## 2020-07-16 DIAGNOSIS — G4733 Obstructive sleep apnea (adult) (pediatric): Secondary | ICD-10-CM | POA: Diagnosis not present

## 2020-07-16 DIAGNOSIS — R262 Difficulty in walking, not elsewhere classified: Secondary | ICD-10-CM | POA: Diagnosis not present

## 2020-07-16 DIAGNOSIS — Z9119 Patient's noncompliance with other medical treatment and regimen: Secondary | ICD-10-CM | POA: Insufficient documentation

## 2020-07-16 DIAGNOSIS — I509 Heart failure, unspecified: Secondary | ICD-10-CM | POA: Diagnosis not present

## 2020-07-16 DIAGNOSIS — M24662 Ankylosis, left knee: Secondary | ICD-10-CM | POA: Insufficient documentation

## 2020-07-16 DIAGNOSIS — I130 Hypertensive heart and chronic kidney disease with heart failure and stage 1 through stage 4 chronic kidney disease, or unspecified chronic kidney disease: Secondary | ICD-10-CM | POA: Insufficient documentation

## 2020-07-16 DIAGNOSIS — M25562 Pain in left knee: Secondary | ICD-10-CM

## 2020-07-16 DIAGNOSIS — R600 Localized edema: Secondary | ICD-10-CM

## 2020-07-16 DIAGNOSIS — E1122 Type 2 diabetes mellitus with diabetic chronic kidney disease: Secondary | ICD-10-CM | POA: Diagnosis not present

## 2020-07-16 HISTORY — PX: KNEE CLOSED REDUCTION: SHX995

## 2020-07-16 HISTORY — PX: KNEE ARTHROSCOPY: SHX127

## 2020-07-16 LAB — GLUCOSE, CAPILLARY
Glucose-Capillary: 120 mg/dL — ABNORMAL HIGH (ref 70–99)
Glucose-Capillary: 112 mg/dL — ABNORMAL HIGH (ref 70–99)

## 2020-07-16 SURGERY — MANIPULATION, KNEE, CLOSED
Anesthesia: General | Site: Knee | Laterality: Left

## 2020-07-16 MED ORDER — LACTATED RINGERS IV SOLN: INTRAVENOUS | Status: DC

## 2020-07-16 MED ORDER — BUPIVACAINE HCL (PF) 0.25 % IJ SOLN
INTRAMUSCULAR | Status: AC
  Filled 2020-07-16: qty 30

## 2020-07-16 MED ORDER — SUCCINYLCHOLINE CHLORIDE 200 MG/10ML IV SOSY
PREFILLED_SYRINGE | INTRAVENOUS | Status: AC
  Filled 2020-07-16: qty 10

## 2020-07-16 MED ORDER — CHLORHEXIDINE GLUCONATE 0.12 % MT SOLN
15.0000 mL | Freq: Once | OROMUCOSAL | Status: AC
  Administered 2020-07-16: 15 mL via OROMUCOSAL
  Filled 2020-07-16: qty 15

## 2020-07-16 MED ORDER — LIDOCAINE 2% (20 MG/ML) 5 ML SYRINGE
INTRAMUSCULAR | Status: AC
  Filled 2020-07-16: qty 5

## 2020-07-16 MED ORDER — ACETAMINOPHEN 160 MG/5ML PO SOLN: 325.0000 mg | ORAL | Status: DC | PRN

## 2020-07-16 MED ORDER — FENTANYL CITRATE (PF) 250 MCG/5ML IJ SOLN
INTRAMUSCULAR | Status: AC
Start: 2020-07-16 — End: ?
  Filled 2020-07-16: qty 5

## 2020-07-16 MED ORDER — PROPOFOL 10 MG/ML IV BOLUS
INTRAVENOUS | Status: DC | PRN
  Administered 2020-07-16: 140 mg via INTRAVENOUS

## 2020-07-16 MED ORDER — ONDANSETRON HCL 4 MG/2ML IJ SOLN: 4.0000 mg | Freq: Once | INTRAMUSCULAR | Status: DC | PRN

## 2020-07-16 MED ORDER — ROCURONIUM BROMIDE 10 MG/ML (PF) SYRINGE
PREFILLED_SYRINGE | INTRAVENOUS | Status: DC | PRN
  Administered 2020-07-16 (×2): 20 mg via INTRAVENOUS

## 2020-07-16 MED ORDER — MEPERIDINE HCL 25 MG/ML IJ SOLN: 6.2500 mg | INTRAMUSCULAR | Status: DC | PRN

## 2020-07-16 MED ORDER — SODIUM CHLORIDE 0.9 % IR SOLN
Status: DC | PRN
  Administered 2020-07-16: 3000 mL

## 2020-07-16 MED ORDER — BUPIVACAINE HCL (PF) 0.25 % IJ SOLN
INTRAMUSCULAR | Status: DC | PRN
  Administered 2020-07-16: 20 mL

## 2020-07-16 MED ORDER — KETOROLAC TROMETHAMINE 30 MG/ML IJ SOLN: 30.0000 mg | Freq: Once | INTRAMUSCULAR | Status: DC | PRN

## 2020-07-16 MED ORDER — SUCCINYLCHOLINE CHLORIDE 200 MG/10ML IV SOSY
PREFILLED_SYRINGE | INTRAVENOUS | Status: DC | PRN
  Administered 2020-07-16: 180 mg via INTRAVENOUS

## 2020-07-16 MED ORDER — FENTANYL CITRATE (PF) 250 MCG/5ML IJ SOLN
INTRAMUSCULAR | Status: DC | PRN
Start: 2020-07-16 — End: 2020-07-16
  Administered 2020-07-16: 100 ug via INTRAVENOUS

## 2020-07-16 MED ORDER — ORAL CARE MOUTH RINSE: 15.0000 mL | Freq: Once | OROMUCOSAL | Status: AC

## 2020-07-16 MED ORDER — ROCURONIUM BROMIDE 10 MG/ML (PF) SYRINGE
PREFILLED_SYRINGE | INTRAVENOUS | Status: AC
  Filled 2020-07-16: qty 10

## 2020-07-16 MED ORDER — PROPOFOL 10 MG/ML IV BOLUS
INTRAVENOUS | Status: AC
  Filled 2020-07-16: qty 20

## 2020-07-16 MED ORDER — LIDOCAINE 2% (20 MG/ML) 5 ML SYRINGE
INTRAMUSCULAR | Status: DC | PRN
  Administered 2020-07-16: 100 mg via INTRAVENOUS

## 2020-07-16 MED ORDER — FENTANYL CITRATE (PF) 100 MCG/2ML IJ SOLN
INTRAMUSCULAR | Status: AC
  Filled 2020-07-16: qty 2

## 2020-07-16 MED ORDER — ACETAMINOPHEN 10 MG/ML IV SOLN
INTRAVENOUS | Status: DC | PRN
  Administered 2020-07-16: 1000 mg via INTRAVENOUS

## 2020-07-16 MED ORDER — ACETAMINOPHEN 325 MG PO TABS: 325.0000 mg | ORAL_TABLET | ORAL | Status: DC | PRN

## 2020-07-16 MED ORDER — ACETAMINOPHEN 10 MG/ML IV SOLN
INTRAVENOUS | Status: AC
  Filled 2020-07-16: qty 100

## 2020-07-16 MED ORDER — SUGAMMADEX SODIUM 200 MG/2ML IV SOLN
INTRAVENOUS | Status: DC | PRN
  Administered 2020-07-16: 100 mg via INTRAVENOUS

## 2020-07-16 MED ORDER — MIDAZOLAM HCL 2 MG/2ML IJ SOLN
INTRAMUSCULAR | Status: DC | PRN
  Administered 2020-07-16: 2 mg via INTRAVENOUS

## 2020-07-16 MED ORDER — OXYCODONE-ACETAMINOPHEN 5-325 MG PO TABS: 1.0000 | ORAL_TABLET | ORAL | 0 refills | Status: DC | PRN

## 2020-07-16 MED ORDER — FENTANYL CITRATE (PF) 100 MCG/2ML IJ SOLN
25.0000 ug | INTRAMUSCULAR | Status: DC | PRN
  Administered 2020-07-16 (×2): 25 ug via INTRAVENOUS

## 2020-07-16 MED ORDER — ONDANSETRON HCL 4 MG/2ML IJ SOLN
INTRAMUSCULAR | Status: AC
  Filled 2020-07-16: qty 2

## 2020-07-16 MED ORDER — ONDANSETRON HCL 4 MG/2ML IJ SOLN
INTRAMUSCULAR | Status: DC | PRN
  Administered 2020-07-16: 4 mg via INTRAVENOUS

## 2020-07-16 MED ORDER — MIDAZOLAM HCL 2 MG/2ML IJ SOLN
INTRAMUSCULAR | Status: AC
  Filled 2020-07-16: qty 2

## 2020-07-16 SURGICAL SUPPLY — 40 items
APL PRP STRL LF DISP 70% ISPRP (MISCELLANEOUS) ×1
APL SKNCLS STERI-STRIP NONHPOA (GAUZE/BANDAGES/DRESSINGS) ×1
BENZOIN TINCTURE PRP APPL 2/3 (GAUZE/BANDAGES/DRESSINGS) ×2 IMPLANT
BLADE CUDA 5.5 (BLADE) IMPLANT
BLADE EXCALIBUR 4.0X13 (MISCELLANEOUS) ×2 IMPLANT
BNDG ELASTIC 6X5.8 VLCR STR LF (GAUZE/BANDAGES/DRESSINGS) ×4 IMPLANT
BOOTCOVER CLEANROOM LRG (PROTECTIVE WEAR) ×2 IMPLANT
BUR OVAL 6.0 (BURR) IMPLANT
CHLORAPREP W/TINT 26 (MISCELLANEOUS) ×2 IMPLANT
COVER SURGICAL LIGHT HANDLE (MISCELLANEOUS) ×2 IMPLANT
COVER WAND RF STERILE (DRAPES) IMPLANT
CUFF TOURN SGL QUICK 34 (TOURNIQUET CUFF)
CUFF TOURN SGL QUICK 42 (TOURNIQUET CUFF) IMPLANT
CUFF TRNQT CYL 34X4.125X (TOURNIQUET CUFF) IMPLANT
DRAPE ARTHROSCOPY W/POUCH 114 (DRAPES) ×2 IMPLANT
DRSG PAD ABDOMINAL 8X10 ST (GAUZE/BANDAGES/DRESSINGS) ×2 IMPLANT
DRSG XEROFORM 1X8 (GAUZE/BANDAGES/DRESSINGS) IMPLANT
DURAPREP 26ML APPLICATOR (WOUND CARE) IMPLANT
GAUZE SPONGE 4X4 12PLY STRL (GAUZE/BANDAGES/DRESSINGS) ×2 IMPLANT
GAUZE XEROFORM 1X8 LF (GAUZE/BANDAGES/DRESSINGS) ×2 IMPLANT
GLOVE BIOGEL PI IND STRL 8 (GLOVE) ×1 IMPLANT
GLOVE BIOGEL PI INDICATOR 8 (GLOVE) ×1
GLOVE ORTHO TXT STRL SZ7.5 (GLOVE) ×2 IMPLANT
GOWN STRL REUS W/ TWL LRG LVL3 (GOWN DISPOSABLE) ×2 IMPLANT
GOWN STRL REUS W/TWL 2XL LVL3 (GOWN DISPOSABLE) IMPLANT
GOWN STRL REUS W/TWL LRG LVL3 (GOWN DISPOSABLE) ×4
KIT BASIN OR (CUSTOM PROCEDURE TRAY) ×2 IMPLANT
KIT TURNOVER KIT B (KITS) ×2 IMPLANT
NEEDLE HYPO 25GX1X1/2 BEV (NEEDLE) IMPLANT
PACK ARTHROSCOPY DSU (CUSTOM PROCEDURE TRAY) ×2 IMPLANT
PAD ABD 8X10 STRL (GAUZE/BANDAGES/DRESSINGS) IMPLANT
PAD ARMBOARD 7.5X6 YLW CONV (MISCELLANEOUS) ×4 IMPLANT
PAD CAST 4YDX4 CTTN HI CHSV (CAST SUPPLIES) ×1 IMPLANT
PADDING CAST COTTON 4X4 STRL (CAST SUPPLIES) ×2
PADDING CAST COTTON 6X4 STRL (CAST SUPPLIES) ×2 IMPLANT
SPONGE LAP 4X18 RFD (DISPOSABLE) ×2 IMPLANT
STRIP CLOSURE SKIN 1/2X4 (GAUZE/BANDAGES/DRESSINGS) IMPLANT
SUT ETHILON 3 0 PS 1 (SUTURE) ×2 IMPLANT
TOWEL GREEN STERILE FF (TOWEL DISPOSABLE) ×4 IMPLANT
TUBING ARTHROSCOPY IRRIG 16FT (MISCELLANEOUS) ×2 IMPLANT

## 2020-07-16 NOTE — Therapy (Signed)
[place] [place], [place], [address] Phone: [phone]   Fax:  [phone]  Physical Therapy Treatment/Reassessment  Patient Details  Name: Jennifer Foley MRN: [id number] Date of Birth: 12-31-1955 Referring Provider (PT): Rodell Perna, MD   Encounter Date: 07/16/2020   PT End of Session - 07/16/20 1644    Visit Number 7    Number of Visits 16    Date for PT Re-Evaluation 08/05/20    Progress Note Due on Visit 10    PT Start Time 1430    PT Stop Time 1518    PT Time Calculation (min) 48 min    Activity Tolerance Patient tolerated treatment well;Patient limited by pain    Behavior During Therapy [facility] for tasks assessed/performed           Past Medical History:  Diagnosis Date   Anemia    Arthritis    per patient, in left and right knee and both hands   Atrial fibrillation (HCC)    Bilateral breast cysts    CHF (congestive heart failure) (HCC)    Chronic kidney disease    stage 3   Depression with anxiety    DM (diabetes mellitus) ([place])    Esophageal reflux    History of hiatal hernia    HTN (hypertension)    Hyperlipemia    Hyperthyroidism    Hypothyroidism    INR (international normal ratio) abnormal    MRSA (methicillin resistant staph aureus) culture positive    Panuveitis    Pneumonia    PTSD (post-traumatic stress disorder)    Sleep apnea    per patient has CPAP, wears it at night sometimes    Past Surgical History:  Procedure Laterality Date   ABDOMINAL HYSTERECTOMY     BREAST BIOPSY     CATARACT EXTRACTION  2018   [place] Left 04/21/2020   Procedure: LEFT TOTAL KNEE ARTHROPLASTY;  Surgeon: [name], MD;  Location: [facility];  Service: Orthopedics;  Laterality: Left;    There were no vitals filed for  this visit.   Subjective Assessment - 07/16/20 1504    Subjective Jennifer Foley had an arthroscopic manipulation of the L knee this morning.    Limitations Walking;Standing;House hold activities    Diagnostic tests X-ray    Patient Stated Goals walk without a cane    Currently in Pain? Yes    Pain Score 7     Pain Location Knee    Pain Orientation Left    Pain Descriptors / Indicators Aching;Shooting;Sore;Tightness    Pain Type Surgical pain    Pain Onset Today    Pain Frequency Constant    Aggravating Factors  Movement    Pain Relieving Factors Meds and ice    Effect of Pain on Daily Activities Limits WB function, must use a walker    Multiple Pain Sites No                             [facility] Adult PT Treatment/Exercise - 07/16/20 0001      Self-Care   Self-Care ADL's    Other Self-Care Comments  Removed bandage, educated Jennifer Foley on the importance of frequent early AROM  and keeping the scope sites clean.      Exercises   Exercises Knee/Hip      Knee/Hip Exercises: Stretches   Other Knee/Hip Stretches Seated tailgate knee flexion AROM 3X 3 minutes      Knee/Hip Exercises: Aerobic   Stationary Bike For AROM (back and forth) 25 minutes                  PT Education - 07/16/20 1642    Education Details Discussed the importance of keeping the scope sights clean.  Emphasized the importance of early flexion AROM to maintain gains achieved with today's scope (although pain/guarding limited active flexion to 45 degrees today pre-treatment).    Person(s) Educated Patient    Methods Explanation;Demonstration;Tactile cues;Verbal cues;Handout    Comprehension Verbalized understanding;Tactile cues required;Need further instruction;Returned demonstration;Verbal cues required               PT Long Term Goals - 07/16/20 1644      PT LONG TERM GOAL #1   Title Patient will be independent in her HEP and progression.    Baseline inital program issued on 06/10/2020     Time 8    Period Weeks    Status On-going      PT LONG TERM GOAL #2   Title Patient able to complete 5 time sit to stand </= 18 seconds to improve balance and functional mobility.    Baseline 25.3 seconds using UE support    Time 8    Period Weeks    Status On-going      PT LONG TERM GOAL #3   Title Patient will be able to amb with straigth cane for >/= 500 feet on community level surfaces with step through gait pattern.    Baseline currently using RW walking on househod distances    Time 8    Period Weeks    Status On-going      PT LONG TERM GOAL #4   Title Pt will improve her L knee flexion to >/= 90 degrees go improve funtional mobility and gait.    Baseline 50 degrees actively    Time 8    Period Weeks    Status On-going                 Plan - 07/16/20 1513    Clinical Impression Statement Jennifer Foley had an arthroscopic manipulation earlier today.  Guarding limits AROM for flexion to 45 degrees today.  We spent time reviewing correct wound care and the importance of 5X/daily HEP (focus on flexion AROM) to maximize results from today's surgical manipulation.  Flexion AROM remains the emphasis and Jennifer Foley is encouraged to attend PT on a daily basis for the next week.    Personal Factors and Comorbidities Comorbidity 3+    Comorbidities L TKA 04/21/2020, a-fib, arthritis, CHF, CKD, DM, depression, HTN, sleep apnea, PTSD, hyperthyroidism    Examination-Activity Limitations Squat;Sit;Sleep;Stairs;Stand;Lift;Transfers    Examination-Participation Restrictions Other;Church;Shop;Community Activity;Driving    Stability/Clinical Decision Making Stable/Uncomplicated    Rehab Potential Fair    PT Frequency 5x / week    PT Duration 8 weeks    PT Treatment/Interventions Cryotherapy;Electrical Stimulation;ADLs/Self Care Home Management;Ultrasound;Gait training;Stair training;Functional mobility training;Therapeutic activities;Therapeutic exercise;Moist Heat;Balance  training;Neuromuscular re-education;Patient/family education;Manual techniques;Scar mobilization;Dry needling;Taping;Vasopneumatic Device;Passive range of motion    PT Next Visit Plan will likely need recert due to MUA - measure, aggressive ROM    PT Home Exercise Plan [email or web address], Access Code: [email or web address]    Consulted and Agree with  Plan of Care Patient           Patient will benefit from skilled therapeutic intervention in order to improve the following deficits and impairments:  Pain, Postural dysfunction, Decreased activity tolerance, Decreased strength, Impaired flexibility, Obesity, Decreased range of motion, Increased edema, Difficulty walking, Decreased balance  Visit Diagnosis: Acute pain of left knee  Stiffness of left knee, not elsewhere classified  Difficulty in walking, not elsewhere classified  Localized edema     Problem List Patient Active Problem List   Diagnosis Date Noted   Arthrofibrosis of knee joint, left 06/24/2020   S/P total knee arthroplasty, left 06/02/2020   Noncompliance with CPAP treatment 09/24/2018   A-fib ([place]) 08/31/2018   CHF (congestive heart failure) ([place]) 08/31/2018   Acute pyelonephritis 08/31/2018   Sepsis secondary to UTI ([place]) 08/31/2018   Leucocytosis 08/31/2018   Hypothyroidism 08/31/2018   CKD (chronic kidney disease) stage 3, GFR 30-59 ml/min 08/31/2018   HTN (hypertension) 08/31/2018   HLD (hyperlipidemia) 08/31/2018   Anxiety and depression 08/31/2018   Obesity 08/31/2018   Sepsis ([place]) 08/31/2018   Back pain 08/31/2018   Obstructive sleep apnea treated with continuous positive airway pressure (CPAP) 08/13/2018    Farley Ly PT, MPT 07/16/2020, 4:49 PM  [facility] [address] [place], [place], [address] Phone: [phone]   Fax:  [phone]  Name: Jennifer Foley MRN: [id number] Date of Birth: [birth year]

## 2020-07-16 NOTE — Anesthesia Procedure Notes (Signed)
Procedure Name: Intubation Date/Time: 07/16/2020 9:31 AM Performed by: Michele Rockers, CRNA Pre-anesthesia Checklist: Patient identified, Emergency Drugs available, Suction available and Patient being monitored Patient Re-evaluated:Patient Re-evaluated prior to induction Oxygen Delivery Method: Circle system utilized Preoxygenation: Pre-oxygenation with 100% oxygen Induction Type: IV induction Ventilation: Mask ventilation without difficulty Laryngoscope Size: Miller and 2 Grade View: Grade I Tube type: Oral Tube size: 7.0 mm Number of attempts: 1 Airway Equipment and Method: Stylet and Oral airway Placement Confirmation: ETT inserted through vocal cords under direct vision,  positive ETCO2 and breath sounds checked- equal and bilateral Secured at: 21 cm Tube secured with: Tape Dental Injury: Teeth and Oropharynx as per pre-operative assessment

## 2020-07-16 NOTE — Op Note (Signed)
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  Postop diagnosis: Same  Procedure: Left knee arthroscopy arthroscopic partial synovectomy debridement of adhesions and manipulation under anesthesia.  Surgeon: Rodell Perna, MD  Assistant: Benjiman Core, PA-C present for the entire procedure.  Anesthesia: General orotracheal.  20 cc Marcaine local.  Brief history: Patient underwent left total knee arthroplasty 04/21/2020.  She obtained full extension good flexion however in the last month she has developed arthrofibrosis and can only flex to 50 degrees.  She lacks only 5 degrees reaching full extension actively and passively would come close to full extension.  Due to the length of time since her surgery which was 3 months ago arthroscopic debridement was indicated to recreate the pouch.  Procedure after induction general scheduled recommendation proximal leg holder well leg holder for the opposite leg leg was prepped with DuraPrep.  Knee would not flex more than 50 degrees with dense adhesions and despite full muscle paralysis and significant pressure further flexion was not able be obtained.  Scope was introduced with a lateral parapatellar approach and medial parapatellar approach for 4 oh shaver.  Inspection suprapatellar pouch showed that the pouch was completely adherent and filled with scar tissue.  [place] was used to recreate the past performing a synovectomy.  Medial lateral gutters had the opened up as well with scar tissue bands preventing ending entering to either medial lateral gutter once these were debrided scope was removed and the leg was able to be flexed 220 degrees.  Scope was placed above the polylooking at the dome of the poly and center post in scar tissue was debrided from around the post which allowed full extension.  Final inspection throughout the knee shows no remaining scar bands were present knee with a was able to flex under 10 degrees, full extension  easily.  Photograph of the knee flexed and extended was taken with the arthroscope for documentation given to the patient.  3-0 nylon was placed in the portals and a postop soft dressing was applied.  Patient scheduled therapy today and tomorrow as an outpatient to get started and maintain motion.

## 2020-07-16 NOTE — Interval H&P Note (Signed)
History and Physical Interval Note:  07/16/2020 8:57 AM  [name]  has presented today for surgery, with the diagnosis of left total knee arthroplasty arthrofibrosis.  The various methods of treatment have been discussed with the patient and family. After consideration of risks, benefits and other options for treatment, the patient has consented to  Procedure(s): LEFT KNEE MANIPULATION UNDER ANESTHESIA (Left) ARTHROSCOPY KNEE (Left) as a surgical intervention.  The patient's history has been reviewed, patient examined, no change in status, stable for surgery.  I have reviewed the patient's chart and labs.  Questions were answered to the patient's satisfaction.     Jennifer Foley

## 2020-07-16 NOTE — Patient Instructions (Signed)
Access Code: [email or web address]  URL: [email or web address] Date: 07/16/2020 Prepared by: Vista Mink  Exercises Seated Knee Flexion Extension AROM - 5 x daily - 7 x weekly - 1 sets - 1 reps - 3 minutes hold

## 2020-07-16 NOTE — Transfer of Care (Signed)
Immediate Anesthesia Transfer of Care Note  Patient: [name]  Procedure(s) Performed: LEFT KNEE MANIPULATION UNDER ANESTHESIA (Left Knee) ARTHROSCOPY KNEE (Left Knee)  Patient Location: PACU  Anesthesia Type:General  Level of Consciousness: drowsy and patient cooperative  Airway & Oxygen Therapy: Patient Spontanous Breathing and Patient connected to nasal cannula oxygen  Post-op Assessment: Report given to RN and Post -op Vital signs reviewed and stable  Post vital signs: Reviewed and stable  Last Vitals:  Vitals Value Taken Time  BP 134/82 07/16/20 1026  Temp 36.4 C 07/16/20 1025  Pulse 65 07/16/20 1025  Resp 14 07/16/20 1029  SpO2 97 % 07/16/20 1025  Vitals shown include unvalidated device data.  Last Pain:  Vitals:   07/16/20 1025  PainSc: 8       Patients Stated Pain Goal: 3 (46/19/01 2224)  Complications: No complications documented.

## 2020-07-16 NOTE — H&P (Signed)
Office Visit Note/H and P              Patient: Jennifer Foley                                            Date of Birth: [birth year]                                                    MRN: [id number] Visit Date: 06/23/2020                                                                     Requested by: Bartholome Bill, MD [place],  [place] [address] PCP: Bartholome Bill, MD   Assessment & Plan: Visit Diagnoses:  1. S/P total knee arthroplasty, left   2. Arthrofibrosis of knee joint, left     Plan: We will set patient up for outpatient manipulation under general anesthesia with full muscle paralysis needed to decrease risk of femur fracture.  Physical therapy will need to be scheduled the following day so we can make sure we maintain her knee flexion.  Procedure discussed.  She will need to continue therapy to prevent recurrent arthrofibrosis and maintain her range of motion.  Questions were elicited and answered she understands and agrees to proceed.  Follow-Up Instructions: No follow-ups on file.   Orders:  No orders of the defined types were placed in this encounter.  No orders of the defined types were placed in this encounter.     Procedures: No procedures performed   Clinical Data: No additional findings.   Subjective:     Chief Complaint  Patient presents with  . Left Knee - Follow-up    04/21/2020 left TKA    HPI 64 year old female returns for follow-up after left total knee arthroplasty 04/21/2020.  Original plans were going home with family.  She moves slowly in the hospital and ended up going to a skilled nursing facility.  She has had decreased range of motion in her knee and is only at 60 to 65 degrees knee flexion.  Passively the therapist has gotten her to 70 degrees.  After nursing home stay she was at home with home therapy made minimal improvement.  She continues to have pain is been taking  hydrocodone and Robaxin but is not made progress and is still has to use a walker with weak quadriceps.  Incision is well-healed no hip or groin pain.  Sensation of foot is intact and x-ray showed good position of the prosthesis.  Review of Systems 14 system update positive for hypertension sleep apnea history of hypothyroidism obesity BMI 39.  She has gained weight since her surgery of her knee.  History noncompliance with CPAP.   Objective: Vital Signs: Ht 5' (1.524 m)   Wt 204 lb (92.5 kg)   BMI 39.84 kg/m   Physical Exam Constitutional:      Appearance: She is well-developed.  HENT:  Head: Normocephalic.     Right Ear: External ear normal.     Left Ear: External ear normal.  Eyes:     Pupils: Pupils are equal, round, and reactive to light.  Neck:     Thyroid: No thyromegaly.     Trachea: No tracheal deviation.  Cardiovascular:     Rate and Rhythm: Normal rate.  Pulmonary:     Effort: Pulmonary effort is normal.  Abdominal:     Palpations: Abdomen is soft.  Skin:    General: Skin is warm and dry.  Neurological:     Mental Status: She is alert and oriented to person, place, and time.  Psychiatric:        Behavior: Behavior normal.     Ortho Exam well-healed knee incision left knee.  Negative logroll the hips distal pulses are intact negative straight leg raising.  She does reach full extension she has weak quad has 10 degree extension lag actively.  She ambulates with her walker.  Knee flexion to 60degrees.  No knee effusion no increased warmth to the knee. Specialty Comments:  No specialty comments available.  Imaging: No results found.   PMFS History:     Patient Active Problem List   Diagnosis Date Noted  . Arthrofibrosis of knee joint, left 06/24/2020  . S/P total knee arthroplasty, left 06/02/2020  . Noncompliance with CPAP treatment 09/24/2018  . A-fib ([place]) 08/31/2018  . CHF (congestive heart failure) ([place]) 08/31/2018  . Acute  pyelonephritis 08/31/2018  . Sepsis secondary to UTI ([place]) 08/31/2018  . Leucocytosis 08/31/2018  . Hypothyroidism 08/31/2018  . CKD (chronic kidney disease) stage 3, GFR 30-59 ml/min 08/31/2018  . HTN (hypertension) 08/31/2018  . HLD (hyperlipidemia) 08/31/2018  . Anxiety and depression 08/31/2018  . Obesity 08/31/2018  . Sepsis ([place]) 08/31/2018  . Back pain 08/31/2018  . Obstructive sleep apnea treated with continuous positive airway pressure (CPAP) 08/13/2018       Past Medical History:  Diagnosis Date  . Anemia   . Arthritis    per patient, in left and right knee and both hands  . Atrial fibrillation ([place])   . Bilateral breast cysts   . CHF (congestive heart failure) ([place])   . Chronic kidney disease    stage 3  . Depression with anxiety   . DM (diabetes mellitus) ([place])   . Esophageal reflux   . History of hiatal hernia   . HTN (hypertension)   . Hyperlipemia   . Hyperthyroidism   . Hypothyroidism   . INR (international normal ratio) abnormal   . MRSA (methicillin resistant staph aureus) culture positive   . Panuveitis   . Pneumonia   . PTSD (post-traumatic stress disorder)   . Sleep apnea    per patient has CPAP, wears it at night sometimes         Family History  Problem Relation Age of Onset  . Alcoholism Father   . Arthritis Father   . Hypertension Father        sister, mother  . Diabetes Mellitus I Mother        sister  . Renal Disease Mother   . Stroke Mother   . Epilepsy Brother          Past Surgical History:  Procedure Laterality Date  . ABDOMINAL HYSTERECTOMY    . BREAST BIOPSY    . CATARACT EXTRACTION  2018  . CESAREAN SECTION    . DENTAL SURGERY    . EYE SURGERY    .  FOOT SURGERY    . HERNIA REPAIR    . KNEE SURGERY    . TONSILLECTOMY AND ADENOIDECTOMY    . TOTAL KNEE ARTHROPLASTY Left 04/21/2020   Procedure: LEFT TOTAL KNEE ARTHROPLASTY;  Surgeon: [name], MD;  Location: [facility];   Service: Orthopedics;  Laterality: Left;   Social History        Occupational History  . Not on file  Tobacco Use  . Smoking status: Former Smoker    Packs/day: 0.25    Years: 34.00    Pack years: 8.50    Types: Cigarettes    Quit date: 12/15/2003    Years since quitting: 16.5  . Smokeless tobacco: Current User    Types: Snuff  . Tobacco comment: 04/15/2020: per patient 1-2 times a day, trying to stop taking it  Vaping Use  . Vaping Use: Never used  Substance and Sexual Activity  . Alcohol use: Not Currently  . Drug use: Not on file  . Sexual activity: Not on file    Discussed with patient with adhesions will add knee arthroscopy with debridement of scar tissue to help with manipulation. Risks of fracture discussed in detail.

## 2020-07-16 NOTE — Discharge Instructions (Addendum)
Ok to Remove dressing 48 hours postop, shower and apply bandaids to incisions. No tub soaking  Must begin formal PT for aggressive range of motion and strengthening as already scheduled   No driving  Weight bear as tolerated and wean off crutches as comfort allows.    Elevate foot above heart level as much as possible.

## 2020-07-16 NOTE — Anesthesia Postprocedure Evaluation (Signed)
Anesthesia Post Note  Patient: [name]  Procedure(s) Performed: LEFT KNEE MANIPULATION UNDER ANESTHESIA (Left Knee) ARTHROSCOPY KNEE (Left Knee)     Patient location during evaluation: Phase II Anesthesia Type: General Level of consciousness: awake and sedated Pain management: pain level controlled Vital Signs Assessment: post-procedure vital signs reviewed and stable Respiratory status: spontaneous breathing Cardiovascular status: stable Postop Assessment: no apparent nausea or vomiting Anesthetic complications: no   No complications documented.  Last Vitals:  Vitals:   07/16/20 1110 07/16/20 1125  BP: (!) 150/71 (!) 142/70  Pulse: 61 60  Resp: 20 18  Temp:  36.5 C  SpO2: 98% 98%    Last Pain:  Vitals:   07/16/20 1125  PainSc: 2    Pain Goal: Patients Stated Pain Goal: 2 (07/16/20 1125)  LLE Motor Response: Purposeful movement (07/16/20 1125) LLE Sensation: Full sensation (07/16/20 1125)            Huston Foley

## 2020-07-17 ENCOUNTER — Encounter (HOSPITAL_COMMUNITY): Payer: Self-pay | Admitting: Orthopaedic Surgery

## 2020-07-19 ENCOUNTER — Encounter: Payer: Self-pay | Admitting: Physical Therapy

## 2020-07-19 ENCOUNTER — Ambulatory Visit (INDEPENDENT_AMBULATORY_CARE_PROVIDER_SITE_OTHER): Payer: Medicare Other | Admitting: Physical Therapy

## 2020-07-19 ENCOUNTER — Other Ambulatory Visit: Payer: Self-pay

## 2020-07-19 DIAGNOSIS — M25662 Stiffness of left knee, not elsewhere classified: Secondary | ICD-10-CM | POA: Diagnosis not present

## 2020-07-19 DIAGNOSIS — R600 Localized edema: Secondary | ICD-10-CM | POA: Diagnosis not present

## 2020-07-19 DIAGNOSIS — R262 Difficulty in walking, not elsewhere classified: Secondary | ICD-10-CM

## 2020-07-19 DIAGNOSIS — M25562 Pain in left knee: Secondary | ICD-10-CM

## 2020-07-19 NOTE — Therapy (Signed)
[facility] [address] [place], [place], [address] Phone: [phone]   Fax:  [phone]  Physical Therapy Treatment  Patient Details  Name: Jennifer Foley MRN: [id number] Date of Birth: 11-14-1955 Referring Provider (PT): Rodell Perna, MD   Encounter Date: 07/19/2020   PT End of Session - 07/19/20 1558    Visit Number 8    Number of Visits 16    Date for PT Re-Evaluation 08/05/20    Progress Note Due on Visit 10    PT Start Time 2637   used restroom prior to session, requested to end early due to pain   PT Stop Time 1555    PT Time Calculation (min) 32 min    Activity Tolerance Patient tolerated treatment well;Patient limited by pain    Behavior During Therapy [facility] for tasks assessed/performed           Past Medical History:  Diagnosis Date  . Anemia   . Arthritis    per patient, in left and right knee and both hands  . Atrial fibrillation ([place])   . Bilateral breast cysts   . CHF (congestive heart failure) ([place])   . Chronic kidney disease    stage 3  . Depression with anxiety   . DM (diabetes mellitus) ([place])   . Esophageal reflux   . History of hiatal hernia   . HTN (hypertension)   . Hyperlipemia   . Hyperthyroidism   . Hypothyroidism   . INR (international normal ratio) abnormal   . MRSA (methicillin resistant staph aureus) culture positive   . Panuveitis   . Pneumonia   . PTSD (post-traumatic stress disorder)   . Sleep apnea    per patient has CPAP, wears it at night sometimes    Past Surgical History:  Procedure Laterality Date  . ABDOMINAL HYSTERECTOMY    . BREAST BIOPSY    . CATARACT EXTRACTION  2018  . CESAREAN SECTION    . DENTAL SURGERY    . EYE SURGERY    . FOOT SURGERY    . HERNIA REPAIR    . KNEE ARTHROSCOPY Left 07/16/2020   Procedure: ARTHROSCOPY KNEE;  Surgeon: [name], MD;  Location: [facility];  Service: Orthopedics;  Laterality: Left;  . KNEE CLOSED REDUCTION Left 07/16/2020   Procedure: LEFT  KNEE MANIPULATION UNDER ANESTHESIA;  Surgeon: [name], MD;  Location: [facility];  Service: Orthopedics;  Laterality: Left;  . KNEE SURGERY    . TONSILLECTOMY AND ADENOIDECTOMY    . TOTAL KNEE ARTHROPLASTY Left 04/21/2020   Procedure: LEFT TOTAL KNEE ARTHROPLASTY;  Surgeon: [name], MD;  Location: [facility];  Service: Orthopedics;  Laterality: Left;    There were no vitals filed for this visit.   Subjective Assessment - 07/19/20 1519    Subjective doing okay, doesn't feel like manipulation was helpful    Limitations Walking;Standing;House hold activities    Diagnostic tests X-ray    Patient Stated Goals walk without a cane    Currently in Pain? Yes    Pain Score 7     Pain Location Knee    Pain Orientation Left    Pain Descriptors / Indicators Aching;Shooting;Sore;Tightness    Pain Type Surgical pain    Pain Onset 1 to 4 weeks ago    Pain Frequency Constant    Aggravating Factors  movement    Pain Relieving Factors medication, ice  [facility] Adult PT Treatment/Exercise - 07/19/20 1520      Knee/Hip Exercises: Stretches   Knee: Self-Stretch Limitations LLE with RLE overpressure 10 x 10 sec    Other Knee/Hip Stretches tailgate flexion x 3 min      Manual Therapy   Manual therapy comments seated knee flexion with RLE LAQ 3x10; Rt foot up on step to limit hip hike with contract/relax - best ROM with this movement - close to 90 deg noted                       PT Long Term Goals - 07/16/20 1644      PT LONG TERM GOAL #1   Title Patient will be independent in her HEP and progression.    Baseline inital program issued on 06/10/2020    Time 8    Period Weeks    Status On-going      PT LONG TERM GOAL #2   Title Patient able to complete 5 time sit to stand </= 18 seconds to improve balance and functional mobility.    Baseline 25.3 seconds using UE support    Time 8    Period Weeks    Status On-going      PT LONG TERM  GOAL #3   Title Patient will be able to amb with straigth cane for >/= 500 feet on community level surfaces with step through gait pattern.    Baseline currently using RW walking on househod distances    Time 8    Period Weeks    Status On-going      PT LONG TERM GOAL #4   Title Pt will improve her L knee flexion to >/= 90 degrees go improve funtional mobility and gait.    Baseline 50 degrees actively    Time 8    Period Weeks    Status On-going                 Plan - 07/19/20 1558    Clinical Impression Statement Pt has near 90 deg PROM when she is able to relax and limits guarding, but significant pain with this PROM.  Contract/relax seems to be most beneficial at this time for PROM.  Will continue to benefit from PT to maximize function.    Personal Factors and Comorbidities Comorbidity 3+    Comorbidities L TKA 04/21/2020, a-fib, arthritis, CHF, CKD, DM, depression, HTN, sleep apnea, PTSD, hyperthyroidism    Examination-Activity Limitations Squat;Sit;Sleep;Stairs;Stand;Lift;Transfers    Examination-Participation Restrictions Other;Church;Shop;Community Activity;Driving    Stability/Clinical Decision Making Stable/Uncomplicated    Rehab Potential Fair    PT Frequency 5x / week    PT Duration 8 weeks    PT Treatment/Interventions Cryotherapy;Electrical Stimulation;ADLs/Self Care Home Management;Ultrasound;Gait training;Stair training;Functional mobility training;Therapeutic activities;Therapeutic exercise;Moist Heat;Balance training;Neuromuscular re-education;Patient/family education;Manual techniques;Scar mobilization;Dry needling;Taping;Vasopneumatic Device;Passive range of motion    PT Next Visit Plan measure, aggressive ROM, contract/relax    PT Home Exercise Plan [email or web address], Access Code: [email or web address]    Consulted and Agree with Plan of Care Patient           Patient will benefit from skilled therapeutic intervention in order to improve the following deficits and impairments:   Pain, Postural dysfunction, Decreased activity tolerance, Decreased strength, Impaired flexibility, Obesity, Decreased range of motion, Increased edema, Difficulty walking, Decreased balance  Visit Diagnosis: Acute pain of left knee  Stiffness of left knee, not elsewhere classified  Difficulty in walking, not elsewhere classified  Localized edema  Problem List Patient Active Problem List   Diagnosis Date Noted  . Arthrofibrosis of knee joint, left 06/24/2020  . S/P total knee arthroplasty, left 06/02/2020  . Noncompliance with CPAP treatment 09/24/2018  . A-fib ([place]) 08/31/2018  . CHF (congestive heart failure) ([place]) 08/31/2018  . Acute pyelonephritis 08/31/2018  . Sepsis secondary to UTI ([place]) 08/31/2018  . Leucocytosis 08/31/2018  . Hypothyroidism 08/31/2018  . CKD (chronic kidney disease) stage 3, GFR 30-59 ml/min 08/31/2018  . HTN (hypertension) 08/31/2018  . HLD (hyperlipidemia) 08/31/2018  . Anxiety and depression 08/31/2018  . Obesity 08/31/2018  . Sepsis ([place]) 08/31/2018  . Back pain 08/31/2018  . Obstructive sleep apnea treated with continuous positive airway pressure (CPAP) 08/13/2018      Laureen Abrahams, PT, DPT 07/19/20 4:01 PM     [facility] [address] [place], [place], [address] Phone: [phone]   Fax:  [phone]  Name: Jennifer Foley MRN: [id number] Date of Birth: [birth year]

## 2020-07-20 ENCOUNTER — Telehealth: Payer: Self-pay

## 2020-07-20 NOTE — Telephone Encounter (Signed)
I called she is already on the strong medication. Needs ROM then knee will quit hurting. Spent 15 minutes going over flexion work again.

## 2020-07-20 NOTE — Telephone Encounter (Signed)
Pt called and would like something else for pain. She stated the oxycodone isnt touching her pain during PT.  Please advise  Pt CB # [phone]

## 2020-07-20 NOTE — Telephone Encounter (Signed)
Pls advise.  

## 2020-07-21 ENCOUNTER — Other Ambulatory Visit: Payer: Self-pay

## 2020-07-21 ENCOUNTER — Encounter: Payer: Self-pay | Admitting: Physical Therapy

## 2020-07-21 ENCOUNTER — Ambulatory Visit (INDEPENDENT_AMBULATORY_CARE_PROVIDER_SITE_OTHER): Payer: Medicare Other | Admitting: Physical Therapy

## 2020-07-21 DIAGNOSIS — R262 Difficulty in walking, not elsewhere classified: Secondary | ICD-10-CM

## 2020-07-21 DIAGNOSIS — M25662 Stiffness of left knee, not elsewhere classified: Secondary | ICD-10-CM | POA: Diagnosis not present

## 2020-07-21 DIAGNOSIS — R600 Localized edema: Secondary | ICD-10-CM

## 2020-07-21 DIAGNOSIS — M25562 Pain in left knee: Secondary | ICD-10-CM | POA: Diagnosis not present

## 2020-07-21 NOTE — Therapy (Signed)
[facility] [address] [place], [place], [address] Phone: [phone]   Fax:  [phone]  Physical Therapy Treatment  Patient Details  Name: Jennifer Foley MRN: [id number] Date of Birth: 1956-02-25 Referring Provider (PT): Rodell Perna, MD   Encounter Date: 07/21/2020   PT End of Session - 07/21/20 1545    Visit Number 9    Number of Visits 16    Date for PT Re-Evaluation 08/05/20    Progress Note Due on Visit 10    PT Start Time 3762    PT Stop Time 1546    PT Time Calculation (min) 40 min    Activity Tolerance Patient tolerated treatment well;Patient limited by pain    Behavior During Therapy [facility] for tasks assessed/performed           Past Medical History:  Diagnosis Date   Anemia    Arthritis    per patient, in left and right knee and both hands   Atrial fibrillation (HCC)    Bilateral breast cysts    CHF (congestive heart failure) (HCC)    Chronic kidney disease    stage 3   Depression with anxiety    DM (diabetes mellitus) ([place])    Esophageal reflux    History of hiatal hernia    HTN (hypertension)    Hyperlipemia    Hyperthyroidism    Hypothyroidism    INR (international normal ratio) abnormal    MRSA (methicillin resistant staph aureus) culture positive    Panuveitis    Pneumonia    PTSD (post-traumatic stress disorder)    Sleep apnea    per patient has CPAP, wears it at night sometimes    Past Surgical History:  Procedure Laterality Date   ABDOMINAL HYSTERECTOMY     BREAST BIOPSY     CATARACT EXTRACTION  2018   CESAREAN SECTION     DENTAL SURGERY     EYE SURGERY     FOOT SURGERY     HERNIA REPAIR     KNEE ARTHROSCOPY Left 07/16/2020   Procedure: ARTHROSCOPY KNEE;  Surgeon: [name], MD;  Location: [place];  Service: Orthopedics;  Laterality: Left;   KNEE CLOSED REDUCTION Left 07/16/2020   Procedure: LEFT KNEE MANIPULATION UNDER ANESTHESIA;  Surgeon: [name], MD;   Location: [place];  Service: Orthopedics;  Laterality: Left;   KNEE SURGERY     TONSILLECTOMY AND ADENOIDECTOMY     TOTAL KNEE ARTHROPLASTY Left 04/21/2020   Procedure: LEFT TOTAL KNEE ARTHROPLASTY;  Surgeon: [name], MD;  Location: [place];  Service: Orthopedics;  Laterality: Left;    There were no vitals filed for this visit.   Subjective Assessment - 07/21/20 1510    Subjective "it's always hurting"  MD said she was already on strongest pain meds    Limitations Walking;Standing;House hold activities    Diagnostic tests X-ray    Patient Stated Goals walk without a cane    Currently in Pain? Yes    Pain Score 8     Pain Location Knee    Pain Orientation Left    Pain Descriptors / Indicators Aching;Sore;Sharp;Shooting    Pain Type Surgical pain    Pain Onset 1 to 4 weeks ago    Pain Frequency Constant    Aggravating Factors  movement    Pain Relieving Factors meds, ice              [facility] PT Assessment - 07/21/20 0001  AROM   Left Knee Flexion 68                         [facility] Adult PT Treatment/Exercise - 07/21/20 1512      Knee/Hip Exercises: Stretches   Other Knee/Hip Stretches tailgate flexion x 1 min      Knee/Hip Exercises: Aerobic   Nustep Seat 6 level 5 10 minutes with 5 second hold left knee flexion.       Manual Therapy   Manual therapy comments seated knee flexion with RLE LAQ 3x10; contract/relax - best ROM with this movement                        PT Long Term Goals - 07/16/20 1644      PT LONG TERM GOAL #1   Title Patient will be independent in her HEP and progression.    Baseline inital program issued on 06/10/2020    Time 8    Period Weeks    Status On-going      PT LONG TERM GOAL #2   Title Patient able to complete 5 time sit to stand </= 18 seconds to improve balance and functional mobility.    Baseline 25.3 seconds using UE support    Time 8    Period Weeks    Status On-going      PT LONG TERM GOAL #3    Title Patient will be able to amb with straigth cane for >/= 500 feet on community level surfaces with step through gait pattern.    Baseline currently using RW walking on househod distances    Time 8    Period Weeks    Status On-going      PT LONG TERM GOAL #4   Title Pt will improve her L knee flexion to >/= 90 degrees go improve funtional mobility and gait.    Baseline 50 degrees actively    Time 8    Period Weeks    Status On-going                 Plan - 07/21/20 1546    Clinical Impression Statement Pt with improved AROM today from last measured visit, and tolerated session better today than last visit.  Overall slowly progressing with ROM, pain continues to be limiting factor as she is very guarded.  Will continue to benefit from PT to maximize function.    Personal Factors and Comorbidities Comorbidity 3+    Comorbidities L TKA 04/21/2020, a-fib, arthritis, CHF, CKD, DM, depression, HTN, sleep apnea, PTSD, hyperthyroidism    Examination-Activity Limitations Squat;Sit;Sleep;Stairs;Stand;Lift;Transfers    Examination-Participation Restrictions Other;Church;Shop;Community Activity;Driving    Stability/Clinical Decision Making Stable/Uncomplicated    Rehab Potential Fair    PT Frequency 5x / week    PT Duration 8 weeks    PT Treatment/Interventions Cryotherapy;Electrical Stimulation;ADLs/Self Care Home Management;Ultrasound;Gait training;Stair training;Functional mobility training;Therapeutic activities;Therapeutic exercise;Moist Heat;Balance training;Neuromuscular re-education;Patient/family education;Manual techniques;Scar mobilization;Dry needling;Taping;Vasopneumatic Device;Passive range of motion    PT Next Visit Plan aggressive ROM, contract/relax    PT Home Exercise Plan [email or web address], Access Code: [email or web address]    Consulted and Agree with Plan of Care Patient           Patient will benefit from skilled therapeutic intervention in order to improve the following deficits and  impairments:  Pain, Postural dysfunction, Decreased activity tolerance, Decreased strength, Impaired flexibility, Obesity, Decreased range of motion, Increased edema, Difficulty walking, Decreased balance  Visit  Diagnosis: Acute pain of left knee  Stiffness of left knee, not elsewhere classified  Difficulty in walking, not elsewhere classified  Localized edema     Problem List Patient Active Problem List   Diagnosis Date Noted   Arthrofibrosis of knee joint, left 06/24/2020   S/P total knee arthroplasty, left 06/02/2020   Noncompliance with CPAP treatment 09/24/2018   A-fib ([place]) 08/31/2018   CHF (congestive heart failure) ([place]) 08/31/2018   Acute pyelonephritis 08/31/2018   Sepsis secondary to UTI ([place]) 08/31/2018   Leucocytosis 08/31/2018   Hypothyroidism 08/31/2018   CKD (chronic kidney disease) stage 3, GFR 30-59 ml/min 08/31/2018   HTN (hypertension) 08/31/2018   HLD (hyperlipidemia) 08/31/2018   Anxiety and depression 08/31/2018   Obesity 08/31/2018   Sepsis ([place]) 08/31/2018   Back pain 08/31/2018   Obstructive sleep apnea treated with continuous positive airway pressure (CPAP) 08/13/2018      Laureen Abrahams, PT, DPT 07/21/20 3:48 PM     [facility] [address] [place], [place], [address] Phone: [phone]   Fax:  [phone]  Name: Jennifer Foley MRN: [id number] Date of Birth: [birth year]

## 2020-07-22 ENCOUNTER — Encounter: Payer: Medicare Other | Admitting: Rehabilitative and Restorative Service Providers"

## 2020-07-22 ENCOUNTER — Encounter: Payer: Medicare Other | Admitting: Physical Therapy

## 2020-07-23 ENCOUNTER — Encounter: Payer: Self-pay | Admitting: Rehabilitative and Restorative Service Providers"

## 2020-07-23 ENCOUNTER — Ambulatory Visit (INDEPENDENT_AMBULATORY_CARE_PROVIDER_SITE_OTHER): Payer: Medicare Other | Admitting: Orthopaedic Surgery

## 2020-07-23 VITALS — BP 125/77 | HR 71 | Ht 60.0 in | Wt 202.3 lb

## 2020-07-23 DIAGNOSIS — M24662 Ankylosis, left knee: Secondary | ICD-10-CM

## 2020-07-23 NOTE — Progress Notes (Signed)
Post-Op Visit Note   Patient: Jennifer Foley           Date of Birth: 1956-02-14           MRN: [id number] Visit Date: 07/23/2020 PCP: Bartholome Bill, MD   Assessment & Plan: Postop knee manipulation.  Trouble got her to 110.  She got the 90 the first day of therapy today she is getting to about 70.  She states she is only been crossing her ankle pulling her back for 30 seconds and then quits.  We had a long discussion about the importance of maintaining pressure on her knee with her ankles crossed to help squeeze the fluid out of the knee and help stretch her quad.  I reviewed with her and open up her suprapatellar pouch which had completely closed down and scarred together.  We spent 15 to 20 minutes doing exercises I think she may now understand what she needs to do.  I will recheck her in 2 weeks.  Chief Complaint:  Chief Complaint  Patient presents with   Left Knee - Routine Post Op   Visit Diagnoses:  1. Arthrofibrosis of knee joint, left     Plan: Continue therapy.  Sutures removed new TED hose given which is started to rip.  Recheck 2 weeks.  Follow-Up Instructions: No follow-ups on file.   Orders:  No orders of the defined types were placed in this encounter.  No orders of the defined types were placed in this encounter.   Imaging: No results found.  PMFS History: Patient Active Problem List   Diagnosis Date Noted   Arthrofibrosis of knee joint, left 06/24/2020   S/P total knee arthroplasty, left 06/02/2020   Noncompliance with CPAP treatment 09/24/2018   A-fib ([place]) 08/31/2018   CHF (congestive heart failure) ([place]) 08/31/2018   Acute pyelonephritis 08/31/2018   Sepsis secondary to UTI ([place]) 08/31/2018   Leucocytosis 08/31/2018   Hypothyroidism 08/31/2018   CKD (chronic kidney disease) stage 3, GFR 30-59 ml/min ([place]) 08/31/2018   HTN (hypertension) 08/31/2018   HLD (hyperlipidemia) 08/31/2018   Anxiety and depression 08/31/2018    Obesity 08/31/2018   Sepsis ([place]) 08/31/2018   Back pain 08/31/2018   Obstructive sleep apnea treated with continuous positive airway pressure (CPAP) 08/13/2018   Past Medical History:  Diagnosis Date   Anemia    Arthritis    per patient, in left and right knee and both hands   Atrial fibrillation (HCC)    Bilateral breast cysts    CHF (congestive heart failure) ([place])    Chronic kidney disease    stage 3   Depression with anxiety    DM (diabetes mellitus) (HCC)    Esophageal reflux    History of hiatal hernia    HTN (hypertension)    Hyperlipemia    Hyperthyroidism    Hypothyroidism    INR (international normal ratio) abnormal    MRSA (methicillin resistant staph aureus) culture positive    Panuveitis    Pneumonia    PTSD (post-traumatic stress disorder)    Sleep apnea    per patient has CPAP, wears it at night sometimes    Family History  Problem Relation Age of Onset   Alcoholism Father    Arthritis Father    Hypertension Father        sister, mother   Diabetes Mellitus I Mother        sister   Renal Disease Mother    Stroke Mother  Epilepsy Brother     Past Surgical History:  Procedure Laterality Date   ABDOMINAL HYSTERECTOMY     BREAST BIOPSY     CATARACT EXTRACTION  2018   CESAREAN SECTION     DENTAL SURGERY     EYE SURGERY     FOOT SURGERY     HERNIA REPAIR     KNEE ARTHROSCOPY Left 07/16/2020   Procedure: ARTHROSCOPY KNEE;  Surgeon: [name], MD;  Location: [place];  Service: Orthopedics;  Laterality: Left;   KNEE CLOSED REDUCTION Left 07/16/2020   Procedure: LEFT KNEE MANIPULATION UNDER ANESTHESIA;  Surgeon: [name], MD;  Location: [place];  Service: Orthopedics;  Laterality: Left;   KNEE SURGERY     TONSILLECTOMY AND ADENOIDECTOMY     TOTAL KNEE ARTHROPLASTY Left 04/21/2020   Procedure: LEFT TOTAL KNEE ARTHROPLASTY;  Surgeon: [name], MD;  Location: [place];  Service: Orthopedics;  Laterality:  Left;   Social History   Occupational History   Not on file  Tobacco Use   Smoking status: Former Smoker    Packs/day: 0.25    Years: 34.00    Pack years: 8.50    Types: Cigarettes    Quit date: 12/15/2003    Years since quitting: 16.6   Smokeless tobacco: Former Systems developer    Types: Snuff    Quit date: 03/2020   Tobacco comment: 04/15/2020: per patient 1-2 times a day, trying to stop taking it  Vaping Use   Vaping Use: Never used  Substance and Sexual Activity   Alcohol use: Not Currently   Drug use: Never   Sexual activity: Not on file

## 2020-07-26 ENCOUNTER — Ambulatory Visit (INDEPENDENT_AMBULATORY_CARE_PROVIDER_SITE_OTHER): Payer: Medicare Other | Admitting: Physical Therapy

## 2020-07-26 ENCOUNTER — Encounter: Payer: Self-pay | Admitting: Physical Therapy

## 2020-07-26 ENCOUNTER — Other Ambulatory Visit: Payer: Self-pay

## 2020-07-26 DIAGNOSIS — R262 Difficulty in walking, not elsewhere classified: Secondary | ICD-10-CM

## 2020-07-26 DIAGNOSIS — M25562 Pain in left knee: Secondary | ICD-10-CM

## 2020-07-26 DIAGNOSIS — M25662 Stiffness of left knee, not elsewhere classified: Secondary | ICD-10-CM

## 2020-07-26 DIAGNOSIS — R600 Localized edema: Secondary | ICD-10-CM

## 2020-07-26 NOTE — Therapy (Signed)
[facility] [place], [place], [address] Phone: [phone]   Fax:  [phone]  Physical Therapy Treatment& 10th Visit Progress Note  Patient Details  Name: Jennifer Foley MRN: [id number] Date of Birth: 09-Aug-1956 Referring Provider (PT): Rodell Perna, MD   Encounter Date: 07/26/2020   Progress Note Reporting Period 06/10/2020 to 07/26/2020  See note below for Objective Data and Assessment of Progress/Goals.        PT End of Session - 07/26/20 1445    Visit Number 10    Number of Visits 16    Date for PT Re-Evaluation 08/05/20    Progress Note Due on Visit 10    PT Start Time 8841    PT Stop Time 1525    PT Time Calculation (min) 40 min    Activity Tolerance Patient tolerated treatment well;Patient limited by pain    Behavior During Therapy [facility] for tasks assessed/performed           Past Medical History:  Diagnosis Date  . Anemia   . Arthritis    per patient, in left and right knee and both hands  . Atrial fibrillation ([place])   . Bilateral breast cysts   . CHF (congestive heart failure) ([place])   . Chronic kidney disease    stage 3  . Depression with anxiety   . DM (diabetes mellitus) ([place])   . Esophageal reflux   . History of hiatal hernia   . HTN (hypertension)   . Hyperlipemia   . Hyperthyroidism   . Hypothyroidism   . INR (international normal ratio) abnormal   . MRSA (methicillin resistant staph aureus) culture positive   . Panuveitis   . Pneumonia   . PTSD (post-traumatic stress disorder)   . Sleep apnea    per patient has CPAP, wears it at night sometimes    Past Surgical History:  Procedure Laterality Date  . ABDOMINAL HYSTERECTOMY    . BREAST BIOPSY    . CATARACT EXTRACTION  2018  . CESAREAN SECTION    . DENTAL SURGERY    . EYE SURGERY    . FOOT SURGERY    . HERNIA REPAIR    . KNEE ARTHROSCOPY Left 07/16/2020   Procedure: ARTHROSCOPY KNEE;  Surgeon: [name], MD;  Location: [place];   Service: Orthopedics;  Laterality: Left;  . KNEE CLOSED REDUCTION Left 07/16/2020   Procedure: LEFT KNEE MANIPULATION UNDER ANESTHESIA;  Surgeon: [name], MD;  Location: [place];  Service: Orthopedics;  Laterality: Left;  . KNEE SURGERY    . TONSILLECTOMY AND ADENOIDECTOMY    . TOTAL KNEE ARTHROPLASTY Left 04/21/2020   Procedure: LEFT TOTAL KNEE ARTHROPLASTY;  Surgeon: [name], MD;  Location: [place];  Service: Orthopedics;  Laterality: Left;    There were no vitals filed for this visit.   Subjective Assessment - 07/26/20 1445    Subjective She did her exercises this weekend. She walked with RW outside home & in home sometimes no device.    Limitations Walking;Standing;House hold activities    Diagnostic tests X-ray    Patient Stated Goals walk without a cane    Currently in Pain? Yes    Pain Score 8     Pain Location Knee    Pain Orientation Left    Pain Descriptors / Indicators Sore;Aching;Sharp    Pain Type Chronic pain    Pain Onset More than a month ago    Pain Frequency Constant    Aggravating  Factors  bending knee,    Pain Relieving Factors ice, rest, medications              [facility] PT Assessment - 07/26/20 1445      PROM   Left Knee Extension -4    Left Knee Flexion 72                         [facility] Adult PT Treatment/Exercise - 07/26/20 1445      Knee/Hip Exercises: Stretches   Other Knee/Hip Stretches tailgate flexion x 2 min      Knee/Hip Exercises: Aerobic   Nustep Seat 7 level 5 7 minutes with 5 second hold left knee flexion.       Knee/Hip Exercises: Standing   Functional Squat 10 reps;2 sets;5 seconds    Functional Squat Limitations BUE on RW squatting over chair.       Knee/Hip Exercises: Seated   Heel Slides PROM;Left;2 sets;10 reps    Heel Slides Limitations weight shift to right, LLE heel slide, then sit up straight with weight on BLEs      Knee/Hip Exercises: Supine   Heel Prop for Knee Extension Other (comment)   10  minutes on Vaso     Vasopneumatic   Number Minutes Vasopneumatic  7 minutes    Vasopnuematic Location  Knee    Vasopneumatic Pressure High    Vasopneumatic Temperature  34      Manual Therapy   Manual therapy comments seated knee flexion with RLE LAQ 3x10; contract/relax - best ROM with this movement                        PT Long Term Goals - 07/16/20 1644      PT LONG TERM GOAL #1   Title Patient will be independent in her HEP and progression.    Baseline inital program issued on 06/10/2020    Time 8    Period Weeks    Status On-going      PT LONG TERM GOAL #2   Title Patient able to complete 5 time sit to stand </= 18 seconds to improve balance and functional mobility.    Baseline 25.3 seconds using UE support    Time 8    Period Weeks    Status On-going      PT LONG TERM GOAL #3   Title Patient will be able to amb with straigth cane for >/= 500 feet on community level surfaces with step through gait pattern.    Baseline currently using RW walking on househod distances    Time 8    Period Weeks    Status On-going      PT LONG TERM GOAL #4   Title Pt will improve her L knee flexion to >/= 90 degrees go improve funtional mobility and gait.    Baseline 50 degrees actively    Time 8    Period Weeks    Status On-going                 Plan - 07/26/20 1449    Clinical Impression Statement Patient continues to be limited in knee range with anxiety & pain restricting.  PT session used muscle energy techniques to increase range.    Personal Factors and Comorbidities Comorbidity 3+    Comorbidities L TKA 04/21/2020, a-fib, arthritis, CHF, CKD, DM, depression, HTN, sleep apnea, PTSD, hyperthyroidism    Examination-Activity Limitations Squat;Sit;Sleep;Stairs;Stand;Lift;Transfers  Examination-Participation Restrictions Other;Church;Shop;Community Activity;Driving    Stability/Clinical Decision Making Stable/Uncomplicated    Rehab Potential Fair    PT  Frequency 5x / week    PT Duration 8 weeks    PT Treatment/Interventions Cryotherapy;Electrical Stimulation;ADLs/Self Care Home Management;Ultrasound;Gait training;Stair training;Functional mobility training;Therapeutic activities;Therapeutic exercise;Moist Heat;Balance training;Neuromuscular re-education;Patient/family education;Manual techniques;Scar mobilization;Dry needling;Taping;Vasopneumatic Device;Passive range of motion    PT Next Visit Plan aggressive ROM, contract/relax,    PT Home Exercise Plan [email or web address], Access Code: [email or web address]    Consulted and Agree with Plan of Care Patient           Patient will benefit from skilled therapeutic intervention in order to improve the following deficits and impairments:  Pain, Postural dysfunction, Decreased activity tolerance, Decreased strength, Impaired flexibility, Obesity, Decreased range of motion, Increased edema, Difficulty walking, Decreased balance  Visit Diagnosis: Acute pain of left knee  Stiffness of left knee, not elsewhere classified  Difficulty in walking, not elsewhere classified  Localized edema     Problem List Patient Active Problem List   Diagnosis Date Noted  . Arthrofibrosis of knee joint, left 06/24/2020  . S/P total knee arthroplasty, left 06/02/2020  . Noncompliance with CPAP treatment 09/24/2018  . A-fib ([place]) 08/31/2018  . CHF (congestive heart failure) ([place]) 08/31/2018  . Acute pyelonephritis 08/31/2018  . Sepsis secondary to UTI ([place]) 08/31/2018  . Leucocytosis 08/31/2018  . Hypothyroidism 08/31/2018  . CKD (chronic kidney disease) stage 3, GFR 30-59 ml/min (HCC) 08/31/2018  . HTN (hypertension) 08/31/2018  . HLD (hyperlipidemia) 08/31/2018  . Anxiety and depression 08/31/2018  . Obesity 08/31/2018  . Sepsis ([place]) 08/31/2018  . Back pain 08/31/2018  . Obstructive sleep apnea treated with continuous positive airway pressure (CPAP) 08/13/2018    Jamey Reas PT, DPT 07/26/2020, 5:12 PM  [facility] [address] [place], [place], [address] Phone: [phone]   Fax:  [phone]  Name: Jennifer Foley MRN: [id number] Date of Birth: 30-May-1956

## 2020-07-27 ENCOUNTER — Encounter: Payer: Medicare Other | Admitting: Physical Therapy

## 2020-07-30 ENCOUNTER — Other Ambulatory Visit: Payer: Self-pay

## 2020-07-30 ENCOUNTER — Ambulatory Visit (INDEPENDENT_AMBULATORY_CARE_PROVIDER_SITE_OTHER): Payer: Medicare Other | Admitting: Physical Therapy

## 2020-07-30 ENCOUNTER — Encounter: Payer: Self-pay | Admitting: Physical Therapy

## 2020-07-30 DIAGNOSIS — R600 Localized edema: Secondary | ICD-10-CM

## 2020-07-30 DIAGNOSIS — M25662 Stiffness of left knee, not elsewhere classified: Secondary | ICD-10-CM | POA: Diagnosis not present

## 2020-07-30 DIAGNOSIS — M25562 Pain in left knee: Secondary | ICD-10-CM

## 2020-07-30 DIAGNOSIS — R262 Difficulty in walking, not elsewhere classified: Secondary | ICD-10-CM | POA: Diagnosis not present

## 2020-07-30 NOTE — Patient Instructions (Signed)
Access Code: [email or web address] URL: [email or web address] Date: 07/30/2020 Prepared by: Jeral Pinch  Exercises Seated Knee Flexion Stretch - 2 x daily - 10 reps - 10 sec hold

## 2020-07-30 NOTE — Therapy (Signed)
[facility] Physical Therapy [address] [place], [place], [address] Phone: [phone]   Fax:  [phone]  Physical Therapy Treatment  Patient Details  Name: Jennifer Foley MRN: [id number] Date of Birth: Sep 22, 1956 Referring Provider (PT): Rodell Perna, MD   Encounter Date: 07/30/2020   PT End of Session - 07/30/20 1434    Visit Number 11    Number of Visits 16    Date for PT Re-Evaluation 08/05/20    PT Start Time 3546    PT Stop Time 1515    PT Time Calculation (min) 41 min    Activity Tolerance Patient tolerated treatment well;Patient limited by pain    Behavior During Therapy [facility] for tasks assessed/performed           Past Medical History:  Diagnosis Date   Anemia    Arthritis    per patient, in left and right knee and both hands   Atrial fibrillation (HCC)    Bilateral breast cysts    CHF (congestive heart failure) (HCC)    Chronic kidney disease    stage 3   Depression with anxiety    DM (diabetes mellitus) ([place])    Esophageal reflux    History of hiatal hernia    HTN (hypertension)    Hyperlipemia    Hyperthyroidism    Hypothyroidism    INR (international normal ratio) abnormal    MRSA (methicillin resistant staph aureus) culture positive    Panuveitis    Pneumonia    PTSD (post-traumatic stress disorder)    Sleep apnea    per patient has CPAP, wears it at night sometimes    Past Surgical History:  Procedure Laterality Date   ABDOMINAL HYSTERECTOMY     BREAST BIOPSY     CATARACT EXTRACTION  2018   CESAREAN SECTION     DENTAL SURGERY     EYE SURGERY     FOOT SURGERY     HERNIA REPAIR     KNEE ARTHROSCOPY Left 07/16/2020   Procedure: ARTHROSCOPY KNEE;  Surgeon: [name], MD;  Location: [place];  Service: Orthopedics;  Laterality: Left;   KNEE CLOSED REDUCTION Left 07/16/2020   Procedure: LEFT KNEE MANIPULATION UNDER ANESTHESIA;  Surgeon: [name], MD;  Location: [place];  Service:  Orthopedics;  Laterality: Left;   KNEE SURGERY     TONSILLECTOMY AND ADENOIDECTOMY     TOTAL KNEE ARTHROPLASTY Left 04/21/2020   Procedure: LEFT TOTAL KNEE ARTHROPLASTY;  Surgeon: [name], MD;  Location: [place];  Service: Orthopedics;  Laterality: Left;    There were no vitals filed for this visit.   Subjective Assessment - 07/30/20 1436    Subjective Pt reports she does most of her exercise, doing well.  Pt reports she doesn't think  her knee is as tight as it has been    Patient Stated Goals walk without a cane    Currently in Pain? Yes    Pain Score 6     Pain Location Knee    Pain Orientation Left    Pain Descriptors / Indicators Aching;Sore    Pain Type Surgical pain              [facility] PT Assessment - 07/30/20 0001      PROM   Left Knee Flexion 74                         [facility] Adult PT Treatment/Exercise - 07/30/20 0001  Knee/Hip Exercises: Stretches   Other Knee/Hip Stretches knee flexion low load long duration stetching using knee extension machine with heat on quad  5 x 1 min      Knee/Hip Exercises: Aerobic   Nustep Seat 6 level 5 7 minutes with 5 second hold left knee flexion.       Knee/Hip Exercises: Standing   Other Standing Knee Exercises self knee flexion mobs with foot on 8" step      Modalities   Modalities Moist Heat      Moist Heat Therapy   Number Minutes Moist Heat 10 Minutes    Moist Heat Location Knee   and quad Lt during deep stetching      Vasopneumatic   Number Minutes Vasopneumatic  --   pt declined as it "  squeezed her leg to bad"                       PT Long Term Goals - 07/16/20 1644      PT LONG TERM GOAL #1   Title Patient will be independent in her HEP and progression.    Baseline inital program issued on 06/10/2020    Time 8    Period Weeks    Status On-going      PT LONG TERM GOAL #2   Title Patient able to complete 5 time sit to stand </= 18 seconds to improve balance and  functional mobility.    Baseline 25.3 seconds using UE support    Time 8    Period Weeks    Status On-going      PT LONG TERM GOAL #3   Title Patient will be able to amb with straigth cane for >/= 500 feet on community level surfaces with step through gait pattern.    Baseline currently using RW walking on househod distances    Time 8    Period Weeks    Status On-going      PT LONG TERM GOAL #4   Title Pt will improve her L knee flexion to >/= 90 degrees go improve funtional mobility and gait.    Baseline 50 degrees actively    Time 8    Period Weeks    Status On-going                 Plan - 07/30/20 1523    Clinical Impression Statement Performed knee flexion mobs in a different way today, used knee extension machine for low load long duration stretching using 20# and 1 min stretching.  Followed by some standing and then sitting self mobilization.  Pt had some improved motion at end of session in seated.  Sheis still very anxious with working on motion. Reviewed self mobs in sitting and pt reports she will focus on that all weekend along with increasing her walking. also used some heat on the knee/quad during stretches.    Rehab Potential Fair    PT Frequency 5x / week    PT Duration 8 weeks    PT Treatment/Interventions Cryotherapy;Electrical Stimulation;ADLs/Self Care Home Management;Ultrasound;Gait training;Stair training;Functional mobility training;Therapeutic activities;Therapeutic exercise;Moist Heat;Balance training;Neuromuscular re-education;Patient/family education;Manual techniques;Scar mobilization;Dry needling;Taping;Vasopneumatic Device;Passive range of motion    PT Next Visit Plan aggressive ROM, contract/relax,    PT Home Exercise Plan [email or web address], Access Code: [email or web address]    Consulted and Agree with Plan of Care Patient           Patient will benefit from skilled therapeutic intervention in order to improve  the following deficits and impairments:  Pain, Postural  dysfunction, Decreased activity tolerance, Decreased strength, Impaired flexibility, Obesity, Decreased range of motion, Increased edema, Difficulty walking, Decreased balance  Visit Diagnosis: Acute pain of left knee  Stiffness of left knee, not elsewhere classified  Difficulty in walking, not elsewhere classified  Localized edema     Problem List Patient Active Problem List   Diagnosis Date Noted   Arthrofibrosis of knee joint, left 06/24/2020   S/P total knee arthroplasty, left 06/02/2020   Noncompliance with CPAP treatment 09/24/2018   A-fib ([place]) 08/31/2018   CHF (congestive heart failure) ([place]) 08/31/2018   Acute pyelonephritis 08/31/2018   Sepsis secondary to UTI ([place]) 08/31/2018   Leucocytosis 08/31/2018   Hypothyroidism 08/31/2018   CKD (chronic kidney disease) stage 3, GFR 30-59 ml/min ([place]) 08/31/2018   HTN (hypertension) 08/31/2018   HLD (hyperlipidemia) 08/31/2018   Anxiety and depression 08/31/2018   Obesity 08/31/2018   Sepsis ([place]) 08/31/2018   Back pain 08/31/2018   Obstructive sleep apnea treated with continuous positive airway pressure (CPAP) 08/13/2018    Jeral Pinch PT  07/30/2020, 3:27 PM  [facility] Physical Therapy [address] [place], [place], [address] Phone: [phone]   Fax:  [phone]  Name: Ginnette Gates MRN: [id number] Date of Birth: 1956-01-04

## 2020-08-02 ENCOUNTER — Other Ambulatory Visit: Payer: Self-pay

## 2020-08-02 ENCOUNTER — Encounter: Payer: Self-pay | Admitting: Physical Therapy

## 2020-08-02 ENCOUNTER — Ambulatory Visit (INDEPENDENT_AMBULATORY_CARE_PROVIDER_SITE_OTHER): Payer: Medicare Other | Admitting: Physical Therapy

## 2020-08-02 DIAGNOSIS — R262 Difficulty in walking, not elsewhere classified: Secondary | ICD-10-CM

## 2020-08-02 DIAGNOSIS — R600 Localized edema: Secondary | ICD-10-CM | POA: Diagnosis not present

## 2020-08-02 DIAGNOSIS — M25562 Pain in left knee: Secondary | ICD-10-CM | POA: Diagnosis not present

## 2020-08-02 DIAGNOSIS — M25662 Stiffness of left knee, not elsewhere classified: Secondary | ICD-10-CM

## 2020-08-02 NOTE — Therapy (Signed)
[facility] Physical Therapy [address] [place], [place], [address] Phone: [phone]   Fax:  [phone]  Physical Therapy Treatment  Patient Details  Name: Jennifer Foley MRN: [id number] Date of Birth: 05-Mar-1956 Referring Provider (PT): Rodell Perna, MD   Encounter Date: 08/02/2020   PT End of Session - 08/02/20 1524    Visit Number 12    Number of Visits 23    Date for PT Re-Evaluation 09/10/20    PT Start Time 2505    PT Stop Time 1605    PT Time Calculation (min) 44 min    Activity Tolerance Patient tolerated treatment well;Patient limited by pain    Behavior During Therapy [facility] for tasks assessed/performed           Past Medical History:  Diagnosis Date   Anemia    Arthritis    per patient, in left and right knee and both hands   Atrial fibrillation (HCC)    Bilateral breast cysts    CHF (congestive heart failure) (HCC)    Chronic kidney disease    stage 3   Depression with anxiety    DM (diabetes mellitus) ([place])    Esophageal reflux    History of hiatal hernia    HTN (hypertension)    Hyperlipemia    Hyperthyroidism    Hypothyroidism    INR (international normal ratio) abnormal    MRSA (methicillin resistant staph aureus) culture positive    Panuveitis    Pneumonia    PTSD (post-traumatic stress disorder)    Sleep apnea    per patient has CPAP, wears it at night sometimes    Past Surgical History:  Procedure Laterality Date   ABDOMINAL HYSTERECTOMY     BREAST BIOPSY     CATARACT EXTRACTION  2018   CESAREAN SECTION     DENTAL SURGERY     EYE SURGERY     FOOT SURGERY     HERNIA REPAIR     KNEE ARTHROSCOPY Left 07/16/2020   Procedure: ARTHROSCOPY KNEE;  Surgeon: [name], MD;  Location: [place];  Service: Orthopedics;  Laterality: Left;   KNEE CLOSED REDUCTION Left 07/16/2020   Procedure: LEFT KNEE MANIPULATION UNDER ANESTHESIA;  Surgeon: [name], MD;  Location: [place];  Service:  Orthopedics;  Laterality: Left;   KNEE SURGERY     TONSILLECTOMY AND ADENOIDECTOMY     TOTAL KNEE ARTHROPLASTY Left 04/21/2020   Procedure: LEFT TOTAL KNEE ARTHROPLASTY;  Surgeon: [name], MD;  Location: [place];  Service: Orthopedics;  Laterality: Left;    There were no vitals filed for this visit.   Subjective Assessment - 08/02/20 1521    Subjective She did 2 exercises - tailgate flexion and sitting in chair using RLE to flex LLE.    Patient Stated Goals walk without a cane    Currently in Pain? Yes    Pain Score 5     Pain Location Knee    Pain Orientation Left    Pain Descriptors / Indicators Aching;Sore    Pain Type Chronic pain;Surgical pain    Pain Onset More than a month ago    Pain Frequency Constant    Aggravating Factors  bending knee    Pain Relieving Factors medication, ice, heat                             [facility] Adult PT Treatment/Exercise - 08/02/20 1521  Ambulation/Gait   Ambulation/Gait Yes    Ambulation/Gait Assistance 5: Supervision    Ambulation/Gait Assistance Details verbal & demo cues on use of cane, terminal stance heel rise with knee flexion, initial contact with heel & not circumducting in swing.      Ambulation Distance (Feet) 100 Feet   100' X 2   Assistive device Straight cane    Ambulation Surface Level;Indoor      Knee/Hip Exercises: Stretches   Other Knee/Hip Stretches tailgate knee flexion using RLE to flex LLE.  Contract relax to increase knee flexion.       Knee/Hip Exercises: Aerobic   Nustep Seat 6 level 5 for 7 minutes with 5 second hold left knee flexion stretch & 5 second press out.       Knee/Hip Exercises: Standing   Terminal Knee Extension Strengthening;Left;10 reps;3 sets;Theraband    Theraband Level (Terminal Knee Extension) Level 3 (Green)    Terminal Knee Extension Limitations 1st set bilateral stance, 2nd set initial contact with DF, 3rd set terminal stance -  tactile cues for full motion &  control.     Functional Squat 10 reps;2 sets;5 seconds;Limitations   TRX   Functional Squat Limitations TRX squat to chair posteriorly with PT cueing knee flexion & equal weight bearing.     Other Standing Knee Exercises self knee flexion mobs with foot on 8" step      Modalities   Modalities --      Moist Heat Therapy   Moist Heat Location --      Vasopneumatic   Number Minutes Vasopneumatic  --      Manual Therapy   Manual therapy comments seated knee flexion with RLE LAQ 3x10; contract/relax - best ROM with this movement                     PT Short Term Goals - 08/02/20 1637      PT SHORT TERM GOAL #1   Title Patient demonstrates updated HEP    Status On-going    Target Date 08/13/20      PT SHORT TERM GOAL #2   Title Knee left flexion 80*    Target Date 09/10/20             PT Long Term Goals - 08/02/20 1637      PT LONG TERM GOAL #1   Title Patient will be independent in her HEP and progression.    Baseline inital program issued on 06/10/2020    Time 8    Period Weeks    Status On-going    Target Date 09/10/20      PT LONG TERM GOAL #2   Title Patient able to complete 5 time sit to stand </= 18 seconds to improve balance and functional mobility.    Baseline 25.3 seconds using UE support    Time 8    Period Weeks    Status On-going    Target Date 09/10/20      PT LONG TERM GOAL #3   Title Patient will be able to amb with straigth cane for >/= 500 feet on community level surfaces with step through gait pattern.    Baseline currently using RW walking on househod distances    Time 8    Period Weeks    Status On-going    Target Date 09/10/20      PT LONG TERM GOAL #4   Title Pt will improve her L knee flexion to >/= 90  degrees go improve funtional mobility and gait.    Baseline 50 degrees actively    Time 8    Period Weeks    Status On-going    Target Date 09/10/20                 Plan - 08/02/20 1524    Clinical Impression  Statement PT instructed in gait with cane and pt improved by end of session.  Manual therapy to increase knee ROM.  PT added standing theraband terminal knee extension in various gait positions which pt found challenging.    Rehab Potential Fair    PT Frequency 2x / week    PT Duration 8 weeks    PT Treatment/Interventions Cryotherapy;Electrical Stimulation;ADLs/Self Care Home Management;Ultrasound;Gait training;Stair training;Functional mobility training;Therapeutic activities;Therapeutic exercise;Moist Heat;Balance training;Neuromuscular re-education;Patient/family education;Manual techniques;Scar mobilization;Dry needling;Taping;Vasopneumatic Device;Passive range of motion    PT Next Visit Plan aggressive ROM, contract/relax,    PT Home Exercise Plan [email or web address], Access Code: [email or web address]    Consulted and Agree with Plan of Care Patient           Patient will benefit from skilled therapeutic intervention in order to improve the following deficits and impairments:  Pain, Postural dysfunction, Decreased activity tolerance, Decreased strength, Impaired flexibility, Obesity, Decreased range of motion, Increased edema, Difficulty walking, Decreased balance  Visit Diagnosis: Acute pain of left knee  Stiffness of left knee, not elsewhere classified  Difficulty in walking, not elsewhere classified  Localized edema     Problem List Patient Active Problem List   Diagnosis Date Noted   Arthrofibrosis of knee joint, left 06/24/2020   S/P total knee arthroplasty, left 06/02/2020   Noncompliance with CPAP treatment 09/24/2018   A-fib ([place]) 08/31/2018   CHF (congestive heart failure) ([place]) 08/31/2018   Acute pyelonephritis 08/31/2018   Sepsis secondary to UTI ([place]) 08/31/2018   Leucocytosis 08/31/2018   Hypothyroidism 08/31/2018   CKD (chronic kidney disease) stage 3, GFR 30-59 ml/min ([place]) 08/31/2018   HTN (hypertension) 08/31/2018   HLD (hyperlipidemia) 08/31/2018   Anxiety and  depression 08/31/2018   Obesity 08/31/2018   Sepsis ([place]) 08/31/2018   Back pain 08/31/2018   Obstructive sleep apnea treated with continuous positive airway pressure (CPAP) 08/13/2018    Jamey Reas PT, DPT 08/02/2020, 4:41 PM  [facility] Physical Therapy [address] [place], [place], [address] Phone: [phone]   Fax:  [phone]  Name: Carmellia Kreisler MRN: [id number] Date of Birth: 07-Mar-1956

## 2020-08-03 ENCOUNTER — Other Ambulatory Visit: Payer: Self-pay | Admitting: Orthopaedic Surgery

## 2020-08-03 ENCOUNTER — Ambulatory Visit (INDEPENDENT_AMBULATORY_CARE_PROVIDER_SITE_OTHER): Payer: Medicare Other | Admitting: Physical Therapy

## 2020-08-03 ENCOUNTER — Encounter: Payer: Self-pay | Admitting: Physical Therapy

## 2020-08-03 ENCOUNTER — Telehealth: Payer: Self-pay

## 2020-08-03 DIAGNOSIS — R600 Localized edema: Secondary | ICD-10-CM | POA: Diagnosis not present

## 2020-08-03 DIAGNOSIS — M25662 Stiffness of left knee, not elsewhere classified: Secondary | ICD-10-CM

## 2020-08-03 DIAGNOSIS — R262 Difficulty in walking, not elsewhere classified: Secondary | ICD-10-CM

## 2020-08-03 DIAGNOSIS — M25562 Pain in left knee: Secondary | ICD-10-CM | POA: Diagnosis not present

## 2020-08-03 MED ORDER — HYDROCODONE-ACETAMINOPHEN 5-325 MG PO TABS: 1.0000 | ORAL_TABLET | Freq: Four times a day (QID) | ORAL | 0 refills | Status: DC | PRN

## 2020-08-03 NOTE — Telephone Encounter (Signed)
I called patient and advised. 

## 2020-08-03 NOTE — Therapy (Signed)
[facility] Physical Therapy [address] [place], [place], [address] Phone: [phone]   Fax:  [phone]  Physical Therapy Treatment  Patient Details  Name: Jennifer Foley MRN: [id number] Date of Birth: 1955/12/18 Referring Provider (PT): Rodell Perna, MD   Encounter Date: 08/03/2020   PT End of Session - 08/03/20 1448    Visit Number 13    Number of Visits 23    Date for PT Re-Evaluation 09/10/20    PT Start Time 6734    PT Stop Time 1525    PT Time Calculation (min) 41 min    Activity Tolerance Patient tolerated treatment well;Patient limited by pain    Behavior During Therapy [facility] for tasks assessed/performed           Past Medical History:  Diagnosis Date  . Anemia   . Arthritis    per patient, in left and right knee and both hands  . Atrial fibrillation ([place])   . Bilateral breast cysts   . CHF (congestive heart failure) ([place])   . Chronic kidney disease    stage 3  . Depression with anxiety   . DM (diabetes mellitus) ([place])   . Esophageal reflux   . History of hiatal hernia   . HTN (hypertension)   . Hyperlipemia   . Hyperthyroidism   . Hypothyroidism   . INR (international normal ratio) abnormal   . MRSA (methicillin resistant staph aureus) culture positive   . Panuveitis   . Pneumonia   . PTSD (post-traumatic stress disorder)   . Sleep apnea    per patient has CPAP, wears it at night sometimes    Past Surgical History:  Procedure Laterality Date  . ABDOMINAL HYSTERECTOMY    . BREAST BIOPSY    . CATARACT EXTRACTION  2018  . CESAREAN SECTION    . DENTAL SURGERY    . EYE SURGERY    . FOOT SURGERY    . HERNIA REPAIR    . KNEE ARTHROSCOPY Left 07/16/2020   Procedure: ARTHROSCOPY KNEE;  Surgeon: [name], MD;  Location: [place];  Service: Orthopedics;  Laterality: Left;  . KNEE CLOSED REDUCTION Left 07/16/2020   Procedure: LEFT KNEE MANIPULATION UNDER ANESTHESIA;  Surgeon: [name], MD;  Location: [place];  Service:  Orthopedics;  Laterality: Left;  . KNEE SURGERY    . TONSILLECTOMY AND ADENOIDECTOMY    . TOTAL KNEE ARTHROPLASTY Left 04/21/2020   Procedure: LEFT TOTAL KNEE ARTHROPLASTY;  Surgeon: [name], MD;  Location: [place];  Service: Orthopedics;  Laterality: Left;    There were no vitals filed for this visit.   Subjective Assessment - 08/03/20 1444    Subjective She arrived ambulating with [facility] for first time.    Patient Stated Goals walk without a cane    Currently in Pain? Yes    Pain Score 6     Pain Location Knee    Pain Orientation Left    Pain Descriptors / Indicators Aching;Sore    Pain Type Chronic pain    Pain Onset More than a month ago    Pain Frequency Constant    Aggravating Factors  bending & walking more    Pain Relieving Factors medication & ice, sit to rest                             [facility] Adult PT Treatment/Exercise - 08/03/20 1444      Ambulation/Gait  Ambulation/Gait Yes    Ambulation/Gait Assistance 5: Supervision    Ambulation Distance (Feet) 100 Feet   100' X 2   Assistive device Small based quad cane      Knee/Hip Exercises: Stretches   Knee: Self-Stretch to increase Flexion Left;5 reps;20 seconds    Knee: Self-Stretch Limitations lean right, flex left knee max, then weight left ischuim    Other Knee/Hip Stretches tailgate knee flexion using RLE to flex LLE.  Contract relax to increase knee flexion.       Knee/Hip Exercises: Aerobic   Nustep Seat 6 level 5 for 7 minutes with 5 second hold left knee flexion stretch & 5 second press out.       Knee/Hip Exercises: Machines for Strengthening   Cybex Leg Press 100# BLEs 15 reps with 5 sec hold max flexion      Knee/Hip Exercises: Standing   Terminal Knee Extension Strengthening;Left;10 reps;3 sets;Theraband    Theraband Level (Terminal Knee Extension) Level 3 (Green)    Terminal Knee Extension Limitations 1st set bilateral stance, 2nd set initial contact with DF, 3rd set terminal  stance -  tactile cues for full motion & control.     Functional Squat --    Functional Squat Limitations --    Other Standing Knee Exercises self knee flexion mobs with foot on 8" step      Vasopneumatic   Number Minutes Vasopneumatic  10 minutes    Vasopnuematic Location  Knee    Vasopneumatic Pressure Low    Vasopneumatic Temperature  34      Manual Therapy   Manual therapy comments seated knee flexion with RLE LAQ 3x10; contract/relax - best ROM with this movement                     PT Short Term Goals - 08/02/20 1637      PT SHORT TERM GOAL #1   Title Patient demonstrates updated HEP    Status On-going    Target Date 08/13/20      PT SHORT TERM GOAL #2   Title Knee left flexion 80*    Target Date 09/10/20             PT Long Term Goals - 08/02/20 1637      PT LONG TERM GOAL #1   Title Patient will be independent in her HEP and progression.    Baseline inital program issued on 06/10/2020    Time 8    Period Weeks    Status On-going    Target Date 09/10/20      PT LONG TERM GOAL #2   Title Patient able to complete 5 time sit to stand </= 18 seconds to improve balance and functional mobility.    Baseline 25.3 seconds using UE support    Time 8    Period Weeks    Status On-going    Target Date 09/10/20      PT LONG TERM GOAL #3   Title Patient will be able to amb with straigth cane for >/= 500 feet on community level surfaces with step through gait pattern.    Baseline currently using RW walking on househod distances    Time 8    Period Weeks    Status On-going    Target Date 09/10/20      PT LONG TERM GOAL #4   Title Pt will improve her L knee flexion to >/= 90 degrees go improve funtional mobility and gait.  Baseline 50 degrees actively    Time 8    Period Weeks    Status On-going    Target Date 09/10/20                 Plan - 08/03/20 1448    Clinical Impression Statement PT session continues to work on range & functional  strength. She continues to have significant limitations in knee flexion.    Rehab Potential Fair    PT Frequency 2x / week    PT Duration 8 weeks    PT Treatment/Interventions Cryotherapy;Electrical Stimulation;ADLs/Self Care Home Management;Ultrasound;Gait training;Stair training;Functional mobility training;Therapeutic activities;Therapeutic exercise;Moist Heat;Balance training;Neuromuscular re-education;Patient/family education;Manual techniques;Scar mobilization;Dry needling;Taping;Vasopneumatic Device;Passive range of motion    PT Next Visit Plan aggressive ROM, contract/relax,    PT Home Exercise Plan [email or web address], Access Code: [email or web address]    Consulted and Agree with Plan of Care Patient           Patient will benefit from skilled therapeutic intervention in order to improve the following deficits and impairments:  Pain, Postural dysfunction, Decreased activity tolerance, Decreased strength, Impaired flexibility, Obesity, Decreased range of motion, Increased edema, Difficulty walking, Decreased balance  Visit Diagnosis: Acute pain of left knee  Stiffness of left knee, not elsewhere classified  Difficulty in walking, not elsewhere classified  Localized edema     Problem List Patient Active Problem List   Diagnosis Date Noted  . Arthrofibrosis of knee joint, left 06/24/2020  . S/P total knee arthroplasty, left 06/02/2020  . Noncompliance with CPAP treatment 09/24/2018  . A-fib ([place]) 08/31/2018  . CHF (congestive heart failure) ([place]) 08/31/2018  . Acute pyelonephritis 08/31/2018  . Sepsis secondary to UTI ([place]) 08/31/2018  . Leucocytosis 08/31/2018  . Hypothyroidism 08/31/2018  . CKD (chronic kidney disease) stage 3, GFR 30-59 ml/min (H[facility]) 08/31/2018  . HTN (hypertension) 08/31/2018  . HLD (hyperlipidemia) 08/31/2018  . Anxiety and depression 08/31/2018  . Obesity 08/31/2018  . Sepsis ([place]) 08/31/2018  . Back pain 08/31/2018  . Obstructive sleep apnea treated with continuous  positive airway pressure (CPAP) 08/13/2018    Jamey Reas PT, DPT 08/03/2020, 4:15 PM  [facility] Physical Therapy [address] [place], [place], [address] Phone: [phone]   Fax:  [phone]  Name: [place] Paone MRN: [id number] Date of Birth: 1956/09/27

## 2020-08-03 NOTE — Telephone Encounter (Signed)
Please advise 

## 2020-08-03 NOTE — Telephone Encounter (Signed)
Patient called she is requesting prescription refill for hydrocodone call back:[phone]

## 2020-08-03 NOTE — Telephone Encounter (Signed)
[facility]. I sent in # [phone] norco thanks

## 2020-08-04 ENCOUNTER — Encounter: Payer: Medicare Other | Admitting: Physical Therapy

## 2020-08-05 ENCOUNTER — Other Ambulatory Visit: Payer: Self-pay

## 2020-08-05 ENCOUNTER — Encounter: Payer: Self-pay | Admitting: Physical Therapy

## 2020-08-05 ENCOUNTER — Ambulatory Visit (INDEPENDENT_AMBULATORY_CARE_PROVIDER_SITE_OTHER): Payer: Medicare Other | Admitting: Physical Therapy

## 2020-08-05 DIAGNOSIS — M25662 Stiffness of left knee, not elsewhere classified: Secondary | ICD-10-CM

## 2020-08-05 DIAGNOSIS — R262 Difficulty in walking, not elsewhere classified: Secondary | ICD-10-CM | POA: Diagnosis not present

## 2020-08-05 DIAGNOSIS — R600 Localized edema: Secondary | ICD-10-CM | POA: Diagnosis not present

## 2020-08-05 DIAGNOSIS — M25562 Pain in left knee: Secondary | ICD-10-CM

## 2020-08-05 NOTE — Therapy (Signed)
[facility] [address] [place], [place], [address] Phone: [phone]   Fax:  [phone]  Physical Therapy Treatment  Patient Details  Name: Jennifer Foley MRN: [id number] Date of Birth: [birth year] Referring Provider (PT): Rodell Perna, MD   Encounter Date: 08/05/2020   PT End of Session - 08/05/20 1512    Visit Number 14    Number of Visits 23    Date for PT Re-Evaluation 09/10/20    PT Start Time 2542    PT Stop Time 1513    PT Time Calculation (min) 36 min    Activity Tolerance Patient tolerated treatment well;Patient limited by pain    Behavior During Therapy [facility] for tasks assessed/performed           Past Medical History:  Diagnosis Date  . Anemia   . Arthritis    per patient, in left and right knee and both hands  . Atrial fibrillation ([place])   . Bilateral breast cysts   . CHF (congestive heart failure) ([place])   . Chronic kidney disease    stage 3  . Depression with anxiety   . DM (diabetes mellitus) ([place])   . Esophageal reflux   . History of hiatal hernia   . HTN (hypertension)   . Hyperlipemia   . Hyperthyroidism   . Hypothyroidism   . INR (international normal ratio) abnormal   . MRSA (methicillin resistant staph aureus) culture positive   . Panuveitis   . Pneumonia   . PTSD (post-traumatic stress disorder)   . Sleep apnea    per patient has CPAP, wears it at night sometimes    Past Surgical History:  Procedure Laterality Date  . ABDOMINAL HYSTERECTOMY    . BREAST BIOPSY    . CATARACT EXTRACTION  2018  . CESAREAN SECTION    . DENTAL SURGERY    . EYE SURGERY    . FOOT SURGERY    . HERNIA REPAIR    . KNEE ARTHROSCOPY Left 07/16/2020   Procedure: ARTHROSCOPY KNEE;  Surgeon: [name], MD;  Location: [place];  Service: Orthopedics;  Laterality: Left;  . KNEE CLOSED REDUCTION Left 07/16/2020   Procedure: LEFT KNEE MANIPULATION UNDER ANESTHESIA;  Surgeon: [name], MD;  Location: [place];  Service:  Orthopedics;  Laterality: Left;  . KNEE SURGERY    . TONSILLECTOMY AND ADENOIDECTOMY    . TOTAL KNEE ARTHROPLASTY Left 04/21/2020   Procedure: LEFT TOTAL KNEE ARTHROPLASTY;  Surgeon: [name], MD;  Location: [place];  Service: Orthopedics;  Laterality: Left;    There were no vitals filed for this visit.   Subjective Assessment - 08/05/20 1440    Subjective "I'm just sick of this knee." Reports she hasn't taken pain medication today.    Patient Stated Goals walk without a cane    Currently in Pain? Yes    Pain Score 8     Pain Location Knee    Pain Orientation Left    Pain Descriptors / Indicators Aching;Sore    Pain Type Chronic pain    Pain Onset More than a month ago    Pain Frequency Constant    Aggravating Factors  bending and walking more    Pain Relieving Factors medication, ice, resting                             [facility] Adult PT Treatment/Exercise - 08/05/20 0001      Knee/Hip  Exercises: Stretches   Knee: Self-Stretch to increase Flexion Left;5 reps;20 seconds    Knee: Self-Stretch Limitations lean right, flex left knee max, then weight left ischuim    Other Knee/Hip Stretches tailgate knee flexion using RLE to flex LLE.  Contract relax to increase knee flexion.       Knee/Hip Exercises: Aerobic   Nustep Seat 7 level 6 for 8 minutes with 5 second hold left knee flexion stretch & 5 second press out.       Knee/Hip Exercises: Standing   Knee Flexion Left;10 reps      Manual Therapy   Manual therapy comments seated knee flexion with RLE LAQ 3x10; contract/relax - best ROM with this movement                     PT Short Term Goals - 08/02/20 1637      PT SHORT TERM GOAL #1   Title Patient demonstrates updated HEP    Status On-going    Target Date 08/13/20      PT SHORT TERM GOAL #2   Title Knee left flexion 80*    Target Date 09/10/20             PT Long Term Goals - 08/02/20 1637      PT LONG TERM GOAL #1   Title Patient  will be independent in her HEP and progression.    Baseline inital program issued on 06/10/2020    Time 8    Period Weeks    Status On-going    Target Date 09/10/20      PT LONG TERM GOAL #2   Title Patient able to complete 5 time sit to stand </= 18 seconds to improve balance and functional mobility.    Baseline 25.3 seconds using UE support    Time 8    Period Weeks    Status On-going    Target Date 09/10/20      PT LONG TERM GOAL #3   Title Patient will be able to amb with straigth cane for >/= 500 feet on community level surfaces with step through gait pattern.    Baseline currently using RW walking on househod distances    Time 8    Period Weeks    Status On-going    Target Date 09/10/20      PT LONG TERM GOAL #4   Title Pt will improve her L knee flexion to >/= 90 degrees go improve funtional mobility and gait.    Baseline 50 degrees actively    Time 8    Period Weeks    Status On-going    Target Date 09/10/20                 Plan - 08/05/20 1513    Clinical Impression Statement Pt still with significant limitations in knee flexion with guarded movements limiting progress.  To MD tomorrow after session.    Rehab Potential Fair    PT Frequency 2x / week    PT Duration 8 weeks    PT Treatment/Interventions Cryotherapy;Electrical Stimulation;ADLs/Self Care Home Management;Ultrasound;Gait training;Stair training;Functional mobility training;Therapeutic activities;Therapeutic exercise;Moist Heat;Balance training;Neuromuscular re-education;Patient/family education;Manual techniques;Scar mobilization;Dry needling;Taping;Vasopneumatic Device;Passive range of motion    PT Next Visit Plan measure, needs MD note - continue with aggressive ROM as able    PT [place], Access Code: [email or web address]    Consulted and Agree with Plan of Care Patient  Patient will benefit from skilled therapeutic intervention in order to improve the following deficits and  impairments:  Pain, Postural dysfunction, Decreased activity tolerance, Decreased strength, Impaired flexibility, Obesity, Decreased range of motion, Increased edema, Difficulty walking, Decreased balance  Visit Diagnosis: Acute pain of left knee  Stiffness of left knee, not elsewhere classified  Difficulty in walking, not elsewhere classified  Localized edema     Problem List Patient Active Problem List   Diagnosis Date Noted  . Arthrofibrosis of knee joint, left 06/24/2020  . S/P total knee arthroplasty, left 06/02/2020  . Noncompliance with CPAP treatment 09/24/2018  . A-fib ([place]) 08/31/2018  . CHF (congestive heart failure) ([place]) 08/31/2018  . Acute pyelonephritis 08/31/2018  . Sepsis secondary to UTI ([place]) 08/31/2018  . Leucocytosis 08/31/2018  . Hypothyroidism 08/31/2018  . CKD (chronic kidney disease) stage 3, GFR 30-59 ml/min (HCC) 08/31/2018  . HTN (hypertension) 08/31/2018  . HLD (hyperlipidemia) 08/31/2018  . Anxiety and depression 08/31/2018  . Obesity 08/31/2018  . Sepsis ([place]) 08/31/2018  . Back pain 08/31/2018  . Obstructive sleep apnea treated with continuous positive airway pressure (CPAP) 08/13/2018      Laureen Abrahams, PT, DPT 08/05/20 3:15 PM       [facility] [address] [place], [place], [address] Phone: [phone]   Fax:  [phone]  Name: Jennifer Foley MRN: [id number] Date of Birth: 1956-07-21

## 2020-08-06 ENCOUNTER — Encounter: Payer: Medicare Other | Admitting: Physical Therapy

## 2020-08-06 ENCOUNTER — Ambulatory Visit (INDEPENDENT_AMBULATORY_CARE_PROVIDER_SITE_OTHER): Payer: Medicare Other | Admitting: Physical Therapy

## 2020-08-06 ENCOUNTER — Ambulatory Visit (INDEPENDENT_AMBULATORY_CARE_PROVIDER_SITE_OTHER): Payer: Medicare Other | Admitting: Orthopaedic Surgery

## 2020-08-06 ENCOUNTER — Encounter: Payer: Self-pay | Admitting: Orthopaedic Surgery

## 2020-08-06 VITALS — Ht 60.0 in | Wt 202.0 lb

## 2020-08-06 DIAGNOSIS — R600 Localized edema: Secondary | ICD-10-CM | POA: Diagnosis not present

## 2020-08-06 DIAGNOSIS — R262 Difficulty in walking, not elsewhere classified: Secondary | ICD-10-CM

## 2020-08-06 DIAGNOSIS — M25562 Pain in left knee: Secondary | ICD-10-CM

## 2020-08-06 DIAGNOSIS — M25662 Stiffness of left knee, not elsewhere classified: Secondary | ICD-10-CM

## 2020-08-06 DIAGNOSIS — M24662 Ankylosis, left knee: Secondary | ICD-10-CM

## 2020-08-06 NOTE — Therapy (Signed)
[facility] [address] [place], [place], [address] Phone: [phone]   Fax:  [phone]  Physical Therapy Treatment  Patient Details  Name: Jennifer Foley MRN: [id number] Date of Birth: [birth year] Referring Provider (PT): Rodell Perna, MD   Encounter Date: 08/06/2020   PT End of Session - 08/06/20 1524    Visit Number 15    Number of Visits 23    Date for PT Re-Evaluation 09/10/20    PT Start Time 1062    PT Stop Time 1525    PT Time Calculation (min) 38 min    Activity Tolerance Patient tolerated treatment well;Patient limited by pain    Behavior During Therapy [facility] for tasks assessed/performed           Past Medical History:  Diagnosis Date  . Anemia   . Arthritis    per patient, in left and right knee and both hands  . Atrial fibrillation ([place])   . Bilateral breast cysts   . CHF (congestive heart failure) ([place])   . Chronic kidney disease    stage 3  . Depression with anxiety   . DM (diabetes mellitus) ([place])   . Esophageal reflux   . History of hiatal hernia   . HTN (hypertension)   . Hyperlipemia   . Hyperthyroidism   . Hypothyroidism   . INR (international normal ratio) abnormal   . MRSA (methicillin resistant staph aureus) culture positive   . Panuveitis   . Pneumonia   . PTSD (post-traumatic stress disorder)   . Sleep apnea    per patient has CPAP, wears it at night sometimes    Past Surgical History:  Procedure Laterality Date  . ABDOMINAL HYSTERECTOMY    . BREAST BIOPSY    . CATARACT EXTRACTION  2018  . CESAREAN SECTION    . DENTAL SURGERY    . EYE SURGERY    . FOOT SURGERY    . HERNIA REPAIR    . KNEE ARTHROSCOPY Left 07/16/2020   Procedure: ARTHROSCOPY KNEE;  Surgeon: [name], MD;  Location: [place];  Service: Orthopedics;  Laterality: Left;  . KNEE CLOSED REDUCTION Left 07/16/2020   Procedure: LEFT KNEE MANIPULATION UNDER ANESTHESIA;  Surgeon: [name], MD;  Location: [place];  Service:  Orthopedics;  Laterality: Left;  . KNEE SURGERY    . TONSILLECTOMY AND ADENOIDECTOMY    . TOTAL KNEE ARTHROPLASTY Left 04/21/2020   Procedure: LEFT TOTAL KNEE ARTHROPLASTY;  Surgeon: [name], MD;  Location: [place];  Service: Orthopedics;  Laterality: Left;    There were no vitals filed for this visit.   Subjective Assessment - 08/06/20 1523    Subjective my knee just hurts, I am trying all i can to bend it at home    Limitations Walking;Standing;House hold activities    Diagnostic tests X-ray    Patient Stated Goals walk without a cane    Pain Onset More than a month ago              [facility] PT Assessment - 08/06/20 0001      Assessment   Medical Diagnosis L TKA    Referring Provider (PT) Rodell Perna, MD    Onset Date/Surgical Date 04/21/20      AROM   Left Knee Extension 5    Left Knee Flexion 60      PROM   Left Knee Extension 3    Left Knee Flexion 70      Strength  Right Knee Flexion 4+/5    Right Knee Extension 4+/5    Left Knee Flexion 4+/5    Left Knee Extension 4+/5                         [facility] Adult PT Treatment/Exercise - 08/06/20 0001      Knee/Hip Exercises: Stretches   Knee: Self-Stretch Limitations supine heelslides 5 sec X 10, standing lunge stretch with foot on step 6 inch 10 reps X 10 sec holds. Seated tailgate flexion 10 sec X 10 reps      Knee/Hip Exercises: Aerobic   Nustep Seat 7 level 6 for 8 minutes with 5 second hold left knee flexion stretch & 5 second press out.       Manual Therapy   Manual therapy comments supine patella mobs, supine knee PROM and flexion mobs then moved to seated knee flexion with RLE LAQ 3x10; contract/relax - best ROM with this movement                     PT Short Term Goals - 08/02/20 1637      PT SHORT TERM GOAL #1   Title Patient demonstrates updated HEP    Status On-going    Target Date 08/13/20      PT SHORT TERM GOAL #2   Title Knee left flexion 80*    Target Date  09/10/20             PT Long Term Goals - 08/02/20 1637      PT LONG TERM GOAL #1   Title Patient will be independent in her HEP and progression.    Baseline inital program issued on 06/10/2020    Time 8    Period Weeks    Status On-going    Target Date 09/10/20      PT LONG TERM GOAL #2   Title Patient able to complete 5 time sit to stand </= 18 seconds to improve balance and functional mobility.    Baseline 25.3 seconds using UE support    Time 8    Period Weeks    Status On-going    Target Date 09/10/20      PT LONG TERM GOAL #3   Title Patient will be able to amb with straigth cane for >/= 500 feet on community level surfaces with step through gait pattern.    Baseline currently using RW walking on househod distances    Time 8    Period Weeks    Status On-going    Target Date 09/10/20      PT LONG TERM GOAL #4   Title Pt will improve her L knee flexion to >/= 90 degrees go improve funtional mobility and gait.    Baseline 50 degrees actively    Time 8    Period Weeks    Status On-going    Target Date 09/10/20                 Plan - 08/06/20 1525    Clinical Impression Statement She continues to struggle with knee flexion ROM despite aggressive stretching and maximal encouragment from PT to stretch more at home. She continues to have poor activity tolerance and high pain levels. She remains very guarded with PROM making it difficult. Showed her other ways to stretch at home so she can be in control .    Rehab Potential Fair    PT Frequency 2x / week  PT Duration 8 weeks    PT Treatment/Interventions Cryotherapy;Electrical Stimulation;ADLs/Self Care Home Management;Ultrasound;Gait training;Stair training;Functional mobility training;Therapeutic activities;Therapeutic exercise;Moist Heat;Balance training;Neuromuscular re-education;Patient/family education;Manual techniques;Scar mobilization;Dry needling;Taping;Vasopneumatic Device;Passive range of motion    PT  Next Visit Plan measure, needs MD note - continue with aggressive ROM as able    PT Home Exercise Plan [email or web address], Access Code: [email or web address]    Consulted and Agree with Plan of Care Patient           Patient will benefit from skilled therapeutic intervention in order to improve the following deficits and impairments:  Pain, Postural dysfunction, Decreased activity tolerance, Decreased strength, Impaired flexibility, Obesity, Decreased range of motion, Increased edema, Difficulty walking, Decreased balance  Visit Diagnosis: Acute pain of left knee  Stiffness of left knee, not elsewhere classified  Difficulty in walking, not elsewhere classified  Localized edema     Problem List Patient Active Problem List   Diagnosis Date Noted  . Arthrofibrosis of knee joint, left 06/24/2020  . S/P total knee arthroplasty, left 06/02/2020  . Noncompliance with CPAP treatment 09/24/2018  . A-fib ([place]) 08/31/2018  . CHF (congestive heart failure) ([place]) 08/31/2018  . Acute pyelonephritis 08/31/2018  . Sepsis secondary to UTI ([place]) 08/31/2018  . Leucocytosis 08/31/2018  . Hypothyroidism 08/31/2018  . CKD (chronic kidney disease) stage 3, GFR 30-59 ml/min (HCC) 08/31/2018  . HTN (hypertension) 08/31/2018  . HLD (hyperlipidemia) 08/31/2018  . Anxiety and depression 08/31/2018  . Obesity 08/31/2018  . Sepsis ([place]) 08/31/2018  . Back pain 08/31/2018  . Obstructive sleep apnea treated with continuous positive airway pressure (CPAP) 08/13/2018    Silvestre Mesi 08/06/2020, 3:29 PM  [facility] Physical Therapy [address] [place], [place], [address] Phone: [phone]   Fax:  [phone]  Name: Jennifer Foley MRN: [id number] Date of Birth: 01/14/1956

## 2020-08-06 NOTE — Progress Notes (Signed)
Patient had 100 degrees at the time of manipulation anesthesia arthroscopy.  She is 70 degrees.  When she is starting to have some pain after a minute or so she gets very anxious and has a hard time relaxing and then she starts having more pain if she tenses up.  We discussed playing relaxing music anything that can help soothe her to help relax some so she does not get so anxious and then she should be able to continue to work on knee flexion.  She made it 4-1/2 minutes today which is 3 minutes longer than she has been able to make it pushing her knee back to flexion and then holding it.  She will continue to work on this and is continuing therapy.  I will recheck her in 2 weeks

## 2020-08-09 ENCOUNTER — Ambulatory Visit (INDEPENDENT_AMBULATORY_CARE_PROVIDER_SITE_OTHER): Payer: Medicare Other | Admitting: Physical Therapy

## 2020-08-09 ENCOUNTER — Other Ambulatory Visit: Payer: Self-pay

## 2020-08-09 ENCOUNTER — Encounter: Payer: Self-pay | Admitting: Physical Therapy

## 2020-08-09 DIAGNOSIS — R600 Localized edema: Secondary | ICD-10-CM

## 2020-08-09 DIAGNOSIS — M25562 Pain in left knee: Secondary | ICD-10-CM | POA: Diagnosis not present

## 2020-08-09 DIAGNOSIS — M25662 Stiffness of left knee, not elsewhere classified: Secondary | ICD-10-CM

## 2020-08-09 DIAGNOSIS — R262 Difficulty in walking, not elsewhere classified: Secondary | ICD-10-CM | POA: Diagnosis not present

## 2020-08-09 NOTE — Therapy (Signed)
[facility] [address] [place], [place], [address] Phone: [phone]   Fax:  [phone]  Physical Therapy Treatment  Patient Details  Name: Jennifer Foley MRN: [id number] Date of Birth: [birth year] Referring Provider (PT): Rodell Perna, MD   Encounter Date: 08/09/2020   PT End of Session - 08/09/20 1359    Visit Number 16    Number of Visits 23    Date for PT Re-Evaluation 09/10/20    PT Start Time 4010    PT Stop Time 1352    PT Time Calculation (min) 44 min    Activity Tolerance Patient tolerated treatment well;Patient limited by pain    Behavior During Therapy [facility] for tasks assessed/performed           Past Medical History:  Diagnosis Date  . Anemia   . Arthritis    per patient, in left and right knee and both hands  . Atrial fibrillation ([place])   . Bilateral breast cysts   . CHF (congestive heart failure) ([place])   . Chronic kidney disease    stage 3  . Depression with anxiety   . DM (diabetes mellitus) ([place])   . Esophageal reflux   . History of hiatal hernia   . HTN (hypertension)   . Hyperlipemia   . Hyperthyroidism   . Hypothyroidism   . INR (international normal ratio) abnormal   . MRSA (methicillin resistant staph aureus) culture positive   . Panuveitis   . Pneumonia   . PTSD (post-traumatic stress disorder)   . Sleep apnea    per patient has CPAP, wears it at night sometimes    Past Surgical History:  Procedure Laterality Date  . ABDOMINAL HYSTERECTOMY    . BREAST BIOPSY    . CATARACT EXTRACTION  2018  . CESAREAN SECTION    . DENTAL SURGERY    . EYE SURGERY    . FOOT SURGERY    . HERNIA REPAIR    . KNEE ARTHROSCOPY Left 07/16/2020   Procedure: ARTHROSCOPY KNEE;  Surgeon: [name], MD;  Location: [place];  Service: Orthopedics;  Laterality: Left;  . KNEE CLOSED REDUCTION Left 07/16/2020   Procedure: LEFT KNEE MANIPULATION UNDER ANESTHESIA;  Surgeon: [name], MD;  Location: [place];  Service:  Orthopedics;  Laterality: Left;  . KNEE SURGERY    . TONSILLECTOMY AND ADENOIDECTOMY    . TOTAL KNEE ARTHROPLASTY Left 04/21/2020   Procedure: LEFT TOTAL KNEE ARTHROPLASTY;  Surgeon: [name], MD;  Location: [place];  Service: Orthopedics;  Laterality: Left;    There were no vitals filed for this visit.   Subjective Assessment - 08/09/20 1312    Subjective doctor wants her to keep working the flexion    Limitations Walking;Standing;House hold activities    Diagnostic tests X-ray    Patient Stated Goals walk without a cane    Currently in Pain? Yes    Pain Score 6     Pain Location Knee    Pain Orientation Left    Pain Descriptors / Indicators Aching;Sore    Pain Type Chronic pain    Pain Onset More than a month ago    Pain Frequency Constant    Aggravating Factors  bending, walking more    Pain Relieving Factors meds, ice, rest                             [facility] Adult PT Treatment/Exercise - 08/09/20  1314      Knee/Hip Exercises: Stretches   Knee: Self-Stretch to increase Flexion Left;5 reps;20 seconds   2 sets   Other Knee/Hip Stretches LLLD stretch sitting Lt knee flexion 3x2 min holds      Knee/Hip Exercises: Aerobic   Nustep Seat 6 level 6 for 8 minutes with 5 second hold left knee flexion stretch & 5 second press out.       Knee/Hip Exercises: Seated   Long Arc Quad Limitations RLE LAQ with 3 sec hold 3x10 for passive Lt knee flexion      Manual Therapy   Manual therapy comments seated knee flexion with LLLD stretch x 5 min                    PT Short Term Goals - 08/09/20 1359      PT SHORT TERM GOAL #1   Title Patient demonstrates updated HEP    Status Achieved    Target Date 08/13/20      PT SHORT TERM GOAL #2   Title Knee left flexion 80*    Status On-going    Target Date 09/10/20             PT Long Term Goals - 08/02/20 1637      PT LONG TERM GOAL #1   Title Patient will be independent in her HEP and progression.     Baseline inital program issued on 06/10/2020    Time 8    Period Weeks    Status On-going    Target Date 09/10/20      PT LONG TERM GOAL #2   Title Patient able to complete 5 time sit to stand </= 18 seconds to improve balance and functional mobility.    Baseline 25.3 seconds using UE support    Time 8    Period Weeks    Status On-going    Target Date 09/10/20      PT LONG TERM GOAL #3   Title Patient will be able to amb with straigth cane for >/= 500 feet on community level surfaces with step through gait pattern.    Baseline currently using RW walking on househod distances    Time 8    Period Weeks    Status On-going    Target Date 09/10/20      PT LONG TERM GOAL #4   Title Pt will improve her L knee flexion to >/= 90 degrees go improve funtional mobility and gait.    Baseline 50 degrees actively    Time 8    Period Weeks    Status On-going    Target Date 09/10/20                 Plan - 08/09/20 1359    Clinical Impression Statement Pt reports improved compliance with HEP meeting STG #1 at this time, and most of session today focused on exercises for pt to do at home independently.  PT still with heavy guarding still with improved awareness and effort to decrease guarding.  Will continue to benefit from PT to maximize function.    Rehab Potential Fair    PT Frequency 2x / week    PT Duration 8 weeks    PT Treatment/Interventions Cryotherapy;Electrical Stimulation;ADLs/Self Care Home Management;Ultrasound;Gait training;Stair training;Functional mobility training;Therapeutic activities;Therapeutic exercise;Moist Heat;Balance training;Neuromuscular re-education;Patient/family education;Manual techniques;Scar mobilization;Dry needling;Taping;Vasopneumatic Device;Passive range of motion    PT Next Visit Plan continue with aggressive ROM as able, manual with focus on things  pt can do independently at home    PT [place], Access Code: [email or web address]     Consulted and Agree with Plan of Care Patient           Patient will benefit from skilled therapeutic intervention in order to improve the following deficits and impairments:  Pain, Postural dysfunction, Decreased activity tolerance, Decreased strength, Impaired flexibility, Obesity, Decreased range of motion, Increased edema, Difficulty walking, Decreased balance  Visit Diagnosis: Acute pain of left knee  Stiffness of left knee, not elsewhere classified  Difficulty in walking, not elsewhere classified  Localized edema     Problem List Patient Active Problem List   Diagnosis Date Noted  . Arthrofibrosis of knee joint, left 06/24/2020  . S/P total knee arthroplasty, left 06/02/2020  . Noncompliance with CPAP treatment 09/24/2018  . A-fib ([place]) 08/31/2018  . CHF (congestive heart failure) ([place]) 08/31/2018  . Acute pyelonephritis 08/31/2018  . Sepsis secondary to UTI ([place]) 08/31/2018  . Leucocytosis 08/31/2018  . Hypothyroidism 08/31/2018  . CKD (chronic kidney disease) stage 3, GFR 30-59 ml/min (HCC) 08/31/2018  . HTN (hypertension) 08/31/2018  . HLD (hyperlipidemia) 08/31/2018  . Anxiety and depression 08/31/2018  . Obesity 08/31/2018  . Sepsis ([place]) 08/31/2018  . Back pain 08/31/2018  . Obstructive sleep apnea treated with continuous positive airway pressure (CPAP) 08/13/2018      Laureen Abrahams, PT, DPT 08/09/20 2:03 PM    [facility] [address] [place], [place], [address] Phone: [phone]   Fax:  [phone]  Name: Neomia Herbel MRN: [id number] Date of Birth: [birth year]

## 2020-08-11 ENCOUNTER — Other Ambulatory Visit: Payer: Self-pay

## 2020-08-11 ENCOUNTER — Ambulatory Visit (INDEPENDENT_AMBULATORY_CARE_PROVIDER_SITE_OTHER): Payer: Medicare Other | Admitting: Physical Therapy

## 2020-08-11 ENCOUNTER — Encounter: Payer: Self-pay | Admitting: Physical Therapy

## 2020-08-11 DIAGNOSIS — M25662 Stiffness of left knee, not elsewhere classified: Secondary | ICD-10-CM | POA: Diagnosis not present

## 2020-08-11 DIAGNOSIS — R600 Localized edema: Secondary | ICD-10-CM | POA: Diagnosis not present

## 2020-08-11 DIAGNOSIS — M25562 Pain in left knee: Secondary | ICD-10-CM | POA: Diagnosis not present

## 2020-08-11 DIAGNOSIS — R262 Difficulty in walking, not elsewhere classified: Secondary | ICD-10-CM | POA: Diagnosis not present

## 2020-08-11 NOTE — Therapy (Signed)
[facility] Therapy [address] [place], [place], [address] Phone: [phone]   Fax:  [phone]  Physical Therapy Treatment  Patient Details  Name: Jennifer Foley MRN: [id number] Date of Birth: Feb 12, 1956 Referring Provider (PT): Rodell Perna, MD   Encounter Date: 08/11/2020   PT End of Session - 08/11/20 1341    Visit Number 17    Number of Visits 23    Date for PT Re-Evaluation 09/10/20    PT Start Time 3151    PT Stop Time 7616    PT Time Calculation (min) 50 min    Activity Tolerance Patient tolerated treatment well;Patient limited by pain    Behavior During Therapy [facility] for tasks assessed/performed           Past Medical History:  Diagnosis Date   Anemia    Arthritis    per patient, in left and right knee and both hands   Atrial fibrillation (HCC)    Bilateral breast cysts    CHF (congestive heart failure) (HCC)    Chronic kidney disease    stage 3   Depression with anxiety    DM (diabetes mellitus) ([place])    Esophageal reflux    History of hiatal hernia    HTN (hypertension)    Hyperlipemia    Hyperthyroidism    Hypothyroidism    INR (international normal ratio) abnormal    MRSA (methicillin resistant staph aureus) culture positive    Panuveitis    Pneumonia    PTSD (post-traumatic stress disorder)    Sleep apnea    per patient has CPAP, wears it at night sometimes    Past Surgical History:  Procedure Laterality Date   ABDOMINAL HYSTERECTOMY     BREAST BIOPSY     CATARACT EXTRACTION  2018   CESAREAN SECTION     DENTAL SURGERY     EYE SURGERY     FOOT SURGERY     HERNIA REPAIR     KNEE ARTHROSCOPY Left 07/16/2020   Procedure: ARTHROSCOPY KNEE;  Surgeon: [name], MD;  Location: [place];  Service: Orthopedics;  Laterality: Left;   KNEE CLOSED REDUCTION Left 07/16/2020   Procedure: LEFT KNEE MANIPULATION UNDER ANESTHESIA;  Surgeon: [name], MD;  Location: [place];  Service:  Orthopedics;  Laterality: Left;   KNEE SURGERY     TONSILLECTOMY AND ADENOIDECTOMY     TOTAL KNEE ARTHROPLASTY Left 04/21/2020   Procedure: LEFT TOTAL KNEE ARTHROPLASTY;  Surgeon: [name], MD;  Location: [place];  Service: Orthopedics;  Laterality: Left;    There were no vitals filed for this visit.   Subjective Assessment - 08/11/20 1309    Subjective trying to bend her knee more    Limitations Walking;Standing;House hold activities    Diagnostic tests X-ray    Patient Stated Goals walk without a cane    Currently in Pain? Yes    Pain Score 6     Pain Location Knee    Pain Orientation Left    Pain Descriptors / Indicators Aching;Sore    Pain Type Chronic pain    Pain Onset More than a month ago    Pain Frequency Constant    Aggravating Factors  bending, walking    Pain Relieving Factors meds, ice, rest                             [facility] Adult PT Treatment/Exercise - 08/11/20 1310  Knee/Hip Exercises: Seated   Other Seated Knee/Hip Exercises tailgate flexion c Rt LE overpressure 10 x 30 sec    Other Seated Knee/Hip Exercises tailgate dangle x 3 min      Manual Therapy   Manual therapy comments seated knee flexion with LLLD stretch x 5 min, seated knee flexion contract/relax and PROM                  PT Education - 08/11/20 1341    Education Details LLLD stretch at home (sitting in chair with foot against wall moving forward every few minutes)            PT Short Term Goals - 08/09/20 1359      PT SHORT TERM GOAL #1   Title Patient demonstrates updated HEP    Status Achieved    Target Date 08/13/20      PT SHORT TERM GOAL #2   Title Knee left flexion 80*    Status On-going    Target Date 09/10/20             PT Long Term Goals - 08/02/20 1637      PT LONG TERM GOAL #1   Title Patient will be independent in her HEP and progression.    Baseline inital program issued on 06/10/2020    Time 8    Period Weeks    Status  On-going    Target Date 09/10/20      PT LONG TERM GOAL #2   Title Patient able to complete 5 time sit to stand </= 18 seconds to improve balance and functional mobility.    Baseline 25.3 seconds using UE support    Time 8    Period Weeks    Status On-going    Target Date 09/10/20      PT LONG TERM GOAL #3   Title Patient will be able to amb with straigth cane for >/= 500 feet on community level surfaces with step through gait pattern.    Baseline currently using RW walking on househod distances    Time 8    Period Weeks    Status On-going    Target Date 09/10/20      PT LONG TERM GOAL #4   Title Pt will improve her L knee flexion to >/= 90 degrees go improve funtional mobility and gait.    Baseline 50 degrees actively    Time 8    Period Weeks    Status On-going    Target Date 09/10/20                 Plan - 08/11/20 1341    Clinical Impression Statement Pt tolerated session well today, and is able to recognize the guarding and mental block limiting progress.  Discussed LLLD stretching for home and pt agreeable to work on.  Will continue to benefit from PT to maximize function.    Rehab Potential Fair    PT Frequency 2x / week    PT Duration 8 weeks    PT Treatment/Interventions Cryotherapy;Electrical Stimulation;ADLs/Self Care Home Management;Ultrasound;Gait training;Stair training;Functional mobility training;Therapeutic activities;Therapeutic exercise;Moist Heat;Balance training;Neuromuscular re-education;Patient/family education;Manual techniques;Scar mobilization;Dry needling;Taping;Vasopneumatic Device;Passive range of motion    PT Next Visit Plan continue with aggressive ROM as able, manual with focus on things pt can do independently at home    PT [place], Access Code: [email or web address]    Consulted and Agree with Plan of Care Patient  Patient will benefit from skilled therapeutic intervention in order to improve the following deficits  and impairments:  Pain, Postural dysfunction, Decreased activity tolerance, Decreased strength, Impaired flexibility, Obesity, Decreased range of motion, Increased edema, Difficulty walking, Decreased balance  Visit Diagnosis: Acute pain of left knee  Stiffness of left knee, not elsewhere classified  Difficulty in walking, not elsewhere classified  Localized edema     Problem List Patient Active Problem List   Diagnosis Date Noted   Arthrofibrosis of knee joint, left 06/24/2020   S/P total knee arthroplasty, left 06/02/2020   Noncompliance with CPAP treatment 09/24/2018   A-fib ([place]) 08/31/2018   CHF (congestive heart failure) ([place]) 08/31/2018   Acute pyelonephritis 08/31/2018   Sepsis secondary to UTI ([place]) 08/31/2018   Leucocytosis 08/31/2018   Hypothyroidism 08/31/2018   CKD (chronic kidney disease) stage 3, GFR 30-59 ml/min ([place]) 08/31/2018   HTN (hypertension) 08/31/2018   HLD (hyperlipidemia) 08/31/2018   Anxiety and depression 08/31/2018   Obesity 08/31/2018   Sepsis ([place]) 08/31/2018   Back pain 08/31/2018   Obstructive sleep apnea treated with continuous positive airway pressure (CPAP) 08/13/2018      Laureen Abrahams, PT, DPT 08/11/20 1:55 PM     [facility] [address] [place], [place], [address] Phone: [phone]   Fax:  [phone]  Name: Jennifer Foley MRN: [id number] Date of Birth: [birth year]

## 2020-08-13 ENCOUNTER — Encounter: Payer: Medicare Other | Admitting: Physical Therapy

## 2020-08-16 ENCOUNTER — Telehealth: Payer: Self-pay | Admitting: Physical Therapy

## 2020-08-16 ENCOUNTER — Encounter: Payer: Medicare Other | Admitting: Physical Therapy

## 2020-08-16 NOTE — Telephone Encounter (Signed)
LVM for pt due to NS for PT appt.  Advised of [facility] policy and requested a call if unable to make appt.  Reminded of next scheduled appt.  Laureen Abrahams, PT, DPT 08/16/20 1:21 PM

## 2020-08-18 ENCOUNTER — Other Ambulatory Visit: Payer: Self-pay

## 2020-08-18 ENCOUNTER — Encounter: Payer: Self-pay | Admitting: Physical Therapy

## 2020-08-18 ENCOUNTER — Ambulatory Visit (INDEPENDENT_AMBULATORY_CARE_PROVIDER_SITE_OTHER): Payer: Medicare Other | Admitting: Physical Therapy

## 2020-08-18 DIAGNOSIS — M25562 Pain in left knee: Secondary | ICD-10-CM

## 2020-08-18 DIAGNOSIS — R600 Localized edema: Secondary | ICD-10-CM | POA: Diagnosis not present

## 2020-08-18 DIAGNOSIS — M25662 Stiffness of left knee, not elsewhere classified: Secondary | ICD-10-CM

## 2020-08-18 DIAGNOSIS — R262 Difficulty in walking, not elsewhere classified: Secondary | ICD-10-CM

## 2020-08-18 NOTE — Therapy (Signed)
[facility] Physical Therapy [address] [place], [place], [address] Phone: [phone]   Fax:  [phone]  Physical Therapy Treatment  Patient Details  Name: Jennifer Foley MRN: [id number] Date of Birth: 12/06/1955 Referring Provider (PT): Rodell Perna, MD   Encounter Date: 08/18/2020   PT End of Session - 08/18/20 1425    Visit Number 18    Number of Visits 23    Date for PT Re-Evaluation 09/10/20    PT Start Time 7026   pt arrived late   PT Stop Time 1425    PT Time Calculation (min) 28 min    Activity Tolerance Patient tolerated treatment well;Patient limited by pain    Behavior During Therapy [facility] for tasks assessed/performed           Past Medical History:  Diagnosis Date  . Anemia   . Arthritis    per patient, in left and right knee and both hands  . Atrial fibrillation ([place])   . Bilateral breast cysts   . CHF (congestive heart failure) ([place])   . Chronic kidney disease    stage 3  . Depression with anxiety   . DM (diabetes mellitus) ([place])   . Esophageal reflux   . History of hiatal hernia   . HTN (hypertension)   . Hyperlipemia   . Hyperthyroidism   . Hypothyroidism   . INR (international normal ratio) abnormal   . MRSA (methicillin resistant staph aureus) culture positive   . Panuveitis   . Pneumonia   . PTSD (post-traumatic stress disorder)   . Sleep apnea    per patient has CPAP, wears it at night sometimes    Past Surgical History:  Procedure Laterality Date  . ABDOMINAL HYSTERECTOMY    . BREAST BIOPSY    . CATARACT EXTRACTION  2018  . CESAREAN SECTION    . DENTAL SURGERY    . EYE SURGERY    . FOOT SURGERY    . HERNIA REPAIR    . KNEE ARTHROSCOPY Left 07/16/2020   Procedure: ARTHROSCOPY KNEE;  Surgeon: [name], MD;  Location: [place];  Service: Orthopedics;  Laterality: Left;  . KNEE CLOSED REDUCTION Left 07/16/2020   Procedure: LEFT KNEE MANIPULATION UNDER ANESTHESIA;  Surgeon: [name], MD;  Location: [place];   Service: Orthopedics;  Laterality: Left;  . KNEE SURGERY    . TONSILLECTOMY AND ADENOIDECTOMY    . TOTAL KNEE ARTHROPLASTY Left 04/21/2020   Procedure: LEFT TOTAL KNEE ARTHROPLASTY;  Surgeon: [name], MD;  Location: [place];  Service: Orthopedics;  Laterality: Left;    There were no vitals filed for this visit.   Subjective Assessment - 08/18/20 1358    Subjective has been out of town for a week - feels knee is bending more    Limitations Walking;Standing;House hold activities    Diagnostic tests X-ray    Patient Stated Goals walk without a cane    Currently in Pain? Yes    Pain Score 5     Pain Location Knee    Pain Orientation Left    Pain Descriptors / Indicators Aching;Sore    Pain Type Chronic pain    Pain Onset More than a month ago    Pain Frequency Constant    Aggravating Factors  bending, walking    Pain Relieving Factors meds, ice, rest              [facility] PT Assessment - 08/18/20 1419      Assessment  Medical Diagnosis L TKA    Referring Provider (PT) Rodell Perna, MD    Onset Date/Surgical Date 04/21/20    Next MD Visit 08/20/20      AROM   Left Knee Flexion 68      PROM   Left Knee Flexion 73                         [facility] Adult PT Treatment/Exercise - 08/18/20 1400      Knee/Hip Exercises: Aerobic   Nustep Seat 6 level 6 for 8 minutes with 5 second hold left knee flexion stretch & 5 second press out.  - increased time due to holds      Manual Therapy   Manual therapy comments seated knee flexion with LLLD stretch x 5 min, seated knee flexion contract/relax and PROM                    PT Short Term Goals - 08/09/20 1359      PT SHORT TERM GOAL #1   Title Patient demonstrates updated HEP    Status Achieved    Target Date 08/13/20      PT SHORT TERM GOAL #2   Title Knee left flexion 80*    Status On-going    Target Date 09/10/20             PT Long Term Goals - 08/02/20 1637      PT LONG TERM GOAL #1    Title Patient will be independent in her HEP and progression.    Baseline inital program issued on 06/10/2020    Time 8    Period Weeks    Status On-going    Target Date 09/10/20      PT LONG TERM GOAL #2   Title Patient able to complete 5 time sit to stand </= 18 seconds to improve balance and functional mobility.    Baseline 25.3 seconds using UE support    Time 8    Period Weeks    Status On-going    Target Date 09/10/20      PT LONG TERM GOAL #3   Title Patient will be able to amb with straigth cane for >/= 500 feet on community level surfaces with step through gait pattern.    Baseline currently using RW walking on househod distances    Time 8    Period Weeks    Status On-going    Target Date 09/10/20      PT LONG TERM GOAL #4   Title Pt will improve her L knee flexion to >/= 90 degrees go improve funtional mobility and gait.    Baseline 50 degrees actively    Time 8    Period Weeks    Status On-going    Target Date 09/10/20                 Plan - 08/18/20 1425    Clinical Impression Statement Pt with small improvements noted in A/P Lt knee flexion today, and reinforced need for continuous work at home.  Will see MD after next visit to determine next steps.    Rehab Potential Fair    PT Frequency 2x / week    PT Duration 8 weeks    PT Treatment/Interventions Cryotherapy;Electrical Stimulation;ADLs/Self Care Home Management;Ultrasound;Gait training;Stair training;Functional mobility training;Therapeutic activities;Therapeutic exercise;Moist Heat;Balance training;Neuromuscular re-education;Patient/family education;Manual techniques;Scar mobilization;Dry needling;Taping;Vasopneumatic Device;Passive range of motion    PT Next Visit Plan continue with  aggressive ROM as able, manual with focus on things pt can do independently at home, MD note    PT [place], Access Code: [email or web address]    Consulted and Agree with Plan of Care Patient           Patient  will benefit from skilled therapeutic intervention in order to improve the following deficits and impairments:  Pain, Postural dysfunction, Decreased activity tolerance, Decreased strength, Impaired flexibility, Obesity, Decreased range of motion, Increased edema, Difficulty walking, Decreased balance  Visit Diagnosis: Acute pain of left knee  Stiffness of left knee, not elsewhere classified  Difficulty in walking, not elsewhere classified  Localized edema     Problem List Patient Active Problem List   Diagnosis Date Noted  . Arthrofibrosis of knee joint, left 06/24/2020  . S/P total knee arthroplasty, left 06/02/2020  . Noncompliance with CPAP treatment 09/24/2018  . A-fib ([place]) 08/31/2018  . CHF (congestive heart failure) ([place]) 08/31/2018  . Acute pyelonephritis 08/31/2018  . Sepsis secondary to UTI ([place]) 08/31/2018  . Leucocytosis 08/31/2018  . Hypothyroidism 08/31/2018  . CKD (chronic kidney disease) stage 3, GFR 30-59 ml/min (HCC) 08/31/2018  . HTN (hypertension) 08/31/2018  . HLD (hyperlipidemia) 08/31/2018  . Anxiety and depression 08/31/2018  . Obesity 08/31/2018  . Sepsis ([place]) 08/31/2018  . Back pain 08/31/2018  . Obstructive sleep apnea treated with continuous positive airway pressure (CPAP) 08/13/2018      Laureen Abrahams, PT, DPT 08/18/20 2:27 PM     [facility] [address] [place], [place], [address] Phone: [phone]   Fax:  [phone]  Name: Jennifer Foley MRN: [id number] Date of Birth: 1956/05/24

## 2020-08-20 ENCOUNTER — Ambulatory Visit (INDEPENDENT_AMBULATORY_CARE_PROVIDER_SITE_OTHER): Payer: Medicare Other | Admitting: Physical Therapy

## 2020-08-20 ENCOUNTER — Encounter: Payer: Self-pay | Admitting: Orthopaedic Surgery

## 2020-08-20 ENCOUNTER — Ambulatory Visit (INDEPENDENT_AMBULATORY_CARE_PROVIDER_SITE_OTHER): Payer: Medicare Other | Admitting: Orthopaedic Surgery

## 2020-08-20 ENCOUNTER — Encounter: Payer: Self-pay | Admitting: Physical Therapy

## 2020-08-20 ENCOUNTER — Other Ambulatory Visit: Payer: Self-pay

## 2020-08-20 VITALS — Ht 60.0 in | Wt 202.0 lb

## 2020-08-20 DIAGNOSIS — M25562 Pain in left knee: Secondary | ICD-10-CM

## 2020-08-20 DIAGNOSIS — R600 Localized edema: Secondary | ICD-10-CM

## 2020-08-20 DIAGNOSIS — M24662 Ankylosis, left knee: Secondary | ICD-10-CM

## 2020-08-20 DIAGNOSIS — R262 Difficulty in walking, not elsewhere classified: Secondary | ICD-10-CM

## 2020-08-20 DIAGNOSIS — M25662 Stiffness of left knee, not elsewhere classified: Secondary | ICD-10-CM | POA: Diagnosis not present

## 2020-08-20 NOTE — Addendum Note (Signed)
Addended by: Meyer Cory on: 08/20/2020 03:05 PM   Modules accepted: Orders

## 2020-08-20 NOTE — Therapy (Signed)
[facility] Therapy [address] [place], [place], [address] Phone: [phone]   Fax:  [phone]  Physical Therapy Treatment  Patient Details  Name: Jennifer Foley MRN: [id number] Date of Birth: [birth year] Referring Provider (PT): Rodell Perna, MD   Encounter Date: 08/20/2020   PT End of Session - 08/20/20 1144    Visit Number 19    Number of Visits 23    Date for PT Re-Evaluation 09/10/20    PT Start Time 1120   pt arrived late - needed to eat prior   PT Stop Time 1144    PT Time Calculation (min) 24 min    Activity Tolerance Patient tolerated treatment well;Patient limited by pain    Behavior During Therapy [facility] for tasks assessed/performed           Past Medical History:  Diagnosis Date  . Anemia   . Arthritis    per patient, in left and right knee and both hands  . Atrial fibrillation ([place])   . Bilateral breast cysts   . CHF (congestive heart failure) ([place])   . Chronic kidney disease    stage 3  . Depression with anxiety   . DM (diabetes mellitus) ([place])   . Esophageal reflux   . History of hiatal hernia   . HTN (hypertension)   . Hyperlipemia   . Hyperthyroidism   . Hypothyroidism   . INR (international normal ratio) abnormal   . MRSA (methicillin resistant staph aureus) culture positive   . Panuveitis   . Pneumonia   . PTSD (post-traumatic stress disorder)   . Sleep apnea    per patient has CPAP, wears it at night sometimes    Past Surgical History:  Procedure Laterality Date  . ABDOMINAL HYSTERECTOMY    . BREAST BIOPSY    . CATARACT EXTRACTION  2018  . CESAREAN SECTION    . DENTAL SURGERY    . EYE SURGERY    . FOOT SURGERY    . HERNIA REPAIR    . KNEE ARTHROSCOPY Left 07/16/2020   Procedure: ARTHROSCOPY KNEE;  Surgeon: [name], MD;  Location: [place];  Service: Orthopedics;  Laterality: Left;  . KNEE CLOSED REDUCTION Left 07/16/2020   Procedure: LEFT KNEE MANIPULATION UNDER ANESTHESIA;  Surgeon: [name],  MD;  Location: [place];  Service: Orthopedics;  Laterality: Left;  . KNEE SURGERY    . TONSILLECTOMY AND ADENOIDECTOMY    . TOTAL KNEE ARTHROPLASTY Left 04/21/2020   Procedure: LEFT TOTAL KNEE ARTHROPLASTY;  Surgeon: [name], MD;  Location: [place];  Service: Orthopedics;  Laterality: Left;    There were no vitals filed for this visit.   Subjective Assessment - 08/20/20 1114    Subjective not feeling very well today - c/o low energy.  reports she hasn't eaten and didn't use her CPAP.    Limitations Walking;Standing;House hold activities    Diagnostic tests X-ray    Patient Stated Goals walk without a cane    Currently in Pain? Yes    Pain Score 8     Pain Location Knee    Pain Orientation Left    Pain Descriptors / Indicators Aching;Sore    Pain Type Chronic pain    Pain Onset More than a month ago    Pain Frequency Constant    Aggravating Factors  bending, walking    Pain Relieving Factors meds, ice, rest              [facility] PT  Assessment - 08/20/20 0001      Assessment   Medical Diagnosis L TKA    Referring Provider (PT) Rodell Perna, MD    Onset Date/Surgical Date 04/21/20      AROM   Left Knee Extension -4    Left Knee Flexion 68      PROM   Left Knee Extension -2    Left Knee Flexion 75                         [facility] Adult PT Treatment/Exercise - 08/20/20 1115      Knee/Hip Exercises: Stretches   Other Knee/Hip Stretches tailgate knee flexion using RLE to flex LLE.  Contract relax to increase knee flexion.       Knee/Hip Exercises: Aerobic   Nustep Seat 6 level 6 for 5 minutes with 5 second hold left knee flexion stretch & 5 second press out.  - increased time due to holds      Manual Therapy   Manual therapy comments Seated knee flexion, supine knee extension                    PT Short Term Goals - 08/09/20 1359      PT SHORT TERM GOAL #1   Title Patient demonstrates updated HEP    Status Achieved    Target Date 08/13/20       PT SHORT TERM GOAL #2   Title Knee left flexion 80*    Status On-going    Target Date 09/10/20             PT Long Term Goals - 08/02/20 1637      PT LONG TERM GOAL #1   Title Patient will be independent in her HEP and progression.    Baseline inital program issued on 06/10/2020    Time 8    Period Weeks    Status On-going    Target Date 09/10/20      PT LONG TERM GOAL #2   Title Patient able to complete 5 time sit to stand </= 18 seconds to improve balance and functional mobility.    Baseline 25.3 seconds using UE support    Time 8    Period Weeks    Status On-going    Target Date 09/10/20      PT LONG TERM GOAL #3   Title Patient will be able to amb with straigth cane for >/= 500 feet on community level surfaces with step through gait pattern.    Baseline currently using RW walking on househod distances    Time 8    Period Weeks    Status On-going    Target Date 09/10/20      PT LONG TERM GOAL #4   Title Pt will improve her L knee flexion to >/= 90 degrees go improve funtional mobility and gait.    Baseline 50 degrees actively    Time 8    Period Weeks    Status On-going    Target Date 09/10/20                 Plan - 08/20/20 1144    Clinical Impression Statement Slight improvements noted in flexion at this time, with continued education on need for increased activity at home.  To MD today for follow up.    Rehab Potential Fair    PT Frequency 2x / week    PT Duration 8 weeks  PT Treatment/Interventions Cryotherapy;Electrical Stimulation;ADLs/Self Care Home Management;Ultrasound;Gait training;Stair training;Functional mobility training;Therapeutic activities;Therapeutic exercise;Moist Heat;Balance training;Neuromuscular re-education;Patient/family education;Manual techniques;Scar mobilization;Dry needling;Taping;Vasopneumatic Device;Passive range of motion    PT Next Visit Plan continue with aggressive ROM as able, manual with focus on things pt can do  independently at home    PT [place], Access Code: [email or web address]    Consulted and Agree with Plan of Care Patient           Patient will benefit from skilled therapeutic intervention in order to improve the following deficits and impairments:  Pain, Postural dysfunction, Decreased activity tolerance, Decreased strength, Impaired flexibility, Obesity, Decreased range of motion, Increased edema, Difficulty walking, Decreased balance  Visit Diagnosis: Acute pain of left knee  Stiffness of left knee, not elsewhere classified  Difficulty in walking, not elsewhere classified  Localized edema     Problem List Patient Active Problem List   Diagnosis Date Noted  . Arthrofibrosis of knee joint, left 06/24/2020  . S/P total knee arthroplasty, left 06/02/2020  . Noncompliance with CPAP treatment 09/24/2018  . A-fib ([place]) 08/31/2018  . CHF (congestive heart failure) ([place]) 08/31/2018  . Acute pyelonephritis 08/31/2018  . Sepsis secondary to UTI ([place]) 08/31/2018  . Leucocytosis 08/31/2018  . Hypothyroidism 08/31/2018  . CKD (chronic kidney disease) stage 3, GFR 30-59 ml/min (HCC) 08/31/2018  . HTN (hypertension) 08/31/2018  . HLD (hyperlipidemia) 08/31/2018  . Anxiety and depression 08/31/2018  . Obesity 08/31/2018  . Sepsis ([place]) 08/31/2018  . Back pain 08/31/2018  . Obstructive sleep apnea treated with continuous positive airway pressure (CPAP) 08/13/2018      Laureen Abrahams, PT, DPT 08/20/20 11:47 AM    [facility] [address] [place], [place], [address] Phone: [phone]   Fax:  [phone]  Name: Allyce Bochicchio MRN: [id number] Date of Birth: 1956-02-09

## 2020-08-20 NOTE — Progress Notes (Signed)
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  Chief Complaint:  Chief Complaint  Patient presents with   Left Knee - Follow-up, Routine Post Op    07/16/2020 Left knee MUA, left knee arthroscopy   Visit Diagnoses:  1. Arthrofibrosis of knee joint, left     Plan: Preop flexion release 50 degrees.  Now she is at 75 degrees.  She needs to continue therapy I plan to recheck her in 4 weeks.  Follow-Up Instructions: No follow-ups on file.   Orders:  No orders of the defined types were placed in this encounter.  No orders of the defined types were placed in this encounter.   Imaging: No results found.  PMFS History: Patient Active Problem List   Diagnosis Date Noted   Arthrofibrosis of knee joint, left 06/24/2020   S/P total knee arthroplasty, left 06/02/2020   Noncompliance with CPAP treatment 09/24/2018   A-fib ([place]) 08/31/2018   CHF (congestive heart failure) ([place]) 08/31/2018   Acute pyelonephritis 08/31/2018   Sepsis secondary to UTI ([place]) 08/31/2018   Leucocytosis 08/31/2018   Hypothyroidism 08/31/2018   CKD (chronic kidney disease) stage 3, GFR 30-59 ml/min ([place]) 08/31/2018   HTN (hypertension) 08/31/2018   HLD (hyperlipidemia) 08/31/2018   Anxiety and depression 08/31/2018   Obesity 08/31/2018   Sepsis ([place]) 08/31/2018   Back pain 08/31/2018   Obstructive sleep apnea treated with continuous positive airway pressure (CPAP) 08/13/2018   Past Medical History:  Diagnosis Date   Anemia    Arthritis    per patient, in left and right knee and both hands   Atrial fibrillation (HCC)    Bilateral breast cysts    CHF (congestive heart failure) ([place])    Chronic kidney disease    stage 3   Depression with anxiety    DM (diabetes mellitus) (HCC)     Esophageal reflux    History of hiatal hernia    HTN (hypertension)    Hyperlipemia    Hyperthyroidism    Hypothyroidism    INR (international normal ratio) abnormal    MRSA (methicillin resistant staph aureus) culture positive    Panuveitis    Pneumonia    PTSD (post-traumatic stress disorder)    Sleep apnea    per patient has CPAP, wears it at night sometimes    Family History  Problem Relation Age of Onset   Alcoholism Father    Arthritis Father    Hypertension Father        sister, mother   Diabetes Mellitus I Mother        sister   Renal Disease Mother    Stroke Mother    Epilepsy Brother     Past Surgical History:  Procedure Laterality Date   ABDOMINAL HYSTERECTOMY     BREAST BIOPSY     CATARACT EXTRACTION  2018   CESAREAN SECTION     DENTAL SURGERY     EYE SURGERY     FOOT SURGERY     HERNIA REPAIR     KNEE ARTHROSCOPY Left 07/16/2020   Procedure: ARTHROSCOPY KNEE;  Surgeon: [name], MD;  Location: [place];  Service: Orthopedics;  Laterality: Left;   KNEE CLOSED REDUCTION Left 07/16/2020   Procedure:  LEFT KNEE MANIPULATION UNDER ANESTHESIA;  Surgeon: [name], MD;  Location: [place];  Service: Orthopedics;  Laterality: Left;   KNEE SURGERY     TONSILLECTOMY AND ADENOIDECTOMY     TOTAL KNEE ARTHROPLASTY Left 04/21/2020   Procedure: LEFT TOTAL KNEE ARTHROPLASTY;  Surgeon: [name], MD;  Location: [place];  Service: Orthopedics;  Laterality: Left;   Social History   Occupational History   Not on file  Tobacco Use   Smoking status: Former Smoker    Packs/day: 0.25    Years: 34.00    Pack years: 8.50    Types: Cigarettes    Quit date: 12/15/2003    Years since quitting: 16.6   Smokeless tobacco: Former Systems developer    Types: Snuff    Quit date: 03/2020   Tobacco comment: 04/15/2020: per patient 1-2 times a day, trying to stop taking it  Vaping Use   Vaping Use: Never used  Substance and Sexual Activity   Alcohol use:  Not Currently   Drug use: Never   Sexual activity: Not on file

## 2020-08-23 ENCOUNTER — Encounter: Payer: Medicare Other | Admitting: Rehabilitative and Restorative Service Providers"

## 2020-08-23 ENCOUNTER — Telehealth: Payer: Self-pay

## 2020-08-23 NOTE — Telephone Encounter (Signed)
Please advise 

## 2020-08-23 NOTE — Telephone Encounter (Signed)
It is likely still helping prevent swelling and helping her with her ROM. Would stay with it a few more weeks unless she is having no swelling at all . [place] thanks

## 2020-08-23 NOTE — Telephone Encounter (Signed)
Patient called she wants to know if she has to continue to wear the stocking on her leg. call back:[phone].

## 2020-08-23 NOTE — Telephone Encounter (Signed)
I called patient and advised. 

## 2020-08-24 ENCOUNTER — Other Ambulatory Visit: Payer: Self-pay

## 2020-08-24 ENCOUNTER — Ambulatory Visit (INDEPENDENT_AMBULATORY_CARE_PROVIDER_SITE_OTHER): Payer: Medicare Other | Admitting: Physical Therapy

## 2020-08-24 ENCOUNTER — Encounter: Payer: Self-pay | Admitting: Physical Therapy

## 2020-08-24 DIAGNOSIS — R600 Localized edema: Secondary | ICD-10-CM

## 2020-08-24 DIAGNOSIS — R262 Difficulty in walking, not elsewhere classified: Secondary | ICD-10-CM

## 2020-08-24 DIAGNOSIS — M25662 Stiffness of left knee, not elsewhere classified: Secondary | ICD-10-CM

## 2020-08-24 DIAGNOSIS — M25562 Pain in left knee: Secondary | ICD-10-CM

## 2020-08-25 ENCOUNTER — Other Ambulatory Visit: Payer: Self-pay | Admitting: Orthopaedic Surgery

## 2020-08-25 ENCOUNTER — Telehealth: Payer: Self-pay | Admitting: Orthopaedic Surgery

## 2020-08-25 MED ORDER — HYDROCODONE-ACETAMINOPHEN 5-325 MG PO TABS
1.0000 | ORAL_TABLET | Freq: Four times a day (QID) | ORAL | 0 refills | Status: DC | PRN
Start: 2020-08-25 — End: 2022-04-14

## 2020-08-25 NOTE — Therapy (Signed)
[facility] Physical Therapy [address] [place], [place], [address] Phone: [phone]   Fax:  [phone]  Physical Therapy Treatment  Patient Details  Name: Jennifer Foley MRN: [id number] Date of Birth: [birth year] Referring Provider (PT): Rodell Perna, MD   Encounter Date: 08/24/2020   PT End of Session - 08/24/20 1434    Visit Number 20    Number of Visits 23    Date for PT Re-Evaluation 09/10/20    PT Start Time 1430    PT Stop Time 1515    PT Time Calculation (min) 45 min    Activity Tolerance Patient tolerated treatment well;Patient limited by pain    Behavior During Therapy [facility] for tasks assessed/performed           Past Medical History:  Diagnosis Date  . Anemia   . Arthritis    per patient, in left and right knee and both hands  . Atrial fibrillation ([place])   . Bilateral breast cysts   . CHF (congestive heart failure) ([place])   . Chronic kidney disease    stage 3  . Depression with anxiety   . DM (diabetes mellitus) ([place])   . Esophageal reflux   . History of hiatal hernia   . HTN (hypertension)   . Hyperlipemia   . Hyperthyroidism   . Hypothyroidism   . INR (international normal ratio) abnormal   . MRSA (methicillin resistant staph aureus) culture positive   . Panuveitis   . Pneumonia   . PTSD (post-traumatic stress disorder)   . Sleep apnea    per patient has CPAP, wears it at night sometimes    Past Surgical History:  Procedure Laterality Date  . ABDOMINAL HYSTERECTOMY    . BREAST BIOPSY    . CATARACT EXTRACTION  2018  . CESAREAN SECTION    . DENTAL SURGERY    . EYE SURGERY    . FOOT SURGERY    . HERNIA REPAIR    . KNEE ARTHROSCOPY Left 07/16/2020   Procedure: ARTHROSCOPY KNEE;  Surgeon: [name], MD;  Location: [place];  Service: Orthopedics;  Laterality: Left;  . KNEE CLOSED REDUCTION Left 07/16/2020   Procedure: LEFT KNEE MANIPULATION UNDER ANESTHESIA;  Surgeon: [name], MD;  Location: [place];  Service:  Orthopedics;  Laterality: Left;  . KNEE SURGERY    . TONSILLECTOMY AND ADENOIDECTOMY    . TOTAL KNEE ARTHROPLASTY Left 04/21/2020   Procedure: LEFT TOTAL KNEE ARTHROPLASTY;  Surgeon: [name], MD;  Location: [place];  Service: Orthopedics;  Laterality: Left;    There were no vitals filed for this visit.   Subjective Assessment - 08/24/20 1430    Subjective Pt reports that Dr. Lorin Mercy said edema is better but needs to continue PT to improve range.    Limitations Walking;Standing;House hold activities    Diagnostic tests X-ray    Patient Stated Goals walk without a cane    Currently in Pain? Yes    Pain Score 5     Pain Location Knee    Pain Orientation Left    Pain Descriptors / Indicators Aching;Sore    Pain Type Chronic pain    Pain Onset More than a month ago    Aggravating Factors  bending knee    Pain Relieving Factors meds & ice              [facility] PT Assessment - 08/24/20 1430      Assessment   Medical Diagnosis L TKA  Referring Provider (PT) Rodell Perna, MD    Onset Date/Surgical Date 04/21/20      PROM   Left Knee Flexion 80   sitting after manual therapy                        [facility] Adult PT Treatment/Exercise - 08/24/20 1430      Knee/Hip Exercises: Stretches   Gastroc Stretch 2 reps;20 seconds    Gastroc Stretch Limitations slant board    Other Knee/Hip Stretches LLE on 8" step, wt shift forward flexing knee, 20 sec hold 5 reps.     Other Knee/Hip Stretches tailgate knee flexion using RLE to flex LLE.  Contract relax to increase knee flexion.       Knee/Hip Exercises: Aerobic   Nustep Seat 6 level 6 for 6 minutes with 5 second hold left knee flexion stretch & 5 second press out.      Knee/Hip Exercises: Standing   Knee Flexion AROM;Left;2 sets;10 reps    Knee Flexion Limitations stepping over 4" obstacle    Step Down Left;2 sets;10 reps;Hand Hold: 2;Step Height: 6"    Step Down Limitations demo & verbal cues on knee flexion motion        Knee/Hip Exercises: Seated   Sit to Sand 2 sets;10 reps;with UE support   1st set BUEs 2nd set LUE, toes to wall to limit ext knee     Manual Therapy   Manual therapy comments Seated knee flexion, supine knee extension    Soft tissue mobilization Instrument assisted with Hypervolt and manual soft tissue mob to quads with passive quad stretch in sitting.     Muscle [employer] Technique contract relax with agonist & atagonist for increased knee flex & ext                    PT Short Term Goals - 08/25/20 0718      PT SHORT TERM GOAL #1   Title Patient demonstrates updated HEP    Status Achieved    Target Date 08/13/20      PT SHORT TERM GOAL #2   Title Knee left flexion 80*    Baseline MET 08/24/2020    Status Achieved    Target Date 09/10/20             PT Long Term Goals - 08/02/20 1637      PT LONG TERM GOAL #1   Title Patient will be independent in her HEP and progression.    Baseline inital program issued on 06/10/2020    Time 8    Period Weeks    Status On-going    Target Date 09/10/20      PT LONG TERM GOAL #2   Title Patient able to complete 5 time sit to stand </= 18 seconds to improve balance and functional mobility.    Baseline 25.3 seconds using UE support    Time 8    Period Weeks    Status On-going    Target Date 09/10/20      PT LONG TERM GOAL #3   Title Patient will be able to amb with straigth cane for >/= 500 feet on community level surfaces with step through gait pattern.    Baseline currently using RW walking on househod distances    Time 8    Period Weeks    Status On-going    Target Date 09/10/20      PT LONG TERM GOAL #4  Title Pt will improve her L knee flexion to >/= 90 degrees go improve funtional mobility and gait.    Baseline 50 degrees actively    Time 8    Period Weeks    Status On-going    Target Date 09/10/20                 Plan - 08/24/20 1430    Clinical Impression Statement Patient met STG of knee  flexion to 80*.  PT introduced new activities of sitting with toes against wall to limit extending knee to avoid flexing her knee and step downs flexing knee.    Rehab Potential Fair    PT Frequency 2x / week    PT Duration 8 weeks    PT Treatment/Interventions Cryotherapy;Electrical Stimulation;ADLs/Self Care Home Management;Ultrasound;Gait training;Stair training;Functional mobility training;Therapeutic activities;Therapeutic exercise;Moist Heat;Balance training;Neuromuscular re-education;Patient/family education;Manual techniques;Scar mobilization;Dry needling;Taping;Vasopneumatic Device;Passive range of motion    PT Next Visit Plan continue with aggressive ROM as able, manual with focus on things pt can do independently at home    PT [place], Access Code: [email or web address]    Consulted and Agree with Plan of Care Patient           Patient will benefit from skilled therapeutic intervention in order to improve the following deficits and impairments:  Pain, Postural dysfunction, Decreased activity tolerance, Decreased strength, Impaired flexibility, Obesity, Decreased range of motion, Increased edema, Difficulty walking, Decreased balance  Visit Diagnosis: Acute pain of left knee  Stiffness of left knee, not elsewhere classified  Difficulty in walking, not elsewhere classified  Localized edema     Problem List Patient Active Problem List   Diagnosis Date Noted  . Arthrofibrosis of knee joint, left 06/24/2020  . S/P total knee arthroplasty, left 06/02/2020  . Noncompliance with CPAP treatment 09/24/2018  . A-fib ([place]) 08/31/2018  . CHF (congestive heart failure) ([place]) 08/31/2018  . Acute pyelonephritis 08/31/2018  . Sepsis secondary to UTI ([place]) 08/31/2018  . Leucocytosis 08/31/2018  . Hypothyroidism 08/31/2018  . CKD (chronic kidney disease) stage 3, GFR 30-59 ml/min (HCC) 08/31/2018  . HTN (hypertension) 08/31/2018  . HLD (hyperlipidemia) 08/31/2018  . Anxiety  and depression 08/31/2018  . Obesity 08/31/2018  . Sepsis ([place]) 08/31/2018  . Back pain 08/31/2018  . Obstructive sleep apnea treated with continuous positive airway pressure (CPAP) 08/13/2018    Jamey Reas, PT, DPT 08/25/2020, 7:22 AM  [facility] Physical Therapy [address] [place], [place], [address] Phone: [phone]   Fax:  [phone]  Name: Jennifer Foley MRN: [id number] Date of Birth: 06/10/1956

## 2020-08-25 NOTE — Telephone Encounter (Signed)
Please advise 

## 2020-08-25 NOTE — Telephone Encounter (Signed)
Patient called requesting a refill of hydrocodone. Please send to pharmacy on file. Patient phone number is [phone].

## 2020-08-25 NOTE — Telephone Encounter (Signed)
noted 

## 2020-08-25 NOTE — Telephone Encounter (Signed)
FYI done and I called pt and let her know I had sent it in. She is at 82 degrees.

## 2020-08-26 ENCOUNTER — Other Ambulatory Visit: Payer: Self-pay

## 2020-08-26 ENCOUNTER — Encounter: Payer: Self-pay | Admitting: Physical Therapy

## 2020-08-26 ENCOUNTER — Ambulatory Visit (INDEPENDENT_AMBULATORY_CARE_PROVIDER_SITE_OTHER): Payer: Medicare Other | Admitting: Physical Therapy

## 2020-08-26 DIAGNOSIS — M25562 Pain in left knee: Secondary | ICD-10-CM | POA: Diagnosis not present

## 2020-08-26 DIAGNOSIS — R600 Localized edema: Secondary | ICD-10-CM

## 2020-08-26 DIAGNOSIS — M25662 Stiffness of left knee, not elsewhere classified: Secondary | ICD-10-CM

## 2020-08-26 DIAGNOSIS — R262 Difficulty in walking, not elsewhere classified: Secondary | ICD-10-CM | POA: Diagnosis not present

## 2020-08-26 NOTE — Therapy (Signed)
[facility] Physical Therapy [address] [place], [place], [address] Phone: [phone]   Fax:  [phone]  Physical Therapy Treatment  Patient Details  Name: Jennifer Foley MRN: [id number] Date of Birth: 11/04/1955 Referring Provider (PT): Rodell Perna, MD   Encounter Date: 08/26/2020   PT End of Session - 08/26/20 1204    Visit Number 21    Number of Visits 23    Date for PT Re-Evaluation 09/10/20    PT Start Time 5670    PT Stop Time 1410    PT Time Calculation (min) 36 min    Activity Tolerance Patient tolerated treatment well;Patient limited by pain    Behavior During Therapy [facility] for tasks assessed/performed           Past Medical History:  Diagnosis Date  . Anemia   . Arthritis    per patient, in left and right knee and both hands  . Atrial fibrillation ([place])   . Bilateral breast cysts   . CHF (congestive heart failure) ([place])   . Chronic kidney disease    stage 3  . Depression with anxiety   . DM (diabetes mellitus) ([place])   . Esophageal reflux   . History of hiatal hernia   . HTN (hypertension)   . Hyperlipemia   . Hyperthyroidism   . Hypothyroidism   . INR (international normal ratio) abnormal   . MRSA (methicillin resistant staph aureus) culture positive   . Panuveitis   . Pneumonia   . PTSD (post-traumatic stress disorder)   . Sleep apnea    per patient has CPAP, wears it at night sometimes    Past Surgical History:  Procedure Laterality Date  . ABDOMINAL HYSTERECTOMY    . BREAST BIOPSY    . CATARACT EXTRACTION  2018  . CESAREAN SECTION    . DENTAL SURGERY    . EYE SURGERY    . FOOT SURGERY    . HERNIA REPAIR    . KNEE ARTHROSCOPY Left 07/16/2020   Procedure: ARTHROSCOPY KNEE;  Surgeon: [name], MD;  Location: [place];  Service: Orthopedics;  Laterality: Left;  . KNEE CLOSED REDUCTION Left 07/16/2020   Procedure: LEFT KNEE MANIPULATION UNDER ANESTHESIA;  Surgeon: [name], MD;  Location: [place];  Service:  Orthopedics;  Laterality: Left;  . KNEE SURGERY    . TONSILLECTOMY AND ADENOIDECTOMY    . TOTAL KNEE ARTHROPLASTY Left 04/21/2020   Procedure: LEFT TOTAL KNEE ARTHROPLASTY;  Surgeon: [name], MD;  Location: [place];  Service: Orthopedics;  Laterality: Left;    There were no vitals filed for this visit.   Subjective Assessment - 08/26/20 1205    Subjective She did some exercises at home but not new stand to sit with toes against wall.    Limitations Walking;Standing;House hold activities    Diagnostic tests X-ray    Patient Stated Goals walk without a cane    Currently in Pain? Yes    Pain Score 5    7/10 with exercises   Pain Location Knee    Pain Orientation Left    Pain Descriptors / Indicators Tightness;Aching;Sore    Pain Type Chronic pain    Pain Onset More than a month ago    Pain Frequency Constant    Aggravating Factors  bending knee    Pain Relieving Factors meds, ice & elevation              [facility] PT Assessment - 08/26/20 1159  PROM   Left Knee Flexion 83   after manual therapy                        [facility] Adult PT Treatment/Exercise - 08/26/20 1159      Ambulation/Gait   Ambulation/Gait Yes    Ambulation/Gait Assistance 5: Supervision    Ambulation/Gait Assistance Details PT demo & verbal cues on proper step width / not abducting LLE & rotating right heel under midline & not circumducting.   pt return demo with PT verbal cues.     Assistive device Small based quad cane    Ambulation Surface Level;Indoor      Knee/Hip Exercises: Stretches   Production manager Limitations --    Other Knee/Hip Stretches --    Other Knee/Hip Stretches --      Knee/Hip Exercises: Aerobic   Nustep Seat 6 level 6 for 6 minutes with 5 second hold left knee flexion stretch & 5 second press out.      Knee/Hip Exercises: Standing   Knee Flexion --    Knee Flexion Limitations --    Step Down --    Step Down Limitations --       Knee/Hip Exercises: Seated   Sit to Sand 2 sets;10 reps;with UE support   1st set BUEs 2nd set LUE, toes to wall to limit ext knee     Vasopneumatic   Number Minutes Vasopneumatic  10 minutes    Vasopnuematic Location  Knee   intermittent in TKE   Vasopneumatic Pressure Medium    Vasopneumatic Temperature  34      Manual Therapy   Manual therapy comments Seated knee flexion, supine knee extension    Soft tissue mobilization Instrument assisted with Hypervolt and manual soft tissue mob to quads with passive quad stretch in sitting.     Muscle Energy Technique contract relax with agonist & atagonist for increased knee flex & ext                    PT Short Term Goals - 08/25/20 0718      PT SHORT TERM GOAL #1   Title Patient demonstrates updated HEP    Status Achieved    Target Date 08/13/20      PT SHORT TERM GOAL #2   Title Knee left flexion 80*    Baseline MET 08/24/2020    Status Achieved    Target Date 09/10/20             PT Long Term Goals - 08/02/20 1637      PT LONG TERM GOAL #1   Title Patient will be independent in her HEP and progression.    Baseline inital program issued on 06/10/2020    Time 8    Period Weeks    Status On-going    Target Date 09/10/20      PT LONG TERM GOAL #2   Title Patient able to complete 5 time sit to stand </= 18 seconds to improve balance and functional mobility.    Baseline 25.3 seconds using UE support    Time 8    Period Weeks    Status On-going    Target Date 09/10/20      PT LONG TERM GOAL #3   Title Patient will be able to amb with straigth cane for >/= 500 feet on community level surfaces with step through gait pattern.    Baseline  currently using RW walking on househod distances    Time 8    Period Weeks    Status On-going    Target Date 09/10/20      PT LONG TERM GOAL #4   Title Pt will improve her L knee flexion to >/= 90 degrees go improve funtional mobility and gait.    Baseline 50 degrees actively      Time 8    Period Weeks    Status On-going    Target Date 09/10/20                 Plan - 08/26/20 1205    Clinical Impression Statement Patient late to PT session today so limited time.  After exercise & manual therapy PT was able to flex left knee to 82* seated.  Patient's anxiety & pain continues to limit her along with uncertain compliance with exercise program outside of PT.    Rehab Potential Fair    PT Frequency 2x / week    PT Duration 8 weeks    PT Treatment/Interventions Cryotherapy;Electrical Stimulation;ADLs/Self Care Home Management;Ultrasound;Gait training;Stair training;Functional mobility training;Therapeutic activities;Therapeutic exercise;Moist Heat;Balance training;Neuromuscular re-education;Patient/family education;Manual techniques;Scar mobilization;Dry needling;Taping;Vasopneumatic Device;Passive range of motion    PT Next Visit Plan continue with aggressive ROM as able, manual with focus on things pt can do independently at home    PT [place], Access Code: [email or web address]    Consulted and Agree with Plan of Care Patient           Patient will benefit from skilled therapeutic intervention in order to improve the following deficits and impairments:  Pain, Postural dysfunction, Decreased activity tolerance, Decreased strength, Impaired flexibility, Obesity, Decreased range of motion, Increased edema, Difficulty walking, Decreased balance  Visit Diagnosis: Acute pain of left knee  Stiffness of left knee, not elsewhere classified  Difficulty in walking, not elsewhere classified  Localized edema     Problem List Patient Active Problem List   Diagnosis Date Noted  . Arthrofibrosis of knee joint, left 06/24/2020  . S/P total knee arthroplasty, left 06/02/2020  . Noncompliance with CPAP treatment 09/24/2018  . A-fib ([place]) 08/31/2018  . CHF (congestive heart failure) ([place]) 08/31/2018  . Acute pyelonephritis 08/31/2018  . Sepsis secondary  to UTI ([place]) 08/31/2018  . Leucocytosis 08/31/2018  . Hypothyroidism 08/31/2018  . CKD (chronic kidney disease) stage 3, GFR 30-59 ml/min ([facility]) 08/31/2018  . HTN (hypertension) 08/31/2018  . HLD (hyperlipidemia) 08/31/2018  . Anxiety and depression 08/31/2018  . Obesity 08/31/2018  . Sepsis ([place]) 08/31/2018  . Back pain 08/31/2018  . Obstructive sleep apnea treated with continuous positive airway pressure (CPAP) 08/13/2018    Jamey Reas PT, DPT 08/26/2020, 12:31 PM  [facility] Physical Therapy [address] [place], [place], [address] Phone: [phone]   Fax:  [phone]  Name: Jennifer Foley MRN: [id number] Date of Birth: [birth year]

## 2020-08-31 ENCOUNTER — Other Ambulatory Visit: Payer: Self-pay

## 2020-08-31 ENCOUNTER — Encounter: Payer: Medicare Other | Admitting: Physical Therapy

## 2020-08-31 ENCOUNTER — Encounter: Payer: Self-pay | Admitting: Physical Therapy

## 2020-08-31 ENCOUNTER — Ambulatory Visit (INDEPENDENT_AMBULATORY_CARE_PROVIDER_SITE_OTHER): Payer: Medicare Other | Admitting: Physical Therapy

## 2020-08-31 DIAGNOSIS — M25562 Pain in left knee: Secondary | ICD-10-CM

## 2020-08-31 DIAGNOSIS — M25662 Stiffness of left knee, not elsewhere classified: Secondary | ICD-10-CM

## 2020-08-31 DIAGNOSIS — R600 Localized edema: Secondary | ICD-10-CM

## 2020-08-31 DIAGNOSIS — R262 Difficulty in walking, not elsewhere classified: Secondary | ICD-10-CM

## 2020-08-31 NOTE — Therapy (Signed)
[facility] Physical Therapy [address] [place], [place], [address] Phone: [phone]   Fax:  [phone]  Physical Therapy Treatment  Patient Details  Name: Jennifer Foley MRN: [id number] Date of Birth: [birth year] Referring Provider (PT): Rodell Perna, MD   Encounter Date: 08/31/2020   PT End of Session - 08/31/20 1442    Visit Number 22    Number of Visits 23    Date for PT Re-Evaluation 09/10/20    PT Start Time 4627    PT Stop Time 1517    PT Time Calculation (min) 38 min    Activity Tolerance Patient tolerated treatment well;Patient limited by pain    Behavior During Therapy [facility] for tasks assessed/performed           Past Medical History:  Diagnosis Date  . Anemia   . Arthritis    per patient, in left and right knee and both hands  . Atrial fibrillation ([place])   . Bilateral breast cysts   . CHF (congestive heart failure) ([place])   . Chronic kidney disease    stage 3  . Depression with anxiety   . DM (diabetes mellitus) ([place])   . Esophageal reflux   . History of hiatal hernia   . HTN (hypertension)   . Hyperlipemia   . Hyperthyroidism   . Hypothyroidism   . INR (international normal ratio) abnormal   . MRSA (methicillin resistant staph aureus) culture positive   . Panuveitis   . Pneumonia   . PTSD (post-traumatic stress disorder)   . Sleep apnea    per patient has CPAP, wears it at night sometimes    Past Surgical History:  Procedure Laterality Date  . ABDOMINAL HYSTERECTOMY    . BREAST BIOPSY    . CATARACT EXTRACTION  2018  . CESAREAN SECTION    . DENTAL SURGERY    . EYE SURGERY    . FOOT SURGERY    . HERNIA REPAIR    . KNEE ARTHROSCOPY Left 07/16/2020   Procedure: ARTHROSCOPY KNEE;  Surgeon: [name], MD;  Location: [place];  Service: Orthopedics;  Laterality: Left;  . KNEE CLOSED REDUCTION Left 07/16/2020   Procedure: LEFT KNEE MANIPULATION UNDER ANESTHESIA;  Surgeon: [name], MD;  Location: [place];  Service:  Orthopedics;  Laterality: Left;  . KNEE SURGERY    . TONSILLECTOMY AND ADENOIDECTOMY    . TOTAL KNEE ARTHROPLASTY Left 04/21/2020   Procedure: LEFT TOTAL KNEE ARTHROPLASTY;  Surgeon: [name], MD;  Location: [place];  Service: Orthopedics;  Laterality: Left;    There were no vitals filed for this visit.   Subjective Assessment - 08/31/20 1439    Subjective She has been doing exercises but admits not as much as she should    Limitations Walking;Standing;House hold activities    Diagnostic tests X-ray    Patient Stated Goals walk without a cane    Pain Score 5     Pain Location Knee    Pain Orientation Left    Pain Descriptors / Indicators Tightness;Aching;Sharp    Pain Onset More than a month ago    Pain Frequency Constant    Aggravating Factors  bending knee or stepping down onto LLE causes sharp pain    Pain Relieving Factors ice & meds              [facility] PT Assessment - 08/31/20 1439      PROM   Left Knee Extension -9    Left Knee  Flexion 82   post manual therapy                        [facility] Adult PT Treatment/Exercise - 08/31/20 1439      Ambulation/Gait   Ambulation/Gait Yes    Ambulation/Gait Assistance 5: Supervision    Assistive device Small based quad cane      Knee/Hip Exercises: Aerobic   Nustep Seat 6 level 6 for 8 minutes with 5 second hold left knee flexion stretch & 5 second press out.      Knee/Hip Exercises: Seated   Sit to Sand 2 sets;10 reps;with UE support   1st set BUEs, toes to wall to limit ext knee     Manual Therapy   Manual therapy comments Seated knee flexion, supine knee extension    Muscle [employer] contract relax with agonist & atagonist for increased knee flex & ext                    PT Short Term Goals - 08/25/20 0718      PT SHORT TERM GOAL #1   Title Patient demonstrates updated HEP    Status Achieved    Target Date 08/13/20      PT SHORT TERM GOAL #2   Title Knee left flexion 80*     Baseline MET 08/24/2020    Status Achieved    Target Date 09/10/20             PT Long Term Goals - 08/02/20 1637      PT LONG TERM GOAL #1   Title Patient will be independent in her HEP and progression.    Baseline inital program issued on 06/10/2020    Time 8    Period Weeks    Status On-going    Target Date 09/10/20      PT LONG TERM GOAL #2   Title Patient able to complete 5 time sit to stand </= 18 seconds to improve balance and functional mobility.    Baseline 25.3 seconds using UE support    Time 8    Period Weeks    Status On-going    Target Date 09/10/20      PT LONG TERM GOAL #3   Title Patient will be able to amb with straigth cane for >/= 500 feet on community level surfaces with step through gait pattern.    Baseline currently using RW walking on househod distances    Time 8    Period Weeks    Status On-going    Target Date 09/10/20      PT LONG TERM GOAL #4   Title Pt will improve her L knee flexion to >/= 90 degrees go improve funtional mobility and gait.    Baseline 50 degrees actively    Time 8    Period Weeks    Status On-going    Target Date 09/10/20                 Plan - 08/31/20 1444    Clinical Impression Statement Patient is making limited improvements during therapy session. Then it appears she does limited amount of exercise or stretches outside of PT which is limiting her overall progress between sessions.    Rehab Potential Fair    PT Frequency 2x / week    PT Duration 8 weeks    PT Treatment/Interventions Cryotherapy;Electrical Stimulation;ADLs/Self Care Home Management;Ultrasound;Gait training;Stair training;Functional mobility training;Therapeutic activities;Therapeutic exercise;Moist Heat;Balance training;Neuromuscular  re-education;Patient/family education;Manual techniques;Scar mobilization;Dry needling;Taping;Vasopneumatic Device;Passive range of motion    PT Next Visit Plan assess LTGs - discharge vs recert    PT Home  Exercise Plan [email or web address], Access Code: [email or web address]    Consulted and Agree with Plan of Care Patient           Patient will benefit from skilled therapeutic intervention in order to improve the following deficits and impairments:  Pain, Postural dysfunction, Decreased activity tolerance, Decreased strength, Impaired flexibility, Obesity, Decreased range of motion, Increased edema, Difficulty walking, Decreased balance  Visit Diagnosis: Acute pain of left knee  Stiffness of left knee, not elsewhere classified  Difficulty in walking, not elsewhere classified  Localized edema     Problem List Patient Active Problem List   Diagnosis Date Noted  . Arthrofibrosis of knee joint, left 06/24/2020  . S/P total knee arthroplasty, left 06/02/2020  . Noncompliance with CPAP treatment 09/24/2018  . A-fib ([place]) 08/31/2018  . CHF (congestive heart failure) ([place]) 08/31/2018  . Acute pyelonephritis 08/31/2018  . Sepsis secondary to UTI ([place]) 08/31/2018  . Leucocytosis 08/31/2018  . Hypothyroidism 08/31/2018  . CKD (chronic kidney disease) stage 3, GFR 30-59 ml/min (HCC) 08/31/2018  . HTN (hypertension) 08/31/2018  . HLD (hyperlipidemia) 08/31/2018  . Anxiety and depression 08/31/2018  . Obesity 08/31/2018  . Sepsis ([place]) 08/31/2018  . Back pain 08/31/2018  . Obstructive sleep apnea treated with continuous positive airway pressure (CPAP) 08/13/2018    Jamey Reas PT, DPT 08/31/2020, 4:24 PM  [facility] Physical Therapy [address] [place], [place], [address] Phone: [phone]   Fax:  [phone]  Name: Jennifer Foley MRN: [id number] Date of Birth: [birth year]

## 2020-09-02 ENCOUNTER — Ambulatory Visit (INDEPENDENT_AMBULATORY_CARE_PROVIDER_SITE_OTHER): Payer: Medicare Other | Admitting: Physical Therapy

## 2020-09-02 ENCOUNTER — Other Ambulatory Visit: Payer: Self-pay

## 2020-09-02 ENCOUNTER — Encounter: Payer: Self-pay | Admitting: Physical Therapy

## 2020-09-02 DIAGNOSIS — M25562 Pain in left knee: Secondary | ICD-10-CM

## 2020-09-02 DIAGNOSIS — R600 Localized edema: Secondary | ICD-10-CM | POA: Diagnosis not present

## 2020-09-02 DIAGNOSIS — R262 Difficulty in walking, not elsewhere classified: Secondary | ICD-10-CM | POA: Diagnosis not present

## 2020-09-02 DIAGNOSIS — M25662 Stiffness of left knee, not elsewhere classified: Secondary | ICD-10-CM

## 2020-09-02 NOTE — Therapy (Signed)
[facility] Physical Therapy [address] [place], [place], [address] Phone: [phone]   Fax:  [phone]  Physical Therapy Treatment  Patient Details  Name: Jennifer Foley MRN: [id number] Date of Birth: Jul 23, 1956 Referring Provider (PT): Rodell Perna, MD   Encounter Date: 09/02/2020   PT End of Session - 09/02/20 1359    Visit Number 23    Number of Visits 23    Date for PT Re-Evaluation 09/10/20    PT Start Time 9767   pt arrives 14 min late   PT Stop Time 1345    PT Time Calculation (min) 31 min    Activity Tolerance Patient tolerated treatment well;Patient limited by pain    Behavior During Therapy [facility] for tasks assessed/performed           Past Medical History:  Diagnosis Date  . Anemia   . Arthritis    per patient, in left and right knee and both hands  . Atrial fibrillation ([place])   . Bilateral breast cysts   . CHF (congestive heart failure) ([place])   . Chronic kidney disease    stage 3  . Depression with anxiety   . DM (diabetes mellitus) ([place])   . Esophageal reflux   . History of hiatal hernia   . HTN (hypertension)   . Hyperlipemia   . Hyperthyroidism   . Hypothyroidism   . INR (international normal ratio) abnormal   . MRSA (methicillin resistant staph aureus) culture positive   . Panuveitis   . Pneumonia   . PTSD (post-traumatic stress disorder)   . Sleep apnea    per patient has CPAP, wears it at night sometimes    Past Surgical History:  Procedure Laterality Date  . ABDOMINAL HYSTERECTOMY    . BREAST BIOPSY    . CATARACT EXTRACTION  2018  . CESAREAN SECTION    . DENTAL SURGERY    . EYE SURGERY    . FOOT SURGERY    . HERNIA REPAIR    . KNEE ARTHROSCOPY Left 07/16/2020   Procedure: ARTHROSCOPY KNEE;  Surgeon: [name], MD;  Location: [place];  Service: Orthopedics;  Laterality: Left;  . KNEE CLOSED REDUCTION Left 07/16/2020   Procedure: LEFT KNEE MANIPULATION UNDER ANESTHESIA;  Surgeon: [name], MD;  Location:  [place];  Service: Orthopedics;  Laterality: Left;  . KNEE SURGERY    . TONSILLECTOMY AND ADENOIDECTOMY    . TOTAL KNEE ARTHROPLASTY Left 04/21/2020   Procedure: LEFT TOTAL KNEE ARTHROPLASTY;  Surgeon: [name], MD;  Location: [place];  Service: Orthopedics;  Laterality: Left;    There were no vitals filed for this visit.   Subjective Assessment - 09/02/20 1356    Subjective she relays her knee her knee is really bothering her today and she is tired, 7/10 overall pain    Limitations Walking;Standing;House hold activities    Diagnostic tests X-ray    Patient Stated Goals walk without a cane    Pain Onset More than a month ago                             [facility] Adult PT Treatment/Exercise - 09/02/20 0001      Ambulation/Gait   Ambulation/Gait Yes    Ambulation/Gait Assistance 6: Modified independent (Device/Increase time)    Gait Comments 100 ft with quad cane, 50 ft X 2 no AD      Knee/Hip Exercises: Stretches   Knee: Self-Stretch  Limitations standing lunge stretch with foot on step 8 inch 10 reps X 10 sec holds. Seated tailgate flexion 10 sec X 10 reps      Knee/Hip Exercises: Aerobic   Nustep Seat 6 level 6 for 8 minutes with 5 second hold left knee flexion stretch & 5 second press out.      Knee/Hip Exercises: Standing   Other Standing Knee Exercises sidestepping 3 cones X 5 reps bilat      Knee/Hip Exercises: Seated   Sit to Sand 2 sets;10 reps;with UE support   toes to wall to limit ext knee     Manual Therapy   Manual therapy comments Seated knee flexion and extension, having her perform deep inhale and slow exhale during stretching    Muscle [employer] contract relax with agonist & atagonist for increased knee flex & ext                    PT Short Term Goals - 08/25/20 0718      PT SHORT TERM GOAL #1   Title Patient demonstrates updated HEP    Status Achieved    Target Date 08/13/20      PT SHORT TERM GOAL #2   Title Knee left  flexion 80*    Baseline MET 08/24/2020    Status Achieved    Target Date 09/10/20             PT Long Term Goals - 08/02/20 1637      PT LONG TERM GOAL #1   Title Patient will be independent in her HEP and progression.    Baseline inital program issued on 06/10/2020    Time 8    Period Weeks    Status On-going    Target Date 09/10/20      PT LONG TERM GOAL #2   Title Patient able to complete 5 time sit to stand </= 18 seconds to improve balance and functional mobility.    Baseline 25.3 seconds using UE support    Time 8    Period Weeks    Status On-going    Target Date 09/10/20      PT LONG TERM GOAL #3   Title Patient will be able to amb with straigth cane for >/= 500 feet on community level surfaces with step through gait pattern.    Baseline currently using RW walking on househod distances    Time 8    Period Weeks    Status On-going    Target Date 09/10/20      PT LONG TERM GOAL #4   Title Pt will improve her L knee flexion to >/= 90 degrees go improve funtional mobility and gait.    Baseline 50 degrees actively    Time 8    Period Weeks    Status On-going    Target Date 09/10/20                 Plan - 09/02/20 1402    Clinical Impression Statement Again focused on aggressive ROM as this is her biggest limiting factor. She did appear to express less overall pain during manual stretching while having her focus on deep breathing with slow exhale as PT moved into as much flexion as tolerated. She will need recert vs DC next visit.    Comorbidities L TKA 04/21/2020, a-fib, arthritis, CHF, CKD, DM, depression, HTN, sleep apnea, PTSD, hyperthyroidism    Rehab Potential Fair    PT Frequency 2x /  week    PT Duration 8 weeks    PT Treatment/Interventions Cryotherapy;Electrical Stimulation;ADLs/Self Care Home Management;Ultrasound;Gait training;Stair training;Functional mobility training;Therapeutic activities;Therapeutic exercise;Moist Heat;Balance  training;Neuromuscular re-education;Patient/family education;Manual techniques;Scar mobilization;Dry needling;Taping;Vasopneumatic Device;Passive range of motion    PT Next Visit Plan assess LTGs - discharge vs recert    PT Home Exercise Plan [email or web address], Access Code: [email or web address]    Consulted and Agree with Plan of Care Patient           Patient will benefit from skilled therapeutic intervention in order to improve the following deficits and impairments:  Pain, Postural dysfunction, Decreased activity tolerance, Decreased strength, Impaired flexibility, Obesity, Decreased range of motion, Increased edema, Difficulty walking, Decreased balance  Visit Diagnosis: Acute pain of left knee  Stiffness of left knee, not elsewhere classified  Difficulty in walking, not elsewhere classified  Localized edema     Problem List Patient Active Problem List   Diagnosis Date Noted  . Arthrofibrosis of knee joint, left 06/24/2020  . S/P total knee arthroplasty, left 06/02/2020  . Noncompliance with CPAP treatment 09/24/2018  . A-fib ([place]) 08/31/2018  . CHF (congestive heart failure) ([place]) 08/31/2018  . Acute pyelonephritis 08/31/2018  . Sepsis secondary to UTI ([address]) 08/31/2018  . Leucocytosis 08/31/2018  . Hypothyroidism 08/31/2018  . CKD (chronic kidney disease) stage 3, GFR 30-59 ml/min (HCC) 08/31/2018  . HTN (hypertension) 08/31/2018  . HLD (hyperlipidemia) 08/31/2018  . Anxiety and depression 08/31/2018  . Obesity 08/31/2018  . Sepsis ([place]) 08/31/2018  . Back pain 08/31/2018  . Obstructive sleep apnea treated with continuous positive airway pressure (CPAP) 08/13/2018    Silvestre Mesi 09/02/2020, 2:04 PM  [facility] Physical Therapy [address] [place], [place], [address] Phone: [phone]   Fax:  [phone]  Name: Jonalyn Sedlak MRN: [id number] Date of Birth: 19-Jan-1956

## 2020-09-07 ENCOUNTER — Encounter: Payer: Medicare Other | Admitting: Physical Therapy

## 2020-09-10 ENCOUNTER — Ambulatory Visit (INDEPENDENT_AMBULATORY_CARE_PROVIDER_SITE_OTHER): Payer: Medicare Other | Admitting: Physical Therapy

## 2020-09-10 ENCOUNTER — Other Ambulatory Visit: Payer: Self-pay

## 2020-09-10 ENCOUNTER — Encounter: Payer: Self-pay | Admitting: Rehabilitative and Restorative Service Providers"

## 2020-09-10 DIAGNOSIS — M25562 Pain in left knee: Secondary | ICD-10-CM | POA: Diagnosis not present

## 2020-09-10 DIAGNOSIS — R600 Localized edema: Secondary | ICD-10-CM

## 2020-09-10 DIAGNOSIS — M25662 Stiffness of left knee, not elsewhere classified: Secondary | ICD-10-CM | POA: Diagnosis not present

## 2020-09-10 DIAGNOSIS — R262 Difficulty in walking, not elsewhere classified: Secondary | ICD-10-CM | POA: Diagnosis not present

## 2020-09-10 NOTE — Therapy (Signed)
[facility] Physical Therapy [address] [place], [place], [address] Phone: [phone]   Fax:  [phone]  Physical Therapy Treatment/Recert  Patient Details  Name: Jennifer Foley MRN: [id number] Date of Birth: 1956-09-04 Referring Provider (PT): Rodell Perna, MD   Encounter Date: 09/10/2020   PT End of Session - 09/10/20 1308    Visit Number 24    Number of Visits 31    Date for PT Re-Evaluation 10/08/20    PT Start Time 1106    PT Stop Time 1145    PT Time Calculation (min) 39 min    Activity Tolerance Patient tolerated treatment well;Patient limited by pain    Behavior During Therapy [facility] for tasks assessed/performed           Past Medical History:  Diagnosis Date  . Anemia   . Arthritis    per patient, in left and right knee and both hands  . Atrial fibrillation ([place])   . Bilateral breast cysts   . CHF (congestive heart failure) ([place])   . Chronic kidney disease    stage 3  . Depression with anxiety   . DM (diabetes mellitus) ([place])   . Esophageal reflux   . History of hiatal hernia   . HTN (hypertension)   . Hyperlipemia   . Hyperthyroidism   . Hypothyroidism   . INR (international normal ratio) abnormal   . MRSA (methicillin resistant staph aureus) culture positive   . Panuveitis   . Pneumonia   . PTSD (post-traumatic stress disorder)   . Sleep apnea    per patient has CPAP, wears it at night sometimes    Past Surgical History:  Procedure Laterality Date  . ABDOMINAL HYSTERECTOMY    . BREAST BIOPSY    . CATARACT EXTRACTION  2018  . CESAREAN SECTION    . DENTAL SURGERY    . EYE SURGERY    . FOOT SURGERY    . HERNIA REPAIR    . KNEE ARTHROSCOPY Left 07/16/2020   Procedure: ARTHROSCOPY KNEE;  Surgeon: [name], MD;  Location: [place];  Service: Orthopedics;  Laterality: Left;  . KNEE CLOSED REDUCTION Left 07/16/2020   Procedure: LEFT KNEE MANIPULATION UNDER ANESTHESIA;  Surgeon: [name], MD;  Location: [place];  Service:  Orthopedics;  Laterality: Left;  . KNEE SURGERY    . TONSILLECTOMY AND ADENOIDECTOMY    . TOTAL KNEE ARTHROPLASTY Left 04/21/2020   Procedure: LEFT TOTAL KNEE ARTHROPLASTY;  Surgeon: [name], MD;  Location: [place];  Service: Orthopedics;  Laterality: Left;    There were no vitals filed for this visit.   Subjective Assessment - 09/10/20 1307    Subjective she asks what stretches she should be doing at home. She was again reminded of most important stretches to improve flexion ROM.    Limitations Walking;Standing;House hold activities    Diagnostic tests X-ray    Patient Stated Goals walk without a cane    Pain Onset More than a month ago              [facility] PT Assessment - 09/10/20 0001      Assessment   Medical Diagnosis L TKA    Referring Provider (PT) Rodell Perna, MD    Onset Date/Surgical Date 04/21/20    Next MD Visit 11/30      AROM   Left Knee Extension -4    Left Knee Flexion 60      PROM   Left Knee Flexion 70  Strength   Left Knee Flexion 5/5    Left Knee Extension 4+/5      Balance   Balance Assessed Yes      Standardized Balance Assessment   Standardized Balance Assessment Five Times Sit to Stand    Five times sit to stand comments  18 sec from standard chair with UE support                         [facility] Adult PT Treatment/Exercise - 09/10/20 0001      Ambulation/Gait   Ambulation/Gait Yes    Ambulation/Gait Assistance 6: Modified independent (Device/Increase time)    Ambulation/Gait Assistance Details 600 ft, No AD, increased time      Knee/Hip Exercises: Stretches   Sports administrator Left;3 reps;60 seconds    Quad Stretch Limitations prone with strap    Knee: Self-Stretch Limitations standing lunge stretch with foot on step 8 inch 10 reps X 10 sec holds. Seated tailgate flexion 10 sec X 10 reps      Knee/Hip Exercises: Aerobic   Nustep --      Knee/Hip Exercises: Machines for Strengthening   Cybex Leg Press --      Manual  Therapy   Manual therapy comments Seated and supine knee flexion and extension, having her perform deep inhale and slow exhale during stretching    Muscle [employer] contract relax with agonist & atagonist for increased knee flex & ext                    PT Short Term Goals - 09/10/20 1154      PT SHORT TERM GOAL #1   Title Patient demonstrates updated HEP    Status Achieved    Target Date 08/13/20      PT SHORT TERM GOAL #2   Title Knee left flexion 80*    Baseline MET 08/24/2020, but only 70 deg on 11/19    Status On-going    Target Date 09/10/20             PT Long Term Goals - 09/10/20 1155      PT LONG TERM GOAL #1   Title Patient will be independent in her HEP and progression.    Baseline inital program issued on 06/10/2020, needs constant cues for which stretches to do at home so PT is questioning her compliance    Time 8    Period Weeks    Status On-going      PT LONG TERM GOAL #2   Title Patient able to complete 5 time sit to stand </= 18 seconds to improve balance and functional mobility.    Baseline 18 seconds using UE support on 11/19    Time 8    Period Weeks    Status Achieved      PT LONG TERM GOAL #3   Title Patient will be able to amb with straigth cane for >/= 500 feet on community level surfaces with step through gait pattern.    Baseline 600 ft without AD on 11/19    Time 8    Period Weeks    Status Achieved      PT LONG TERM GOAL #4   Title Pt will improve her L knee flexion to >/= 90 degrees go improve funtional mobility and gait.    Baseline 70 deg 11/19    Time 8    Period Weeks    Status On-going  Plan - 09/10/20 1309    Clinical Impression Statement Recert today as POC was up. Her fleixon ROM is not improving and she was again hightly encouraged to stretch more at home. PT questions her HEP compliance as she again asks which stretches she should be doing and this has already been provided to her.  She has however now met her ambulation goal and 5 times sit to stand goal showing improved overall functional status. She will continue to benefit from 3-4 more weeks of PT to push ROM as much as tolerated.    Comorbidities L TKA 04/21/2020, a-fib, arthritis, CHF, CKD, DM, depression, HTN, sleep apnea, PTSD, hyperthyroidism    Rehab Potential Fair    PT Frequency 2x / week    PT Duration 8 weeks    PT Treatment/Interventions Cryotherapy;Electrical Stimulation;ADLs/Self Care Home Management;Ultrasound;Gait training;Stair training;Functional mobility training;Therapeutic activities;Therapeutic exercise;Moist Heat;Balance training;Neuromuscular re-education;Patient/family education;Manual techniques;Scar mobilization;Dry needling;Taping;Vasopneumatic Device;Passive range of motion    PT Next Visit Plan ROM focus    PT Home Exercise Plan [email or web address], Access Code: [email or web address]    Consulted and Agree with Plan of Care Patient           Patient will benefit from skilled therapeutic intervention in order to improve the following deficits and impairments:  Pain, Postural dysfunction, Decreased activity tolerance, Decreased strength, Impaired flexibility, Obesity, Decreased range of motion, Increased edema, Difficulty walking, Decreased balance  Visit Diagnosis: Acute pain of left knee  Stiffness of left knee, not elsewhere classified  Difficulty in walking, not elsewhere classified  Localized edema     Problem List Patient Active Problem List   Diagnosis Date Noted  . Arthrofibrosis of knee joint, left 06/24/2020  . S/P total knee arthroplasty, left 06/02/2020  . Noncompliance with CPAP treatment 09/24/2018  . A-fib ([place]) 08/31/2018  . CHF (congestive heart failure) ([place]) 08/31/2018  . Acute pyelonephritis 08/31/2018  . Sepsis secondary to UTI ([place]) 08/31/2018  . Leucocytosis 08/31/2018  . Hypothyroidism 08/31/2018  . CKD (chronic kidney disease) stage 3, GFR 30-59 ml/min (HCC) 08/31/2018  .  HTN (hypertension) 08/31/2018  . HLD (hyperlipidemia) 08/31/2018  . Anxiety and depression 08/31/2018  . Obesity 08/31/2018  . Sepsis ([place]) 08/31/2018  . Back pain 08/31/2018  . Obstructive sleep apnea treated with continuous positive airway pressure (CPAP) 08/13/2018    Silvestre Mesi 09/10/2020, 1:17 PM  [facility] Physical Therapy [address] [place], [place], [address] Phone: [phone]   Fax:  [phone]  Name: Jennifer Foley MRN: [id number] Date of Birth: 1955-11-19

## 2020-09-13 ENCOUNTER — Encounter: Payer: Medicare Other | Admitting: Rehabilitative and Restorative Service Providers"

## 2020-09-13 ENCOUNTER — Telehealth: Payer: Self-pay | Admitting: Rehabilitative and Restorative Service Providers"

## 2020-09-13 NOTE — Telephone Encounter (Signed)
Called Pt. And left message about missed appointment time and reminded of next appointment time on Nov 24  Scot Jun, PT, DPT, OCS, ATC 09/13/20  2:23 PM

## 2020-09-15 ENCOUNTER — Ambulatory Visit (INDEPENDENT_AMBULATORY_CARE_PROVIDER_SITE_OTHER): Payer: Medicare Other | Admitting: Rehabilitative and Restorative Service Providers"

## 2020-09-15 ENCOUNTER — Other Ambulatory Visit: Payer: Self-pay

## 2020-09-15 ENCOUNTER — Encounter: Payer: Self-pay | Admitting: Rehabilitative and Restorative Service Providers"

## 2020-09-15 DIAGNOSIS — R262 Difficulty in walking, not elsewhere classified: Secondary | ICD-10-CM

## 2020-09-15 DIAGNOSIS — R600 Localized edema: Secondary | ICD-10-CM

## 2020-09-15 DIAGNOSIS — M25662 Stiffness of left knee, not elsewhere classified: Secondary | ICD-10-CM

## 2020-09-15 DIAGNOSIS — M25562 Pain in left knee: Secondary | ICD-10-CM

## 2020-09-15 NOTE — Therapy (Signed)
[facility] [address] [place], [place], [address] Phone: [phone]   Fax:  [phone]  Physical Therapy Treatment  Patient Details  Name: Jennifer Foley MRN: [id number] Date of Birth: 24-Feb-1956 Referring Provider (PT): Rodell Perna, MD   Encounter Date: 09/15/2020   PT End of Session - 09/15/20 1449    Visit Number 25    Number of Visits 31    Date for PT Re-Evaluation 10/08/20    PT Start Time 4259    PT Stop Time 1523    PT Time Calculation (min) 39 min    Activity Tolerance Patient tolerated treatment well;Patient limited by pain    Behavior During Therapy [facility] for tasks assessed/performed           Past Medical History:  Diagnosis Date  . Anemia   . Arthritis    per patient, in left and right knee and both hands  . Atrial fibrillation ([place])   . Bilateral breast cysts   . CHF (congestive heart failure) ([place])   . Chronic kidney disease    stage 3  . Depression with anxiety   . DM (diabetes mellitus) ([place])   . Esophageal reflux   . History of hiatal hernia   . HTN (hypertension)   . Hyperlipemia   . Hyperthyroidism   . Hypothyroidism   . INR (international normal ratio) abnormal   . MRSA (methicillin resistant staph aureus) culture positive   . Panuveitis   . Pneumonia   . PTSD (post-traumatic stress disorder)   . Sleep apnea    per patient has CPAP, wears it at night sometimes    Past Surgical History:  Procedure Laterality Date  . ABDOMINAL HYSTERECTOMY    . BREAST BIOPSY    . CATARACT EXTRACTION  2018  . CESAREAN SECTION    . DENTAL SURGERY    . EYE SURGERY    . FOOT SURGERY    . HERNIA REPAIR    . KNEE ARTHROSCOPY Left 07/16/2020   Procedure: ARTHROSCOPY KNEE;  Surgeon: [name], MD;  Location: [place];  Service: Orthopedics;  Laterality: Left;  . KNEE CLOSED REDUCTION Left 07/16/2020   Procedure: LEFT KNEE MANIPULATION UNDER ANESTHESIA;  Surgeon: [name], MD;  Location: [place];  Service:  Orthopedics;  Laterality: Left;  . KNEE SURGERY    . TONSILLECTOMY AND ADENOIDECTOMY    . TOTAL KNEE ARTHROPLASTY Left 04/21/2020   Procedure: LEFT TOTAL KNEE ARTHROPLASTY;  Surgeon: [name], MD;  Location: [place];  Service: Orthopedics;  Laterality: Left;    There were no vitals filed for this visit.   Subjective Assessment - 09/15/20 1450    Subjective Pt. stated feeling like she was walking better.  Pt. stated tightness still noted.    Limitations Walking;Standing;House hold activities    Diagnostic tests X-ray    Patient Stated Goals walk without a cane    Pain Score 5     Pain Location Knee    Pain Orientation Left    Pain Descriptors / Indicators Aching    Pain Type Chronic pain    Pain Onset More than a month ago    Pain Frequency Constant    Aggravating Factors  end range, consistent ache                             [facility] Adult PT Treatment/Exercise - 09/15/20 0001      Knee/Hip Exercises: Aerobic  Nustep Seat 7 Lvl 5 6 mins, RPM range 50-60      Knee/Hip Exercises: Machines for Strengthening   Cybex Knee Extension eccentric Lt LE 3 x 10 10 lbs    Cybex Knee Flexion SL curl 10 lbs 3 x 10    Other Machine LLLD stretching 5 lbs, hole 5 from most bent 5 mins c percussive device on quad                    PT Short Term Goals - 09/10/20 1154      PT SHORT TERM GOAL #1   Title Patient demonstrates updated HEP    Status Achieved    Target Date 08/13/20      PT SHORT TERM GOAL #2   Title Knee left flexion 80*    Baseline MET 08/24/2020, but only 70 deg on 11/19    Status On-going    Target Date 09/10/20             PT Long Term Goals - 09/10/20 1155      PT LONG TERM GOAL #1   Title Patient will be independent in her HEP and progression.    Baseline inital program issued on 06/10/2020, needs constant cues for which stretches to do at home so PT is questioning her compliance    Time 8    Period Weeks    Status On-going       PT LONG TERM GOAL #2   Title Patient able to complete 5 time sit to stand </= 18 seconds to improve balance and functional mobility.    Baseline 18 seconds using UE support on 11/19    Time 8    Period Weeks    Status Achieved      PT LONG TERM GOAL #3   Title Patient will be able to amb with straigth cane for >/= 500 feet on community level surfaces with step through gait pattern.    Baseline 600 ft without AD on 11/19    Time 8    Period Weeks    Status Achieved      PT LONG TERM GOAL #4   Title Pt will improve her L knee flexion to >/= 90 degrees go improve funtional mobility and gait.    Baseline 70 deg 11/19    Time 8    Period Weeks    Status On-going                 Plan - 09/15/20 1507    Clinical Impression Statement Inclusion today on low load long duration for collagen mobility, as well as strengthening throughout full range c fair to good tolerance overall.  Pt. has one more visit prior to return to MD.    Comorbidities L TKA 04/21/2020, a-fib, arthritis, CHF, CKD, DM, depression, HTN, sleep apnea, PTSD, hyperthyroidism    Rehab Potential Fair    PT Frequency 2x / week    PT Duration 8 weeks    PT Treatment/Interventions Cryotherapy;Electrical Stimulation;ADLs/Self Care Home Management;Ultrasound;Gait training;Stair training;Functional mobility training;Therapeutic activities;Therapeutic exercise;Moist Heat;Balance training;Neuromuscular re-education;Patient/family education;Manual techniques;Scar mobilization;Dry needling;Taping;Vasopneumatic Device;Passive range of motion    PT Next Visit Plan Continue low load stretching for long durations in clinc and home, full range strengthening.    PT Home Exercise Plan [email or web address], Access Code: [email or web address]    Consulted and Agree with Plan of Care Patient           Patient will benefit from skilled therapeutic intervention in order  to improve the following deficits and impairments:  Pain, Postural dysfunction, Decreased  activity tolerance, Decreased strength, Impaired flexibility, Obesity, Decreased range of motion, Increased edema, Difficulty walking, Decreased balance  Visit Diagnosis: Acute pain of left knee  Stiffness of left knee, not elsewhere classified  Difficulty in walking, not elsewhere classified  Localized edema     Problem List Patient Active Problem List   Diagnosis Date Noted  . Arthrofibrosis of knee joint, left 06/24/2020  . S/P total knee arthroplasty, left 06/02/2020  . Noncompliance with CPAP treatment 09/24/2018  . A-fib ([place]) 08/31/2018  . CHF (congestive heart failure) ([place]) 08/31/2018  . Acute pyelonephritis 08/31/2018  . Sepsis secondary to UTI ([place]) 08/31/2018  . Leucocytosis 08/31/2018  . Hypothyroidism 08/31/2018  . CKD (chronic kidney disease) stage 3, GFR 30-59 ml/min (HCC) 08/31/2018  . HTN (hypertension) 08/31/2018  . HLD (hyperlipidemia) 08/31/2018  . Anxiety and depression 08/31/2018  . Obesity 08/31/2018  . Sepsis ([place]) 08/31/2018  . Back pain 08/31/2018  . Obstructive sleep apnea treated with continuous positive airway pressure (CPAP) 08/13/2018   Scot Jun, PT, DPT, OCS, ATC 09/15/20  3:16 PM    [facility] [address] [place], [place], [address] Phone: [phone]   Fax:  [phone]  Name: Jennifer Foley MRN: [id number] Date of Birth: 10-Mar-1956

## 2020-09-20 ENCOUNTER — Encounter: Payer: Self-pay | Admitting: Physical Therapy

## 2020-09-20 ENCOUNTER — Other Ambulatory Visit: Payer: Self-pay

## 2020-09-20 ENCOUNTER — Ambulatory Visit (INDEPENDENT_AMBULATORY_CARE_PROVIDER_SITE_OTHER): Payer: Medicare Other | Admitting: Physical Therapy

## 2020-09-20 DIAGNOSIS — M25662 Stiffness of left knee, not elsewhere classified: Secondary | ICD-10-CM

## 2020-09-20 DIAGNOSIS — R262 Difficulty in walking, not elsewhere classified: Secondary | ICD-10-CM | POA: Diagnosis not present

## 2020-09-20 DIAGNOSIS — R600 Localized edema: Secondary | ICD-10-CM | POA: Diagnosis not present

## 2020-09-20 DIAGNOSIS — M25562 Pain in left knee: Secondary | ICD-10-CM

## 2020-09-20 NOTE — Therapy (Addendum)
[facility] [address] [place], [place], [address] Phone: [phone]   Fax:  [phone]  Physical Therapy Treatment/Discharge Summary  Patient Details  Name: Jennifer Foley MRN: [id number] Date of Birth: Jan 02, 1956 Referring Provider (PT): Rodell Perna, MD   Encounter Date: 09/20/2020   PT End of Session - 09/20/20 1421    Visit Number 26    Number of Visits 31    Date for PT Re-Evaluation 10/08/20    PT Start Time 4628    PT Stop Time 1421    PT Time Calculation (min) 39 min    Activity Tolerance Patient tolerated treatment well;Patient limited by pain    Behavior During Therapy [facility] for tasks assessed/performed           Past Medical History:  Diagnosis Date  . Anemia   . Arthritis    per patient, in left and right knee and both hands  . Atrial fibrillation ([place])   . Bilateral breast cysts   . CHF (congestive heart failure) ([place])   . Chronic kidney disease    stage 3  . Depression with anxiety   . DM (diabetes mellitus) ([place])   . Esophageal reflux   . History of hiatal hernia   . HTN (hypertension)   . Hyperlipemia   . Hyperthyroidism   . Hypothyroidism   . INR (international normal ratio) abnormal   . MRSA (methicillin resistant staph aureus) culture positive   . Panuveitis   . Pneumonia   . PTSD (post-traumatic stress disorder)   . Sleep apnea    per patient has CPAP, wears it at night sometimes    Past Surgical History:  Procedure Laterality Date  . ABDOMINAL HYSTERECTOMY    . BREAST BIOPSY    . CATARACT EXTRACTION  2018  . CESAREAN SECTION    . DENTAL SURGERY    . EYE SURGERY    . FOOT SURGERY    . HERNIA REPAIR    . KNEE ARTHROSCOPY Left 07/16/2020   Procedure: ARTHROSCOPY KNEE;  Surgeon: [name], MD;  Location: [place];  Service: Orthopedics;  Laterality: Left;  . KNEE CLOSED REDUCTION Left 07/16/2020   Procedure: LEFT KNEE MANIPULATION UNDER ANESTHESIA;  Surgeon: [name], MD;  Location: [place];   Service: Orthopedics;  Laterality: Left;  . KNEE SURGERY    . TONSILLECTOMY AND ADENOIDECTOMY    . TOTAL KNEE ARTHROPLASTY Left 04/21/2020   Procedure: LEFT TOTAL KNEE ARTHROPLASTY;  Surgeon: [name], MD;  Location: [place];  Service: Orthopedics;  Laterality: Left;    There were no vitals filed for this visit.   Subjective Assessment - 09/20/20 1347    Subjective walking better, says she's trying to work on the bending at home.  knee is still tight.  overall doing well from a functional standpoint.    Limitations Walking;Standing;House hold activities    Diagnostic tests X-ray    Patient Stated Goals walk without a cane    Currently in Pain? Yes    Pain Score 7     Pain Location Knee    Pain Orientation Left    Pain Descriptors / Indicators Aching    Pain Type Chronic pain    Pain Onset More than a month ago    Pain Frequency Constant    Aggravating Factors  end range, consistent ache    Pain Relieving Factors ice and meds  [facility] Adult PT Treatment/Exercise - 09/20/20 1348      Knee/Hip Exercises: Stretches   Knee: Self-Stretch to increase Flexion Left   10 x 15 sec     Knee/Hip Exercises: Aerobic   Nustep Seat 5 Lvl 6 x 8 mins, RPM range 50-60      Manual Therapy   Manual therapy comments Seated and supine knee flexion and extension, having her perform deep inhale and slow exhale during stretching                    PT Short Term Goals - 09/10/20 1154      PT SHORT TERM GOAL #1   Title Patient demonstrates updated HEP    Status Achieved    Target Date 08/13/20      PT SHORT TERM GOAL #2   Title Knee left flexion 80*    Baseline MET 08/24/2020, but only 70 deg on 11/19    Status On-going    Target Date 09/10/20             PT Long Term Goals - 09/20/20 1421      PT LONG TERM GOAL #1   Title Patient will be independent in her HEP and progression.    Baseline inconsistent with HEP; reminded of all  exercises at each session    Time 8    Period Weeks    Status Partially Met      PT LONG TERM GOAL #2   Title Patient able to complete 5 time sit to stand </= 18 seconds to improve balance and functional mobility.    Baseline 18 seconds using UE support on 11/19    Time 8    Period Weeks    Status Achieved      PT LONG TERM GOAL #3   Title Patient will be able to amb with straigth cane for >/= 500 feet on community level surfaces with step through gait pattern.    Baseline 600 ft without AD on 11/19    Time 8    Period Weeks    Status Achieved      PT LONG TERM GOAL #4   Title Pt will improve her L knee flexion to >/= 90 degrees go improve funtional mobility and gait.    Baseline 70 deg 11/29    Time 8    Period Weeks    Status Not Met                 Plan - 09/20/20 1423    Clinical Impression Statement Session focused on HEP review and final ROM measurement.  Pt continues to need reminders of HEP at home and consistent work on flexion.  At this time feel pt has maximized rehab potential, and overall doing well from a functional standpoint.  ROM with limited change over past 4-6 weeks despite constant work.    Comorbidities L TKA 04/21/2020, a-fib, arthritis, CHF, CKD, DM, depression, HTN, sleep apnea, PTSD, hyperthyroidism    Rehab Potential Fair    PT Frequency 2x / week    PT Duration 8 weeks    PT Treatment/Interventions Cryotherapy;Electrical Stimulation;ADLs/Self Care Home Management;Ultrasound;Gait training;Stair training;Functional mobility training;Therapeutic activities;Therapeutic exercise;Moist Heat;Balance training;Neuromuscular re-education;Patient/family education;Manual techniques;Scar mobilization;Dry needling;Taping;Vasopneumatic Device;Passive range of motion    PT Next Visit Plan see what MD says plan for d/c    PT [place], Access Code: [email or web address]    Consulted and Agree with Plan of Care Patient  Patient will benefit  from skilled therapeutic intervention in order to improve the following deficits and impairments:  Pain, Postural dysfunction, Decreased activity tolerance, Decreased strength, Impaired flexibility, Obesity, Decreased range of motion, Increased edema, Difficulty walking, Decreased balance  Visit Diagnosis: Acute pain of left knee  Stiffness of left knee, not elsewhere classified  Difficulty in walking, not elsewhere classified  Localized edema     Problem List Patient Active Problem List   Diagnosis Date Noted  . Arthrofibrosis of knee joint, left 06/24/2020  . S/P total knee arthroplasty, left 06/02/2020  . Noncompliance with CPAP treatment 09/24/2018  . A-fib ([place]) 08/31/2018  . CHF (congestive heart failure) ([place]) 08/31/2018  . Acute pyelonephritis 08/31/2018  . Sepsis secondary to UTI ([place]) 08/31/2018  . Leucocytosis 08/31/2018  . Hypothyroidism 08/31/2018  . CKD (chronic kidney disease) stage 3, GFR 30-59 ml/min (HCC) 08/31/2018  . HTN (hypertension) 08/31/2018  . HLD (hyperlipidemia) 08/31/2018  . Anxiety and depression 08/31/2018  . Obesity 08/31/2018  . Sepsis ([place]) 08/31/2018  . Back pain 08/31/2018  . Obstructive sleep apnea treated with continuous positive airway pressure (CPAP) 08/13/2018     Laureen Abrahams, PT, DPT 09/20/20 2:26 PM    [facility] [address] [place], [place], [address] Phone: [phone]   Fax:  [phone]  Name: Jennifer Foley MRN: [id number] Date of Birth: 08/17/1956     PHYSICAL THERAPY DISCHARGE SUMMARY  Visits from Start of Care: 26  Current functional level related to goals / functional outcomes: See above   Remaining deficits: See above   Education / Equipment: HEP - extensive review  Plan: Patient agrees to discharge.  Patient goals were partially met. Patient is being discharged due to lack of progress. ?Pt has achieved maximal rehab potential.     Laureen Abrahams, PT, DPT 10/25/20 12:04 PM  [facility] [address] [place], [place], [address] Phone: [phone]   Fax:  [phone]

## 2020-09-21 ENCOUNTER — Encounter: Payer: Self-pay | Admitting: Orthopaedic Surgery

## 2020-09-21 ENCOUNTER — Ambulatory Visit (INDEPENDENT_AMBULATORY_CARE_PROVIDER_SITE_OTHER): Payer: Medicare Other | Admitting: Orthopaedic Surgery

## 2020-09-21 VITALS — Ht 60.0 in | Wt 202.0 lb

## 2020-09-21 DIAGNOSIS — M24662 Ankylosis, left knee: Secondary | ICD-10-CM

## 2020-09-21 NOTE — Progress Notes (Signed)
Post-Op Visit Note   Patient: Jennifer Foley           Date of Birth: 1955/12/13           MRN: [id number] Visit Date: 09/21/2020 PCP: Bartholome Bill, MD   Assessment & Plan: 63 year old female returns post knee arthroscopy and manipulation due to adhesive capsulitis.  She states she is walking better than she was before surgery but also admits she really has not done her exercises on her own at home and basically did work when she is with her therapist.  She has plateaued working with him and again we repeated the long discussion we have had previously but if she will work on flexion on her own at home on a daily basis she will continue to pick up some more flexion.  She is walking better not using a cane.  Patient states when she bends her knee she feels some pain and she just does not like to feel pain so she stops.  Again patient was encouraged that if she did her exercises she would get continued improvement would be very happy with her outcome.  Recheck 2 months.  Chief Complaint:  Chief Complaint  Patient presents with  . Left Knee - Follow-up    07/16/2020 left knee MUA, left knee arthroscopy   Visit Diagnoses:  1. Arthrofibrosis of knee joint, left     Plan: Return in 2 months no x-ray needed on return.  Follow-Up Instructions: No follow-ups on file.   Orders:  No orders of the defined types were placed in this encounter.  No orders of the defined types were placed in this encounter.   Imaging: No results found.  PMFS History: Patient Active Problem List   Diagnosis Date Noted  . Arthrofibrosis of knee joint, left 06/24/2020  . S/P total knee arthroplasty, left 06/02/2020  . Noncompliance with CPAP treatment 09/24/2018  . A-fib ([place]) 08/31/2018  . CHF (congestive heart failure) ([place]) 08/31/2018  . Acute pyelonephritis 08/31/2018  . Sepsis secondary to UTI ([place]) 08/31/2018  . Leucocytosis 08/31/2018  . Hypothyroidism 08/31/2018  . CKD (chronic kidney  disease) stage 3, GFR 30-59 ml/min (HCC) 08/31/2018  . HTN (hypertension) 08/31/2018  . HLD (hyperlipidemia) 08/31/2018  . Anxiety and depression 08/31/2018  . Obesity 08/31/2018  . Sepsis ([place]) 08/31/2018  . Back pain 08/31/2018  . Obstructive sleep apnea treated with continuous positive airway pressure (CPAP) 08/13/2018   Past Medical History:  Diagnosis Date  . Anemia   . Arthritis    per patient, in left and right knee and both hands  . Atrial fibrillation ([place])   . Bilateral breast cysts   . CHF (congestive heart failure) ([place])   . Chronic kidney disease    stage 3  . Depression with anxiety   . DM (diabetes mellitus) ([place])   . Esophageal reflux   . History of hiatal hernia   . HTN (hypertension)   . Hyperlipemia   . Hyperthyroidism   . Hypothyroidism   . INR (international normal ratio) abnormal   . MRSA (methicillin resistant staph aureus) culture positive   . Panuveitis   . Pneumonia   . PTSD (post-traumatic stress disorder)   . Sleep apnea    per patient has CPAP, wears it at night sometimes    Family History  Problem Relation Age of Onset  . Alcoholism Father   . Arthritis Father   . Hypertension Father        sister, mother  .  Diabetes Mellitus I Mother        sister  . Renal Disease Mother   . Stroke Mother   . Epilepsy Brother     Past Surgical History:  Procedure Laterality Date  . ABDOMINAL HYSTERECTOMY    . BREAST BIOPSY    . CATARACT EXTRACTION  2018  . CESAREAN SECTION    . DENTAL SURGERY    . EYE SURGERY    . FOOT SURGERY    . HERNIA REPAIR    . KNEE ARTHROSCOPY Left 07/16/2020   Procedure: ARTHROSCOPY KNEE;  Surgeon: [name], MD;  Location: [place];  Service: Orthopedics;  Laterality: Left;  . KNEE CLOSED REDUCTION Left 07/16/2020   Procedure: LEFT KNEE MANIPULATION UNDER ANESTHESIA;  Surgeon: [name], MD;  Location: [place];  Service: Orthopedics;  Laterality: Left;  . KNEE SURGERY    . TONSILLECTOMY AND ADENOIDECTOMY    .  TOTAL KNEE ARTHROPLASTY Left 04/21/2020   Procedure: LEFT TOTAL KNEE ARTHROPLASTY;  Surgeon: [name], MD;  Location: [place];  Service: Orthopedics;  Laterality: Left;   Social History   Occupational History  . Not on file  Tobacco Use  . Smoking status: Former Smoker    Packs/day: 0.25    Years: 34.00    Pack years: 8.50    Types: Cigarettes    Quit date: 12/15/2003    Years since quitting: 16.7  . Smokeless tobacco: Former Systems developer    Types: Snuff    Quit date: 03/2020  . Tobacco comment: 04/15/2020: per patient 1-2 times a day, trying to stop taking it  Vaping Use  . Vaping Use: Never used  Substance and Sexual Activity  . Alcohol use: Not Currently  . Drug use: Never  . Sexual activity: Not on file

## 2020-11-23 ENCOUNTER — Other Ambulatory Visit: Payer: Self-pay

## 2020-11-23 ENCOUNTER — Encounter: Payer: Self-pay | Admitting: Orthopaedic Surgery

## 2020-11-23 ENCOUNTER — Ambulatory Visit (INDEPENDENT_AMBULATORY_CARE_PROVIDER_SITE_OTHER): Payer: Medicare Other | Admitting: Orthopaedic Surgery

## 2020-11-23 VITALS — BP 136/84 | HR 76 | Ht 60.0 in | Wt 191.0 lb

## 2020-11-23 DIAGNOSIS — M24662 Ankylosis, left knee: Secondary | ICD-10-CM | POA: Diagnosis not present

## 2020-11-23 MED ORDER — HYDROCODONE-ACETAMINOPHEN 5-325 MG PO TABS: 1.0000 | ORAL_TABLET | Freq: Four times a day (QID) | ORAL | 0 refills | Status: DC | PRN

## 2020-11-23 NOTE — Progress Notes (Signed)
Office Visit Note   Patient: Jennifer Foley           Date of Birth: 1956-08-20           MRN: [id number] Visit Date: 11/23/2020              Requested by: Bartholome Bill, MD [place],  [place] [address] PCP: Bartholome Bill, MD   Assessment & Plan: Visit Diagnoses:  1. Arthrofibrosis of knee joint, left     Plan: We again went over across ankle flexion exercises which she needs to continue to do.  She states she has problems when she gets some pain and wants to stop working on flexion immediately.  She'll continue to work on quad strengthening exercises.  She has made progress and I'll check her again for final visit and 2 months.  Final prescription Norco given 30 tablets no refills.  Follow-Up Instructions: Return in about 2 months (around 01/21/2021).   Orders:  No orders of the defined types were placed in this encounter.  Meds ordered this encounter  Medications   HYDROcodone-acetaminophen (NORCO/VICODIN) 5-325 MG tablet    Sig: Take 1 tablet by mouth every 6 (six) hours as needed for moderate pain.    Dispense:  30 tablet    Refill:  0    Post knee manipulation      Procedures: No procedures performed   Clinical Data: No additional findings.   Subjective: Chief Complaint  Patient presents with   Left Knee - Follow-up    07/16/2020 left knee MUA, left knee arthroscopy    HPI post knee arthroscopy manipulation under anesthesia arthroscopic debridement postop knee arthroplasty for arthrofibrosis.  She is flexing to 90 degrees.  She can ambulate without a cane and is not limping still has mild quad weakness.  She is requesting one final prescription for Norco for pain.  Prior to her total knee arthroplasty she had been on tramadol for greater than 6 months by her PCP.  Review of Systems 14 point update unchanged.  Of note is a history of heart failure, sepsis, left total knee arthroplasty.  All the systems noncontributory  to HPI.   Objective: Vital Signs: BP 136/84    Pulse 76    Ht 5' (1.524 m)    Wt 191 lb (86.6 kg)    BMI 37.30 kg/m   Physical Exam Constitutional:      Appearance: She is well-developed.  HENT:     Head: Normocephalic.     Right Ear: External ear normal.     Left Ear: External ear normal.  Eyes:     Pupils: Pupils are equal, round, and reactive to light.  Neck:     Thyroid: No thyromegaly.     Trachea: No tracheal deviation.  Cardiovascular:     Rate and Rhythm: Normal rate.  Pulmonary:     Effort: Pulmonary effort is normal.  Abdominal:     Palpations: Abdomen is soft.  Skin:    General: Skin is warm and dry.  Neurological:     Mental Status: She is alert and oriented to person, place, and time.  Psychiatric:        Mood and Affect: Mood and affect normal.        Behavior: Behavior normal.     Ortho Exam patient is ambulating without a limp knee reaches full extension still has some quad weakness on the left versus right.  She can flex  to 90 degrees. Specialty Comments:  No specialty comments available.  Imaging: No results found.   PMFS History: Patient Active Problem List   Diagnosis Date Noted   Arthrofibrosis of knee joint, left 06/24/2020   S/P total knee arthroplasty, left 06/02/2020   Noncompliance with CPAP treatment 09/24/2018   A-fib ([facility]) 08/31/2018   CHF (congestive heart failure) ([facility]) 08/31/2018   Acute pyelonephritis 08/31/2018   Sepsis secondary to UTI ([facility]) 08/31/2018   Leucocytosis 08/31/2018   Hypothyroidism 08/31/2018   CKD (chronic kidney disease) stage 3, GFR 30-59 ml/min ([facility]) 08/31/2018   HTN (hypertension) 08/31/2018   HLD (hyperlipidemia) 08/31/2018   Anxiety and depression 08/31/2018   Obesity 08/31/2018   Sepsis ([facility]) 08/31/2018   Back pain 08/31/2018   Obstructive sleep apnea treated with continuous positive airway pressure (CPAP) 08/13/2018   Past Medical History:  Diagnosis Date   Anemia     Arthritis    per patient, in left and right knee and both hands   Atrial fibrillation (H[facility])    Bilateral breast cysts    CHF (congestive heart failure) ([facility])    Chronic kidney disease    stage 3   Depression with anxiety    DM (diabetes mellitus) (H[facility])    Esophageal reflux    History of hiatal hernia    HTN (hypertension)    Hyperlipemia    Hyperthyroidism    Hypothyroidism    INR (international normal ratio) abnormal    MRSA (methicillin resistant staph aureus) culture positive    Panuveitis    Pneumonia    PTSD (post-traumatic stress disorder)    Sleep apnea    per patient has CPAP, wears it at night sometimes    Family History  Problem Relation Age of Onset   Alcoholism Father    Arthritis Father    Hypertension Father        sister, mother   Diabetes Mellitus I Mother        sister   Renal Disease Mother    Stroke Mother    Epilepsy Brother     Past Surgical History:  Procedure Laterality Date   ABDOMINAL HYSTERECTOMY     BREAST BIOPSY     CATARACT EXTRACTION  2018   CESAREAN SECTION     DENTAL SURGERY     EYE SURGERY     FOOT SURGERY     HERNIA REPAIR     KNEE ARTHROSCOPY Left 07/16/2020   Procedure: ARTHROSCOPY KNEE;  Surgeon: [name], MD;  Location: [facility];  Service: Orthopedics;  Laterality: Left;   KNEE CLOSED REDUCTION Left 07/16/2020   Procedure: LEFT KNEE MANIPULATION UNDER ANESTHESIA;  Surgeon: [name], MD;  Location: [facility];  Service: Orthopedics;  Laterality: Left;   KNEE SURGERY     TONSILLECTOMY AND ADENOIDECTOMY     TOTAL KNEE ARTHROPLASTY Left 04/21/2020   Procedure: LEFT TOTAL KNEE ARTHROPLASTY;  Surgeon: [name], MD;  Location: [facility];  Service: Orthopedics;  Laterality: Left;   Social History   Occupational History   Not on file  Tobacco Use   Smoking status: Former Smoker    Packs/day: 0.25    Years: 34.00    Pack years: 8.50    Types: Cigarettes    Quit date: 12/15/2003    Years  since quitting: 16.9   Smokeless tobacco: Former Systems developer    Types: Snuff    Quit date: 03/2020   Tobacco comment: 04/15/2020: per patient 1-2 times a day, trying  to stop taking it  Vaping Use   Vaping Use: Never used  Substance and Sexual Activity   Alcohol use: Not Currently   Drug use: Never   Sexual activity: Not on file

## 2021-01-21 ENCOUNTER — Ambulatory Visit: Payer: Medicare Other | Admitting: Orthopaedic Surgery

## 2021-03-29 ENCOUNTER — Ambulatory Visit (INDEPENDENT_AMBULATORY_CARE_PROVIDER_SITE_OTHER): Payer: Medicare Other | Admitting: Orthopaedic Surgery

## 2021-03-29 ENCOUNTER — Encounter: Payer: Self-pay | Admitting: Orthopaedic Surgery

## 2021-03-29 VITALS — BP 103/69 | HR 73 | Ht 60.0 in | Wt 192.0 lb

## 2021-03-29 DIAGNOSIS — M7541 Impingement syndrome of right shoulder: Secondary | ICD-10-CM | POA: Diagnosis not present

## 2021-03-29 DIAGNOSIS — M24662 Ankylosis, left knee: Secondary | ICD-10-CM | POA: Diagnosis not present

## 2021-03-29 MED ORDER — BUPIVACAINE HCL 0.25 % IJ SOLN
4.0000 mL | INTRAMUSCULAR | Status: AC | PRN
  Administered 2021-03-29: 4 mL via INTRA_ARTICULAR

## 2021-03-29 MED ORDER — METHYLPREDNISOLONE ACETATE 40 MG/ML IJ SUSP
40.0000 mg | INTRAMUSCULAR | Status: AC | PRN
  Administered 2021-03-29: 40 mg via INTRA_ARTICULAR

## 2021-03-29 MED ORDER — LIDOCAINE HCL 1 % IJ SOLN
0.5000 mL | INTRAMUSCULAR | Status: AC | PRN
  Administered 2021-03-29: .5 mL

## 2021-03-29 NOTE — Progress Notes (Signed)
Office Visit Note   Patient: Jennifer Foley           Date of Birth: January 10, 1956           MRN: [id number] Visit Date: 03/29/2021              Requested by: Bartholome Bill, MD [place],  [place] [address] PCP: Bartholome Bill, MD   Assessment & Plan: Visit Diagnoses:  1. Arthrofibrosis of knee joint, left   2. Impingement syndrome of right shoulder     Plan: Right shoulder injection performed.  She has persistent symptoms she can call us in a couple months we can proceed with a repeat MRI imaging in case her tendinosis is progressed to a full-thickness tear.  Hopefully should get good relief with the injection.  Follow-Up Instructions: No follow-ups on file.   Orders:  No orders of the defined types were placed in this encounter.  No orders of the defined types were placed in this encounter.     Procedures: Large Joint Inj: R subacromial bursa on 03/29/2021 2:15 PM Indications: pain Details: 22 G 1.5 in needle  Arthrogram: No  Medications: 4 mL bupivacaine 0.25 %; 40 mg methylPREDNISolone acetate 40 MG/ML; 0.5 mL lidocaine 1 % Outcome: tolerated well, no immediate complications Procedure, treatment alternatives, risks and benefits explained, specific risks discussed. Consent was given by the patient. Immediately prior to procedure a time out was called to verify the correct patient, procedure, equipment, support staff and site/side marked as required. Patient was prepped and draped in the usual sterile fashion.       Clinical Data: No additional findings.   Subjective: Chief Complaint  Patient presents with  . Right Shoulder - Pain  . Left Knee - Follow-up    07/16/2020 left knee MUA,  left knee arthroscopy    HPI 65 year old female post left total knee arthroplasty with arthrofibrosis requiring manipulation and knee arthroscopy 07/16/2020.  She is now ambulatory without a limp can flex her knee past 90 degrees.  She states  her knee feels great without pain and she is very happy with the surgical result and she has worked hard to get knee flexion and maintain it post knee arthroscopy and manipulation with synovectomy.  Further problems in her right shoulder.  Previous MRI 2020 showed supraspinatus tendinopathy without full-thickness tear.  With her weak knee and use arm for self up in the past she has had increased symptoms.  At 1 point she was scheduled for surgery by Dr. Lorre Nick.  Patient states she is not ready to consider any surgery at this point since she is just getting over her total knee arthroplasty.  She has had history of heart failure stage III kidney disease, atrial fibs, sleep apnea on CPAP.  Review of Systems all systems noncontributory.   Objective: Vital Signs: BP 103/69   Pulse 73   Ht 5' (1.524 m)   Wt 192 lb (87.1 kg)   BMI 37.50 kg/m   Physical Exam Constitutional:      Appearance: She is well-developed.  HENT:     Head: Normocephalic.     Right Ear: External ear normal.     Left Ear: External ear normal.  Eyes:     Pupils: Pupils are equal, round, and reactive to light.  Neck:     Thyroid: No thyromegaly.     Trachea: No tracheal deviation.  Cardiovascular:     Rate and Rhythm: Normal  rate.  Pulmonary:     Effort: Pulmonary effort is normal.  Abdominal:     Palpations: Abdomen is soft.  Skin:    General: Skin is warm and dry.  Neurological:     Mental Status: She is alert and oriented to person, place, and time.  Psychiatric:        Behavior: Behavior normal.     Ortho Exam right shoulder positive Neer negative Hawkins.  Reflexes are intact good cervical range of motion.  Left shoulder negative impingement negative Hawkins negative Neer. Specialty Comments:  No specialty comments available.  Imaging: No results found.   PMFS History: Patient Active Problem List   Diagnosis Date Noted  . Impingement syndrome of right shoulder 03/29/2021  . Arthrofibrosis of knee  joint, left 06/24/2020  . S/P total knee arthroplasty, left 06/02/2020  . Noncompliance with CPAP treatment 09/24/2018  . A-fib ([facility]) 08/31/2018  . CHF (congestive heart failure) ([facility]) 08/31/2018  . Acute pyelonephritis 08/31/2018  . Sepsis secondary to UTI ([facility]) 08/31/2018  . Leucocytosis 08/31/2018  . Hypothyroidism 08/31/2018  . CKD (chronic kidney disease) stage 3, GFR 30-59 ml/min (H[facility]) 08/31/2018  . HTN (hypertension) 08/31/2018  . HLD (hyperlipidemia) 08/31/2018  . Anxiety and depression 08/31/2018  . Obesity 08/31/2018  . Sepsis ([facility]) 08/31/2018  . Back pain 08/31/2018  . Obstructive sleep apnea treated with continuous positive airway pressure (CPAP) 08/13/2018   Past Medical History:  Diagnosis Date  . Anemia   . Arthritis    per patient, in left and right knee and both hands  . Atrial fibrillation ([facility])   . Bilateral breast cysts   . CHF (congestive heart failure) ([facility])   . Chronic kidney disease    stage 3  . Depression with anxiety   . DM (diabetes mellitus) ([facility])   . Esophageal reflux   . History of hiatal hernia   . HTN (hypertension)   . Hyperlipemia   . Hyperthyroidism   . Hypothyroidism   . INR (international normal ratio) abnormal   . MRSA (methicillin resistant staph aureus) culture positive   . Panuveitis   . Pneumonia   . PTSD (post-traumatic stress disorder)   . Sleep apnea    per patient has CPAP, wears it at night sometimes    Family History  Problem Relation Age of Onset  . Alcoholism Father   . Arthritis Father   . Hypertension Father        sister, mother  . Diabetes Mellitus I Mother        sister  . Renal Disease Mother   . Stroke Mother   . Epilepsy Brother     Past Surgical History:  Procedure Laterality Date  . ABDOMINAL HYSTERECTOMY    . BREAST BIOPSY    . CATARACT EXTRACTION  2018  . CESAREAN SECTION    . DENTAL SURGERY    . EYE SURGERY    . FOOT SURGERY    . HERNIA REPAIR    . KNEE ARTHROSCOPY Left 07/16/2020    Procedure: ARTHROSCOPY KNEE;  Surgeon: [name], MD;  Location: [facility];  Service: Orthopedics;  Laterality: Left;  . KNEE CLOSED REDUCTION Left 07/16/2020   Procedure: LEFT KNEE MANIPULATION UNDER ANESTHESIA;  Surgeon: [name], MD;  Location: [facility];  Service: Orthopedics;  Laterality: Left;  . KNEE SURGERY    . TONSILLECTOMY AND ADENOIDECTOMY    . TOTAL KNEE ARTHROPLASTY Left 04/21/2020   Procedure: LEFT TOTAL KNEE ARTHROPLASTY;  Surgeon: Lorin Mercy,  Thana Farr, MD;  Location: [facility];  Service: Orthopedics;  Laterality: Left;   Social History   Occupational History  . Not on file  Tobacco Use  . Smoking status: Former Smoker    Packs/day: 0.25    Years: 34.00    Pack years: 8.50    Types: Cigarettes    Quit date: 12/15/2003    Years since quitting: 17.2  . Smokeless tobacco: Former Systems developer    Types: Snuff    Quit date: 03/2020  . Tobacco comment: 04/15/2020: per patient 1-2 times a day, trying to stop taking it  Vaping Use  . Vaping Use: Never used  Substance and Sexual Activity  . Alcohol use: Not Currently  . Drug use: Never  . Sexual activity: Not on file

## 2021-05-23 ENCOUNTER — Emergency Department (HOSPITAL_COMMUNITY)
Admission: EM | Admit: 2021-05-23 | Discharge: 2021-05-23 | Disposition: A | Discharge status: Home / Self Care | Payer: Medicare Other | Attending: Emergency Medicine | Admitting: Emergency Medicine

## 2021-05-23 ENCOUNTER — Other Ambulatory Visit: Payer: Self-pay

## 2021-05-23 ENCOUNTER — Encounter (HOSPITAL_COMMUNITY): Payer: Self-pay | Admitting: *Deleted

## 2021-05-23 DIAGNOSIS — E039 Hypothyroidism, unspecified: Secondary | ICD-10-CM | POA: Diagnosis not present

## 2021-05-23 DIAGNOSIS — I130 Hypertensive heart and chronic kidney disease with heart failure and stage 1 through stage 4 chronic kidney disease, or unspecified chronic kidney disease: Secondary | ICD-10-CM | POA: Diagnosis not present

## 2021-05-23 DIAGNOSIS — E1122 Type 2 diabetes mellitus with diabetic chronic kidney disease: Secondary | ICD-10-CM | POA: Diagnosis not present

## 2021-05-23 DIAGNOSIS — N183 Chronic kidney disease, stage 3 unspecified: Secondary | ICD-10-CM | POA: Diagnosis not present

## 2021-05-23 DIAGNOSIS — M5412 Radiculopathy, cervical region: Secondary | ICD-10-CM | POA: Insufficient documentation

## 2021-05-23 DIAGNOSIS — Z7901 Long term (current) use of anticoagulants: Secondary | ICD-10-CM | POA: Insufficient documentation

## 2021-05-23 DIAGNOSIS — Z87891 Personal history of nicotine dependence: Secondary | ICD-10-CM | POA: Diagnosis not present

## 2021-05-23 DIAGNOSIS — I509 Heart failure, unspecified: Secondary | ICD-10-CM | POA: Diagnosis not present

## 2021-05-23 DIAGNOSIS — Z96652 Presence of left artificial knee joint: Secondary | ICD-10-CM | POA: Insufficient documentation

## 2021-05-23 DIAGNOSIS — Z79899 Other long term (current) drug therapy: Secondary | ICD-10-CM | POA: Insufficient documentation

## 2021-05-23 DIAGNOSIS — M542 Cervicalgia: Secondary | ICD-10-CM | POA: Diagnosis present

## 2021-05-23 MED ORDER — METHOCARBAMOL 500 MG PO TABS
500.0000 mg | ORAL_TABLET | Freq: Once | ORAL | Status: AC
  Administered 2021-05-23: 500 mg via ORAL
  Filled 2021-05-23: qty 1

## 2021-05-23 MED ORDER — HYDROMORPHONE HCL 1 MG/ML IJ SOLN
0.5000 mg | Freq: Once | INTRAMUSCULAR | Status: AC
  Administered 2021-05-23: 0.5 mg via INTRAMUSCULAR
  Filled 2021-05-23: qty 1

## 2021-05-23 MED ORDER — KETOROLAC TROMETHAMINE 60 MG/2ML IM SOLN
60.0000 mg | Freq: Once | INTRAMUSCULAR | Status: AC
  Administered 2021-05-23: 60 mg via INTRAMUSCULAR
  Filled 2021-05-23: qty 2

## 2021-05-23 MED ORDER — HYDROCODONE-ACETAMINOPHEN 5-325 MG PO TABS: 1.0000 | ORAL_TABLET | Freq: Four times a day (QID) | ORAL | 0 refills | Status: DC | PRN

## 2021-05-23 MED ORDER — METHOCARBAMOL 500 MG PO TABS: 500.0000 mg | ORAL_TABLET | Freq: Four times a day (QID) | ORAL | 0 refills | Status: DC | PRN

## 2021-05-23 MED ORDER — METHYLPREDNISOLONE 4 MG PO TBPK: ORAL_TABLET | ORAL | 0 refills | Status: DC

## 2021-05-23 NOTE — ED Triage Notes (Signed)
Patient presents to ed c/o left arm pain onset Friday states the pain goes into her back denies injury , states she slept wrong Wed. Night and woke up with a stiff neck. Using tyenol without relief.

## 2021-05-23 NOTE — ED Notes (Signed)
Pt verbalizes understanding of discharge instructions. Opportunity for questions and answers were provided. Pt discharged from the ED.   ?

## 2021-05-23 NOTE — ED Notes (Signed)
0.5 mL (0.5 mg) hydromorphone witnessed waste with this RN and Adella Hare., charge RN. Pt discharged from pyxis prior to this RN wasting medication in system.

## 2021-05-23 NOTE — ED Provider Notes (Signed)
[facility] EMERGENCY DEPARTMENT Provider Note   CSN: [id number] Arrival date & time: 05/23/21  0456     History Chief Complaint  Patient presents with   Arm Injury   Back Pain    Jennifer Foley is a 65 y.o. female.  HPI Patient reports that she slept wrong 6 days ago and woke up with a crick in her neck.  She reports for about 3 days she was trying to stretch and do some exercises to relieve that.  She reports that from there about 3 days ago now she started getting pain radiating into her left shoulder and down to the hand occasionally.  She reports she is getting occasional pain coming and going from the right arm as well but that is been more of a baseline problem.  She reports the pain in the left arm is much worse if she turns her head toward the left and then it can go all the way down to her hand and ache in her forearm as well.  She tried a dose of extra strength Tylenol yesterday and got a little bit of relief but its persisted and kept her up all night now.  She denies headache confusion, no chest pain no shortness of breath.  No difficulty walking.  Patient reports diet-controlled diabetes with blood sugars ranging in the low 100s.    Past Medical History:  Diagnosis Date   Anemia    Arthritis    per patient, in left and right knee and both hands   Atrial fibrillation ([facility])    Bilateral breast cysts    CHF (congestive heart failure) (H[facility])    Chronic kidney disease    stage 3   Depression with anxiety    DM (diabetes mellitus) ([facility])    Esophageal reflux    History of hiatal hernia    HTN (hypertension)    Hyperlipemia    Hyperthyroidism    Hypothyroidism    INR (international normal ratio) abnormal    MRSA (methicillin resistant staph aureus) culture positive    Panuveitis    Pneumonia    PTSD (post-traumatic stress disorder)    Sleep apnea    per patient has CPAP, wears it at night sometimes    Patient Active Problem List   Diagnosis  Date Noted   Impingement syndrome of right shoulder 03/29/2021   Arthrofibrosis of knee joint, left 06/24/2020   S/P total knee arthroplasty, left 06/02/2020   Noncompliance with CPAP treatment 09/24/2018   A-fib ([facility]) 08/31/2018   CHF (congestive heart failure) ([facility]) 08/31/2018   Acute pyelonephritis 08/31/2018   Sepsis secondary to UTI ([facility]) 08/31/2018   Leucocytosis 08/31/2018   Hypothyroidism 08/31/2018   CKD (chronic kidney disease) stage 3, GFR 30-59 ml/min ([facility]) 08/31/2018   HTN (hypertension) 08/31/2018   HLD (hyperlipidemia) 08/31/2018   Anxiety and depression 08/31/2018   Obesity 08/31/2018   Sepsis ([facility]) 08/31/2018   Back pain 08/31/2018   Obstructive sleep apnea treated with continuous positive airway pressure (CPAP) 08/13/2018    Past Surgical History:  Procedure Laterality Date   ABDOMINAL HYSTERECTOMY     BREAST BIOPSY     CATARACT EXTRACTION  2018   CESAREAN SECTION     DENTAL SURGERY     EYE SURGERY     FOOT SURGERY     HERNIA REPAIR     KNEE ARTHROSCOPY Left 07/16/2020   Procedure: ARTHROSCOPY KNEE;  Surgeon: [name], MD;  Location: [facility];  Service: Orthopedics;  Laterality: Left;   KNEE CLOSED REDUCTION Left 07/16/2020   Procedure: LEFT KNEE MANIPULATION UNDER ANESTHESIA;  Surgeon: [name], MD;  Location: [facility];  Service: Orthopedics;  Laterality: Left;   KNEE SURGERY     TONSILLECTOMY AND ADENOIDECTOMY     TOTAL KNEE ARTHROPLASTY Left 04/21/2020   Procedure: LEFT TOTAL KNEE ARTHROPLASTY;  Surgeon: [name], MD;  Location: [facility];  Service: Orthopedics;  Laterality: Left;     OB History   No obstetric history on file.     Family History  Problem Relation Age of Onset   Alcoholism Father    Arthritis Father    Hypertension Father        sister, mother   Diabetes Mellitus I Mother        sister   Renal Disease Mother    Stroke Mother    Epilepsy Brother     Social History   Tobacco Use   Smoking status: Former    Packs/day:  0.25    Years: 34.00    Pack years: 8.50    Types: Cigarettes    Quit date: 12/15/2003    Years since quitting: 17.4   Smokeless tobacco: Former    Types: Snuff    Quit date: 03/2020   Tobacco comments:    04/15/2020: per patient 1-2 times a day, trying to stop taking it  Vaping Use   Vaping Use: Never used  Substance Use Topics   Alcohol use: Not Currently   Drug use: Never    Home Medications Prior to Admission medications   Medication Sig Start Date End Date Taking? Authorizing Provider  Adalimumab 40 MG/0.8ML PNKT Inject 40 mg into the skin every 14 (fourteen) days.  01/31/18   [provider]  azaTHIOprine (IMURAN) 50 MG tablet Take 50 mg by mouth 2 (two) times daily.  09/17/18   [provider]  budesonide-formoterol (SYMBICORT) 160-4.5 MCG/ACT inhaler Inhale 2 puffs into the lungs daily as needed (shortness of breath).     [provider]  buPROPion (WELLBUTRIN XL) 150 MG 24 hr tablet Take 150 mg by mouth daily. 06/18/18   [provider]  busPIRone (BUSPAR) 10 MG tablet Take 10 mg by mouth 2 (two) times daily.     [provider]  ELIQUIS 2.5 MG TABS tablet Take 2.5 mg by mouth 2 (two) times daily.  06/07/20   [provider]  fluticasone (FLONASE) 50 MCG/ACT nasal spray Place 2 sprays into both nostrils daily as needed for allergies.     [provider]  furosemide (LASIX) 40 MG tablet Take 40 mg by mouth daily.    [provider]  HYDROcodone-acetaminophen (NORCO/VICODIN) 5-325 MG tablet Take 1 tablet by mouth every 6 (six) hours as needed for moderate pain. Patient not taking: No sig reported 08/25/20   [name], MD  HYDROcodone-acetaminophen (NORCO/VICODIN) 5-325 MG tablet Take 1 tablet by mouth every 6 (six) hours as needed for moderate pain. Patient not taking: Reported on 03/29/2021 11/23/20   [name], MD  ipratropium (ATROVENT) 0.03 % nasal spray Place 1 spray into the nose daily as needed  (rhinitis).    [provider]  levalbuterol Penne Lash HFA) 45 MCG/ACT inhaler Inhale 2 puffs into the lungs every 4 (four) hours as needed for wheezing or shortness of breath.    [provider]  levothyroxine (SYNTHROID, LEVOTHROID) 50 MCG tablet Take 50 mcg by mouth daily before breakfast.    [provider]  methocarbamol (ROBAXIN) 500 MG tablet Take 1 tablet (500 mg total) by mouth every 6 (six) hours as needed for muscle spasms. Patient not taking: No sig reported 04/23/20   Lanae Crumbly, PA-C  metoprolol succinate (TOPROL-XL) 25 MG 24 hr tablet Take 25 mg by mouth daily.    [provider]  omeprazole (PRILOSEC) 20 MG capsule Take 20 mg by mouth daily.    [provider]  potassium chloride (K-DUR,KLOR-CON) 10 MEQ tablet Take 10 mEq by mouth daily. 01/02/18   [provider]  prednisoLONE acetate (PRED FORTE) 1 % ophthalmic suspension Place 1 drop into the right eye in the morning and at bedtime.     [provider]  rosuvastatin (CRESTOR) 5 MG tablet Take 5 mg by mouth daily. 05/07/20   [provider]    Allergies    Contrast media [iodinated diagnostic agents], Doxycycline, Albuterol, Albuterol sulfate, Amoxicillin, Codeine, and Erythromycin base  Review of Systems   Review of Systems 10 systems reviewed negative except as per HPI Physical Exam Updated Vital Signs BP (!) 147/75   Pulse (!) 57   Temp 97.8 F (36.6 C) (Oral)   Resp 12   Ht 5' (1.524 m)   Wt 86.2 kg   SpO2 98%   BMI 37.11 kg/m   Physical Exam Constitutional:      Comments: Alert nontoxic normal mental status normal respiratory status  HENT:     Head: Normocephalic and atraumatic.     Mouth/Throat:     Pharynx: Oropharynx is clear.  Eyes:     Extraocular Movements: Extraocular movements intact.  Neck:     Comments: Reproducible pain to palpation of the Paraspinous cervical muscle bodies and trapezius bilaterally but left greater than  right.  Pain elicited by turning head to the left because radiation of pain into the arm. Cardiovascular:     Rate and Rhythm: Normal rate and regular rhythm.  Pulmonary:     Effort: Pulmonary effort is normal.     Breath sounds: Normal breath sounds.  Abdominal:     General: There is no distension.     Palpations: Abdomen is soft.  Musculoskeletal:     Comments: Reproducible paraspinous pain as outlined.  No swelling of either upper extremity.  Radial pulses 2+ and strong.  Hands are warm and dry without any edema.  No lower extremity edema calves symmetric.  Feet in good condition.  Skin:    General: Skin is warm and dry.  Neurological:     Comments: 5\5 motor strength upper extremity testing 5\5 motor strength lower extremity testing.  Sensation intact light touch throughout.  Psychiatric:        Mood and Affect: Mood normal.    ED Results / Procedures / Treatments   Labs (all labs ordered are listed, but only abnormal results are displayed) Labs Reviewed - No data to display  EKG None  Radiology No results found.  Procedures Procedures   Medications Ordered in ED Medications  ketorolac (TORADOL) injection 60 mg (has no administration in time range)  methocarbamol (ROBAXIN) tablet 500 mg (has no administration in time range)  HYDROmorphone (DILAUDID) injection 0.5 mg (has no administration in time range)    ED Course  I have reviewed the triage vital signs and the nursing notes.  Pertinent labs & imaging results that were available during my care of the patient were reviewed by me and considered in my medical decision making (see chart for details).  MDM Rules/Calculators/A&P                           Patient presents with findings consistent with cervical radiculopathy.  Patient had some neck pain after sleeping wrong almost a week ago.  She was trying stretches and exercises for that and now has radicular pain.  Is reproducible by movements.  She is  neurologically intact with good motor function and sensation intact.  At this time stable for outpatient treatment.  Will try a Solu-Medrol Dosepak.  Patient describes blood sugars controlled with diet.  Patient is encouraged to be compliant with diet and monitor blood sugars at home while taking Solu-Medrol.  Blood sugars are elevating she is to contact her doctor immediately.  Patient is to follow-up with orthopedics for recheck.  Return precautions reviewed. Final Clinical Impression(s) / ED Diagnoses Final diagnoses:  Cervical radiculopathy    Rx / DC Orders ED Discharge Orders     None        Charlesetta Shanks, MD 05/23/21 [phone]

## 2021-05-23 NOTE — Discharge Instructions (Addendum)
1.  Start your Medrol Dosepak today as prescribed.  Monitor your blood sugars and carefully follow diabetic diet.  If your blood sugars are starting to elevate, contact your family doctor for monitoring. 2.  You may take Robaxin as prescribed for muscle relaxer.  You may take Vicodin 1 to 2 tablets every 6 hours for additional pain control. 3.  Schedule follow-up appointment with your orthopedic doctor for further evaluation of neck and arm pain. 4.  Return to the emergency department if you get weakness in your arms, difficulty using them, any difficulty walking or other concerning symptoms.

## 2021-05-23 NOTE — ED Provider Notes (Signed)
Emergency Medicine Provider Triage Evaluation Note  Jennifer Foley , a 65 y.o. female  was evaluated in triage.  Pt complains of with a chief complaint of left arm pain.  She states that when she bends her neck, she has pain that radiates down her left arm.  She thinks that she may have slept on her arm or neck wrong earlier this week.  She has tried OTC medications without relief..  Review of Systems  Positive: Left arm pain, neck pain Negative: Cough, shortness of breath  Physical Exam  BP (!) 146/70 (BP Location: Right Arm)   Pulse 70   Temp 97.8 F (36.6 C) (Oral)   Resp 18   Ht 5' (1.524 m)   Wt 86.2 kg   SpO2 99%   BMI 37.11 kg/m  Gen:   Awake, no distress   Resp:  Normal effort  MSK:   Moves extremities without difficulty  Other:  Normal distal pulses  Medical Decision Making  Medically screening exam initiated at 5:11 AM.  Appropriate orders placed.  [name] was informed that the remainder of the evaluation will be completed by another provider, this initial triage assessment does not replace that evaluation, and the importance of remaining in the ED until their evaluation is complete.  Left arm pain and neck pain, question cervical radiculopathy   Montine Circle, PA-C 05/23/21 0513    Orpah Greek, MD 05/23/21 [phone]

## 2021-05-26 ENCOUNTER — Ambulatory Visit: Payer: Medicare Other | Admitting: Surgery

## 2021-05-30 ENCOUNTER — Encounter (HOSPITAL_COMMUNITY): Payer: Self-pay | Admitting: Emergency Medicine

## 2021-05-30 ENCOUNTER — Other Ambulatory Visit: Payer: Self-pay

## 2021-05-30 ENCOUNTER — Emergency Department (HOSPITAL_COMMUNITY)
Admission: EM | Admit: 2021-05-30 | Discharge: 2021-05-30 | Disposition: A | Discharge status: Home / Self Care | Payer: Medicare Other | Attending: Emergency Medicine | Admitting: Emergency Medicine

## 2021-05-30 DIAGNOSIS — I4891 Unspecified atrial fibrillation: Secondary | ICD-10-CM | POA: Insufficient documentation

## 2021-05-30 DIAGNOSIS — E119 Type 2 diabetes mellitus without complications: Secondary | ICD-10-CM | POA: Diagnosis not present

## 2021-05-30 DIAGNOSIS — Z79899 Other long term (current) drug therapy: Secondary | ICD-10-CM | POA: Insufficient documentation

## 2021-05-30 DIAGNOSIS — I130 Hypertensive heart and chronic kidney disease with heart failure and stage 1 through stage 4 chronic kidney disease, or unspecified chronic kidney disease: Secondary | ICD-10-CM | POA: Insufficient documentation

## 2021-05-30 DIAGNOSIS — I509 Heart failure, unspecified: Secondary | ICD-10-CM | POA: Insufficient documentation

## 2021-05-30 DIAGNOSIS — M62830 Muscle spasm of back: Secondary | ICD-10-CM | POA: Diagnosis not present

## 2021-05-30 DIAGNOSIS — Z96652 Presence of left artificial knee joint: Secondary | ICD-10-CM | POA: Insufficient documentation

## 2021-05-30 DIAGNOSIS — Z7901 Long term (current) use of anticoagulants: Secondary | ICD-10-CM | POA: Diagnosis not present

## 2021-05-30 DIAGNOSIS — N183 Chronic kidney disease, stage 3 unspecified: Secondary | ICD-10-CM | POA: Diagnosis not present

## 2021-05-30 DIAGNOSIS — Z87891 Personal history of nicotine dependence: Secondary | ICD-10-CM | POA: Diagnosis not present

## 2021-05-30 DIAGNOSIS — E039 Hypothyroidism, unspecified: Secondary | ICD-10-CM | POA: Diagnosis not present

## 2021-05-30 DIAGNOSIS — Z7952 Long term (current) use of systemic steroids: Secondary | ICD-10-CM | POA: Diagnosis not present

## 2021-05-30 DIAGNOSIS — M546 Pain in thoracic spine: Secondary | ICD-10-CM | POA: Diagnosis present

## 2021-05-30 MED ORDER — LIDOCAINE 5 % EX PTCH: 1.0000 | MEDICATED_PATCH | CUTANEOUS | 0 refills | Status: DC

## 2021-05-30 MED ORDER — ACETAMINOPHEN 500 MG PO TABS
1000.0000 mg | ORAL_TABLET | Freq: Once | ORAL | Status: AC
  Administered 2021-05-30: 1000 mg via ORAL
  Filled 2021-05-30: qty 2

## 2021-05-30 MED ORDER — DIAZEPAM 2 MG PO TABS
2.0000 mg | ORAL_TABLET | Freq: Once | ORAL | Status: AC
  Administered 2021-05-30: 2 mg via ORAL
  Filled 2021-05-30: qty 1

## 2021-05-30 MED ORDER — LIDOCAINE 5 % EX PTCH
1.0000 | MEDICATED_PATCH | Freq: Once | CUTANEOUS | Status: DC
  Administered 2021-05-30: 1 via TRANSDERMAL
  Filled 2021-05-30: qty 1

## 2021-05-30 NOTE — ED Provider Notes (Signed)
[facility] DEPT Provider Note  CSN: [id number] Arrival date & time: 05/30/21 0448  Chief Complaint(s) Back Pain  HPI Jennifer Foley is a 65 y.o. female with a past medical history listed below here for upper back pain. Pain ongoing for several weeks. Was seen here last week with worse pain on the right. Reports that that pain has improved and now pain is worse on the left. Related to sleeping wrong and waking up with a crick in her neck. Pain is severe sharp and aching. Located in the parascapular musculature as well as the shoulder region. Worse with movement and palpation. No alleviating factors. Patient has been taking Vicodin and Robaxin with no relief. She has not placed any topical muscle creams. She denies any falls or trauma. No focal deficits.   Back Pain  Past Medical History Past Medical History:  Diagnosis Date   Anemia    Arthritis    per patient, in left and right knee and both hands   Atrial fibrillation (HCC)    Bilateral breast cysts    CHF (congestive heart failure) (HCC)    Chronic kidney disease    stage 3   Depression with anxiety    DM (diabetes mellitus) ([place])    Esophageal reflux    History of hiatal hernia    HTN (hypertension)    Hyperlipemia    Hyperthyroidism    Hypothyroidism    INR (international normal ratio) abnormal    MRSA (methicillin resistant staph aureus) culture positive    Panuveitis    Pneumonia    PTSD (post-traumatic stress disorder)    Sleep apnea    per patient has CPAP, wears it at night sometimes   Patient Active Problem List   Diagnosis Date Noted   Impingement syndrome of right shoulder 03/29/2021   Arthrofibrosis of knee joint, left 06/24/2020   S/P total knee arthroplasty, left 06/02/2020   Noncompliance with CPAP treatment 09/24/2018   A-fib ([place]) 08/31/2018   CHF (congestive heart failure) ([place]) 08/31/2018   Acute pyelonephritis 08/31/2018   Sepsis secondary to UTI ([place])  08/31/2018   Leucocytosis 08/31/2018   Hypothyroidism 08/31/2018   CKD (chronic kidney disease) stage 3, GFR 30-59 ml/min ([place]) 08/31/2018   HTN (hypertension) 08/31/2018   HLD (hyperlipidemia) 08/31/2018   Anxiety and depression 08/31/2018   Obesity 08/31/2018   Sepsis ([place]) 08/31/2018   Back pain 08/31/2018   Obstructive sleep apnea treated with continuous positive airway pressure (CPAP) 08/13/2018   Home Medication(s) Prior to Admission medications   Medication Sig Start Date End Date Taking? Authorizing Provider  lidocaine (LIDODERM) 5 % Place 1 patch onto the skin daily. Remove & Discard patch within 12 hours or as directed by MD 05/30/21  Yes Mariel Lukins, Grayce Sessions, MD  Adalimumab 40 MG/0.8ML PNKT Inject 40 mg into the skin every 14 (fourteen) days.  01/31/18   [provider]  azaTHIOprine (IMURAN) 50 MG tablet Take 50 mg by mouth 2 (two) times daily.  09/17/18   [provider]  budesonide-formoterol (SYMBICORT) 160-4.5 MCG/ACT inhaler Inhale 2 puffs into the lungs daily as needed (shortness of breath).     [provider]  buPROPion (WELLBUTRIN XL) 150 MG 24 hr tablet Take 150 mg by mouth daily. 06/18/18   [provider]  busPIRone (BUSPAR) 10 MG tablet Take 10 mg by mouth 2 (two) times daily.     [provider]  ELIQUIS 2.5 MG TABS tablet Take 2.5 mg by mouth 2 (two)  times daily.  06/07/20   [provider]  fluticasone (FLONASE) 50 MCG/ACT nasal spray Place 2 sprays into both nostrils daily as needed for allergies.     [provider]  furosemide (LASIX) 40 MG tablet Take 40 mg by mouth daily.    [provider]  HYDROcodone-acetaminophen (NORCO/VICODIN) 5-325 MG tablet Take 1 tablet by mouth every 6 (six) hours as needed for moderate pain. Patient not taking: No sig reported 08/25/20   [name], MD  HYDROcodone-acetaminophen (NORCO/VICODIN) 5-325 MG tablet Take 1 tablet by mouth every 6 (six) hours as  needed for moderate pain. Patient not taking: Reported on 03/29/2021 11/23/20   [name], MD  HYDROcodone-acetaminophen (NORCO/VICODIN) 5-325 MG tablet Take 1-2 tablets by mouth every 6 (six) hours as needed for moderate pain or severe pain. 05/23/21   Charlesetta Shanks, MD  ipratropium (ATROVENT) 0.03 % nasal spray Place 1 spray into the nose daily as needed (rhinitis).    [provider]  levalbuterol Penne Lash HFA) 45 MCG/ACT inhaler Inhale 2 puffs into the lungs every 4 (four) hours as needed for wheezing or shortness of breath.    [provider]  levothyroxine (SYNTHROID, LEVOTHROID) 50 MCG tablet Take 50 mcg by mouth daily before breakfast.    [provider]  methocarbamol (ROBAXIN) 500 MG tablet Take 1 tablet (500 mg total) by mouth every 6 (six) hours as needed for muscle spasms. Patient not taking: No sig reported 04/23/20   Lanae Crumbly, PA-C  methocarbamol (ROBAXIN) 500 MG tablet Take 1 tablet (500 mg total) by mouth every 6 (six) hours as needed for muscle spasms. 05/23/21   Charlesetta Shanks, MD  methylPREDNISolone (MEDROL DOSEPAK) 4 MG TBPK tablet Per Dosepak instructions. 05/23/21   Charlesetta Shanks, MD  metoprolol succinate (TOPROL-XL) 25 MG 24 hr tablet Take 25 mg by mouth daily.    [provider]  omeprazole (PRILOSEC) 20 MG capsule Take 20 mg by mouth daily.    [provider]  potassium chloride (K-DUR,KLOR-CON) 10 MEQ tablet Take 10 mEq by mouth daily. 01/02/18   [provider]  prednisoLONE acetate (PRED FORTE) 1 % ophthalmic suspension Place 1 drop into the right eye in the morning and at bedtime.     [provider]  rosuvastatin (CRESTOR) 5 MG tablet Take 5 mg by mouth daily. 05/07/20   [provider]                                                                                                                                    Past Surgical History Past Surgical History:  Procedure Laterality Date    ABDOMINAL HYSTERECTOMY     BREAST BIOPSY     CATARACT EXTRACTION  2018   CESAREAN SECTION     DENTAL SURGERY     EYE SURGERY     FOOT SURGERY     HERNIA REPAIR  KNEE ARTHROSCOPY Left 07/16/2020   Procedure: ARTHROSCOPY KNEE;  Surgeon: [name], MD;  Location: [facility];  Service: Orthopedics;  Laterality: Left;   KNEE CLOSED REDUCTION Left 07/16/2020   Procedure: LEFT KNEE MANIPULATION UNDER ANESTHESIA;  Surgeon: [name], MD;  Location: [facility];  Service: Orthopedics;  Laterality: Left;   KNEE SURGERY     TONSILLECTOMY AND ADENOIDECTOMY     TOTAL KNEE ARTHROPLASTY Left 04/21/2020   Procedure: LEFT TOTAL KNEE ARTHROPLASTY;  Surgeon: [name], MD;  Location: [facility];  Service: Orthopedics;  Laterality: Left;   Family History Family History  Problem Relation Age of Onset   Alcoholism Father    Arthritis Father    Hypertension Father        sister, mother   Diabetes Mellitus I Mother        sister   Renal Disease Mother    Stroke Mother    Epilepsy Brother     Social History Social History   Tobacco Use   Smoking status: Former    Packs/day: 0.25    Years: 34.00    Pack years: 8.50    Types: Cigarettes    Quit date: 12/15/2003    Years since quitting: 17.4   Smokeless tobacco: Former    Types: Snuff    Quit date: 03/2020   Tobacco comments:    04/15/2020: per patient 1-2 times a day, trying to stop taking it  Vaping Use   Vaping Use: Never used  Substance Use Topics   Alcohol use: Not Currently   Drug use: Never   Allergies Contrast media [iodinated diagnostic agents], Doxycycline, Albuterol, Albuterol sulfate, Amoxicillin, Codeine, and Erythromycin base  Review of Systems Review of Systems  Musculoskeletal:  Positive for back pain.  All other systems are reviewed and are negative for acute change except as noted in the HPI  Physical Exam Vital Signs  I have reviewed the triage vital signs BP (!) 165/102 (BP Location: Left Arm)   Pulse 62   Temp  98.3 F (36.8 C) (Oral)   Resp 20   Ht 5' (1.524 m)   Wt 86.2 kg   SpO2 100%   BMI 37.11 kg/m   Physical Exam Vitals reviewed.  Constitutional:      General: She is not in acute distress.    Appearance: She is well-developed. She is not diaphoretic.  HENT:     Head: Normocephalic and atraumatic.     Right Ear: External ear normal.     Left Ear: External ear normal.     Nose: Nose normal.  Eyes:     General: No scleral icterus.    Conjunctiva/sclera: Conjunctivae normal.  Neck:     Trachea: Phonation normal.  Cardiovascular:     Rate and Rhythm: Normal rate and regular rhythm.  Pulmonary:     Effort: Pulmonary effort is normal. No respiratory distress.     Breath sounds: No stridor. No rales.  Abdominal:     General: There is no distension.  Musculoskeletal:        General: Normal range of motion.     Cervical back: Normal range of motion. Spasms and tenderness present. No bony tenderness.     Thoracic back: Spasms and tenderness present. No bony tenderness.       Back:  Neurological:     Mental Status: She is alert and oriented to person, place, and time.  Psychiatric:        Behavior: Behavior normal.  ED Results and Treatments Labs (all labs ordered are listed, but only abnormal results are displayed) Labs Reviewed - No data to display                                                                                                                       EKG  EKG Interpretation  Date/Time:    Ventricular Rate:    PR Interval:    QRS Duration:   QT Interval:    QTC Calculation:   R Axis:     Text Interpretation:         Radiology No results found.  Pertinent labs & imaging results that were available during my care of the patient were reviewed by me and considered in my medical decision making (see MDM for details).  Medications Ordered in ED Medications  lidocaine (LIDODERM) 5 % 1 patch (1 patch Transdermal Patch Applied 05/30/21 0552)  diazepam  (VALIUM) tablet 2 mg (2 mg Oral Given 05/30/21 0552)  acetaminophen (TYLENOL) tablet 1,000 mg (1,000 mg Oral Given 05/30/21 [id number])                                                                                                                                     Procedures Procedures  (including critical care time)  Medical Decision Making / ED Course I have reviewed the nursing notes for this encounter and the patient's prior records (if available in EHR or on provided paperwork).  [name] was evaluated in Emergency Department on 05/30/2021 for the symptoms described in the history of present illness. She was evaluated in the context of the global COVID-19 pandemic, which necessitated consideration that the patient might be at risk for infection with the SARS-CoV-2 virus that causes COVID-19. Institutional protocols and algorithms that pertain to the evaluation of patients at risk for COVID-19 are in a state of rapid change based on information released by regulatory bodies including the CDC and federal and state organizations. These policies and algorithms were followed during the patient's care in the ED.     Consistent with muscle strain/spasm of the upper back parascapular musculature. Patient denied any trauma. No midline tenderness. No need for imaging at this time. Will provide patient with a low-dose of Valium, as well as Tylenol and Lidoderm patch. Patient is anticoagulated and cannot receive NSAIDs.  On reassessment, patient sleeping comfortably. Pain improved.  Final Clinical Impression(s) / ED  Diagnoses Final diagnoses:  Muscle spasm of back    The patient appears reasonably screened and/or stabilized for discharge and I doubt any other medical condition or other [facility] requiring further screening, evaluation, or treatment in the ED at this time prior to discharge. Safe for discharge with strict return precautions.  Disposition: Discharge  Condition: Good  I have  discussed the results, Dx and Tx plan with the patient/family who expressed understanding and agree(s) with the plan. Discharge instructions discussed at length. The patient/family was given strict return precautions who verbalized understanding of the instructions. No further questions at time of discharge.    ED Discharge Orders          Ordered    lidocaine (LIDODERM) 5 %  Every 24 hours        05/30/21 0650             Follow Up: [place] [address] [phone]  Go in 2 days as scheduled    This chart was dictated using voice recognition software.  Despite best efforts to proofread,  errors can occur which can change the documentation meaning.    Fatima Blank, MD 05/30/21 [phone]

## 2021-05-30 NOTE — Discharge Instructions (Addendum)
You may use over-the-counter Acetaminophen (Tylenol), topical muscle creams such as SalonPas, [employer], Bengay, etc. Please stretch, apply ice or heat (whichever helps), and have massage therapy for additional assistance. You can apply heat over the LidoDerm patch, but not the menthol patches.

## 2021-05-30 NOTE — ED Triage Notes (Signed)
Pt reports upper back pain for about a week and a half. Worse on the left. Denies injury. Think that it possibly resulted from sleeping wrong 2 weeks ago. States that the pain hurts the worst when laying down, but hurts while sitting and standing as well. No urinary changes. States she as seen at [facility] about a week ago for same, but the medications have not improved her pain. Reports some tingling in her R hand.

## 2021-06-01 ENCOUNTER — Other Ambulatory Visit: Payer: Self-pay

## 2021-06-01 ENCOUNTER — Encounter: Payer: Self-pay | Admitting: Orthopaedic Surgery

## 2021-06-01 ENCOUNTER — Ambulatory Visit: Payer: Self-pay

## 2021-06-01 ENCOUNTER — Ambulatory Visit (INDEPENDENT_AMBULATORY_CARE_PROVIDER_SITE_OTHER): Payer: Medicare Other | Admitting: Orthopaedic Surgery

## 2021-06-01 VITALS — BP 110/66 | HR 111 | Ht 60.0 in | Wt 192.0 lb

## 2021-06-01 DIAGNOSIS — M542 Cervicalgia: Secondary | ICD-10-CM
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
## 2021-06-01 NOTE — Addendum Note (Signed)
Addended by: Robyne Peers on: 06/01/2021 09:58 AM   Modules accepted: Orders

## 2021-06-08 ENCOUNTER — Other Ambulatory Visit: Payer: Self-pay

## 2021-06-08 ENCOUNTER — Telehealth: Payer: Self-pay | Admitting: Orthopaedic Surgery

## 2021-06-08 ENCOUNTER — Ambulatory Visit (INDEPENDENT_AMBULATORY_CARE_PROVIDER_SITE_OTHER): Payer: Medicare Other | Admitting: Physical Therapy

## 2021-06-08 ENCOUNTER — Other Ambulatory Visit: Payer: Self-pay | Admitting: Orthopaedic Surgery

## 2021-06-08 DIAGNOSIS — M546 Pain in thoracic spine: Secondary | ICD-10-CM | POA: Diagnosis not present

## 2021-06-08 DIAGNOSIS — M5412 Radiculopathy, cervical region: Secondary | ICD-10-CM | POA: Diagnosis not present

## 2021-06-08 NOTE — Therapy (Signed)
[facility] [address] [place], [place], [address] Phone: [phone]   Fax:  [phone]  Physical Therapy Evaluation  Patient Details  Name: Jennifer Foley MRN: [id number] Date of Birth: 1956/04/13 Referring Provider (PT): Rodell Perna   Encounter Date: 06/08/2021   PT End of Session - 06/08/21 1112     Visit Number 1    Number of Visits 8    Date for PT Re-Evaluation 07/06/21    Authorization Type UHC MCR    Progress Note Due on Visit 10    PT Start Time 1113    PT Stop Time 1146    PT Time Calculation (min) 33 min    Activity Tolerance Patient tolerated treatment well    Behavior During Therapy [facility] for tasks assessed/performed             Past Medical History:  Diagnosis Date   Anemia    Arthritis    per patient, in left and right knee and both hands   Atrial fibrillation (HCC)    Bilateral breast cysts    CHF (congestive heart failure) (HCC)    Chronic kidney disease    stage 3   Depression with anxiety    DM (diabetes mellitus) ([place])    Esophageal reflux    History of hiatal hernia    HTN (hypertension)    Hyperlipemia    Hyperthyroidism    Hypothyroidism    INR (international normal ratio) abnormal    MRSA (methicillin resistant staph aureus) culture positive    Panuveitis    Pneumonia    PTSD (post-traumatic stress disorder)    Sleep apnea    per patient has CPAP, wears it at night sometimes    Past Surgical History:  Procedure Laterality Date   ABDOMINAL HYSTERECTOMY     BREAST BIOPSY     CATARACT EXTRACTION  2018   CESAREAN SECTION     DENTAL SURGERY     EYE SURGERY     FOOT SURGERY     HERNIA REPAIR     KNEE ARTHROSCOPY Left 07/16/2020   Procedure: ARTHROSCOPY KNEE;  Surgeon: [name], MD;  Location: [place];  Service: Orthopedics;  Laterality: Left;   KNEE CLOSED REDUCTION Left 07/16/2020   Procedure: LEFT KNEE MANIPULATION UNDER ANESTHESIA;  Surgeon: [name], MD;  Location: [place];   Service: Orthopedics;  Laterality: Left;   KNEE SURGERY     TONSILLECTOMY AND ADENOIDECTOMY     TOTAL KNEE ARTHROPLASTY Left 04/21/2020   Procedure: LEFT TOTAL KNEE ARTHROPLASTY;  Surgeon: [name], MD;  Location: [place];  Service: Orthopedics;  Laterality: Left;    There were no vitals filed for this visit.    Subjective Assessment - 06/08/21 1115     Subjective Pt 15 min late. Woke up with neck pain about a week ago and could hardly turn my neck. Progressed to her upper back and went to ER. She had 2 injections which relieved her pain and numbness she was having in her arms. Returned to [employer]  because right side pain worsened. She is doing better with the meds and she has been using [employer]. Pain is more in her left upper back and numbness into her right arm and hand. She states her feet feel a little different too. Turning left causes left back pain.    Pertinent History see chart    Diagnostic tests xrays    Patient Stated Goals to get rid of pain  and numbness    Currently in Pain? Yes    Pain Score 7     Pain Location Back    Pain Orientation Upper;Left;Right    Pain Descriptors / Indicators Sharp    Pain Type Acute pain    Pain Radiating Towards bil UE Rt> Lt    Pain Onset 1 to 4 weeks ago    Pain Frequency Constant    Aggravating Factors  turning her head left    Pain Relieving Factors meds                [facility] PT Assessment - 06/08/21 0001       Assessment   Medical Diagnosis Neck pain M54.2; cspine and bil UE pain    Referring Provider (PT) Rodell Perna    Onset Date/Surgical Date 05/23/21    Hand Dominance Right    Next MD Visit 5 weeks    Prior Therapy yes for knee      Precautions   Precautions None      Restrictions   Weight Bearing Restrictions No      Balance Screen   Has the patient fallen in the past 6 months No    Has the patient had a decrease in activity level because of a fear of falling?  No    Is the patient reluctant to leave  their home because of a fear of falling?  No      Home Environment   Living Environment Private residence    Type of Home Apartment    Home Access Stairs to enter    [place] One level      Prior Function   Level of Independence Independent    Vocation On disability;Retired      Investment banker, corporate Control Postural limitations    Postural Limitations Rounded Shoulders;Forward head;Increased thoracic kyphosis      ROM / Strength   AROM / PROM / Strength AROM;Strength      AROM   AROM Assessment Site Cervical    Cervical Flexion full    Cervical Extension full   pain   Cervical - Right Side Bend 18    Cervical - Left Side Bend 32    Cervical - Right Rotation 61    Cervical - Left Rotation 53      Strength   Overall Strength Comments Not tested due to pt late      Palpation   Spinal mobility decreased T spine mobility mid thoracic at bra line with pain from T7/8 to T2/3 with PA mobs; cervical spine WNL (somewhat hypermobile)    Palpation comment marked pain in bil UT and right thoracic paraspinals      Special Tests    Special Tests Cervical    Other special tests ULTT: med + bil; ulnar and radial neg    Cervical Tests Spurling's;Dictraction      Spurling's   Findings Negative    Side Left    Comment RT      Distraction Test   Findngs Negative                        Objective measurements completed on examination: See above findings.               PT Education - 06/08/21 1354     Education Details HEP    Person(s) Educated Patient    Methods Explanation;Demonstration;Handout    Comprehension Verbalized understanding;Returned demonstration  PT Long Term Goals - 06/08/21 1404       PT LONG TERM GOAL #1   Title Patient will be independent in her HEP and progression.    Baseline -    Time 4    Period Weeks    Status New    Target Date 07/06/21      PT LONG TERM GOAL #2   Title  decrased upper back pain with ADLS by [id number]    Baseline -    Time 4    Period Weeks    Status New      PT LONG TERM GOAL #3   Title Pt to report no N/T or heaviness in bil UE    Baseline -    Time 4    Period Weeks    Status New      PT LONG TERM GOAL #4   Title -                    Plan - 06/08/21 1354     Clinical Impression Statement Patient presents with c/o of upper back and bil arm pain beginning 05/23/21 when she awoke with neck pain. The neck pain progressed to current symptoms and she now has constant pain in the left upper back and numbness and heaviness in the Rt UE. Meds have helped symptoms somewhat, but they persist. She has pain with neck ext, rotation and lateral flexion limiting AROM. She has full PROM and her cspine is somewhat hypermobile. Her Thoracic spine has decreased mobility around from T2/3 to T7/8. She has pain with bil median nerve tension tests, but it's unclear if it's a true postive test. Patient has postural deficits that are likely contributing to her symptoms and also reports sleeping on mulitple pillows. Modification to sleeping position was advised. UE strength was not assessed due to pt being late, but should be addressed next visit. Pain is affecting ADLs and she will benefit from PT to address these deficits.    Personal Factors and Comorbidities Past/Current Experience    Stability/Clinical Decision Making Evolving/Moderate complexity    Clinical Decision Making Low    Rehab Potential Good    PT Frequency 2x / week    PT Duration 4 weeks    PT Treatment/Interventions ADLs/Self Care Home Management;Cryotherapy;Electrical Stimulation;Moist Heat;Traction;Neuromuscular re-education;Therapeutic exercise;Therapeutic activities;Patient/family education;Manual techniques;Dry needling;Spinal Manipulations    PT Next Visit Plan assess UE and grip strength; work on postural strength, thoracic mobility    PT Home Exercise Plan [id number]    Consulted and  Agree with Plan of Care Patient             Patient will benefit from skilled therapeutic intervention in order to improve the following deficits and impairments:  Decreased range of motion, Pain, Impaired UE functional use, Increased muscle spasms, Postural dysfunction, Decreased strength, Impaired flexibility  Visit Diagnosis: Pain in thoracic spine  Radiculopathy, cervical region     Problem List Patient Active Problem List   Diagnosis Date Noted   Impingement syndrome of right shoulder 03/29/2021   Arthrofibrosis of knee joint, left 06/24/2020   S/P total knee arthroplasty, left 06/02/2020   Noncompliance with CPAP treatment 09/24/2018   A-fib ([place]) 08/31/2018   CHF (congestive heart failure) ([place]) 08/31/2018   Acute pyelonephritis 08/31/2018   Sepsis secondary to UTI ([place]) 08/31/2018   Leucocytosis 08/31/2018   Hypothyroidism 08/31/2018   CKD (chronic kidney disease) stage 3, GFR 30-59 ml/min ([place]) 08/31/2018   HTN (hypertension) 08/31/2018  HLD (hyperlipidemia) 08/31/2018   Anxiety and depression 08/31/2018   Obesity 08/31/2018   Sepsis ([place]) 08/31/2018   Back pain 08/31/2018   Obstructive sleep apnea treated with continuous positive airway pressure (CPAP) 08/13/2018    Madelyn Flavors PT 06/08/2021, 2:09 PM  [facility] [address] [place], [place], [address] Phone: [phone]   Fax:  [phone]  Name: Jennifer Foley MRN: [id number] Date of Birth: [birth year]

## 2021-06-08 NOTE — Telephone Encounter (Signed)
Pt request Rx for pain pills (Vicodin) and pain patch. Pt states the Salonpas patch are to expensive to purchase over the counter. Pt thinks her insurance will pay for Lidocaine patches if she get a Rx.       [facility] [facility] [place]

## 2021-06-08 NOTE — Telephone Encounter (Signed)
Please advise 

## 2021-06-08 NOTE — Patient Instructions (Signed)
Access Code: [email or web address] URL: [email or web address] Date: 06/08/2021 Prepared by: Almyra Free  Exercises Seated Cervical Retraction - 3 x daily - 7 x weekly - 1 sets - 5 reps - 3-5 sec hold

## 2021-06-09 ENCOUNTER — Other Ambulatory Visit: Payer: Self-pay | Admitting: Orthopaedic Surgery

## 2021-06-09 DIAGNOSIS — M545 Low back pain, unspecified: Secondary | ICD-10-CM

## 2021-06-09 MED ORDER — LIDOCAINE 5 % EX PTCH: 1.0000 | MEDICATED_PATCH | CUTANEOUS | Status: AC

## 2021-06-09 MED ORDER — LIDOCAINE 5 % EX PTCH: 1.0000 | MEDICATED_PATCH | CUTANEOUS | 0 refills | Status: DC

## 2021-06-09 NOTE — Telephone Encounter (Signed)
Sent to pharmacy 

## 2021-06-14 NOTE — Telephone Encounter (Signed)
Pt called in stating that the lidocaine patch needs prior auth in order for her to get them. Please advise

## 2021-06-14 NOTE — Telephone Encounter (Signed)
Submitted prior auth to Cover My Meds.

## 2021-06-15 ENCOUNTER — Telehealth: Payer: Self-pay | Admitting: Orthopaedic Surgery

## 2021-06-15 NOTE — Telephone Encounter (Signed)
I called patient and advised I am still awaiting response from Cover My Meds.

## 2021-06-15 NOTE — Telephone Encounter (Signed)
Received call from Achille with [employer] she advised the diag code M54.12nm Rx. Was not authorized for the LiDocaine patches. Brandy asked for a call back with Code the FDA will approved.. The number to contact Theadora Rama is   [phone]

## 2021-06-17 NOTE — Telephone Encounter (Signed)
I left voicemail for patient advising insurance has denied rx. I am awaiting fax from insurance company in regards to denial. I did advise Lidocaine 4% patches are available now over the counter. Will advise once I receive insurance information.

## 2021-06-17 NOTE — Telephone Encounter (Signed)
I called, spoke with Jennifer Foley. Medication has been denied. Paperwork was faxed to [phone].  I do not have a copy of the paperwork, so she will refax.  Awaiting denial paperwork to see if there is anything that we can resubmit for appeal.

## 2021-06-17 NOTE — Telephone Encounter (Signed)
I left voicemail for patient advising insurance has denied rx. I am awaiting fax from insurance company in regards to denial. I did advise Lidocaine 4% patches are available now over the counter.

## 2021-06-21 ENCOUNTER — Encounter: Payer: Medicare Other | Admitting: Physical Therapy

## 2021-06-23 ENCOUNTER — Encounter: Payer: Medicare Other | Admitting: Rehabilitative and Restorative Service Providers"

## 2021-06-24 NOTE — Telephone Encounter (Signed)
Insurance has denied lidocaine patches for patient's medical diagnosis. Multiple diagnosis tried and failed. I called patient and advised. She will try and get the over the counter patches.

## 2021-06-28 ENCOUNTER — Encounter: Payer: Medicare Other | Admitting: Rehabilitative and Restorative Service Providers"

## 2021-06-30 ENCOUNTER — Ambulatory Visit (INDEPENDENT_AMBULATORY_CARE_PROVIDER_SITE_OTHER): Payer: Medicare Other | Admitting: Rehabilitative and Restorative Service Providers"

## 2021-06-30 ENCOUNTER — Other Ambulatory Visit: Payer: Self-pay

## 2021-06-30 ENCOUNTER — Encounter: Payer: Self-pay | Admitting: Rehabilitative and Restorative Service Providers"

## 2021-06-30 DIAGNOSIS — M546 Pain in thoracic spine: Secondary | ICD-10-CM

## 2021-06-30 DIAGNOSIS — M5412 Radiculopathy, cervical region: Secondary | ICD-10-CM

## 2021-06-30 NOTE — Therapy (Signed)
[facility] [address] [place], [place], [address] Phone: [phone]   Fax:  [phone]  Physical Therapy Treatment  Patient Details  Name: Jennifer Foley MRN: [id number] Date of Birth: [birth year] Referring Provider (PT): Rodell Perna   Encounter Date: 06/30/2021   PT End of Session - 06/30/21 1713     Visit Number 2    Number of Visits 8    Date for PT Re-Evaluation 07/06/21    Authorization Type UHC MCR    Progress Note Due on Visit 10    PT Start Time [id number]    PT Stop Time 1428    PT Time Calculation (min) 40 min    Activity Tolerance Patient tolerated treatment well;No increased pain    Behavior During Therapy WFL for tasks assessed/performed             Past Medical History:  Diagnosis Date   Anemia    Arthritis    per patient, in left and right knee and both hands   Atrial fibrillation (HCC)    Bilateral breast cysts    CHF (congestive heart failure) (HCC)    Chronic kidney disease    stage 3   Depression with anxiety    DM (diabetes mellitus) (HCC)    Esophageal reflux    History of hiatal hernia    HTN (hypertension)    Hyperlipemia    Hyperthyroidism    Hypothyroidism    INR (international normal ratio) abnormal    MRSA (methicillin resistant staph aureus) culture positive    Panuveitis    Pneumonia    PTSD (post-traumatic stress disorder)    Sleep apnea    per patient has CPAP, wears it at night sometimes    Past Surgical History:  Procedure Laterality Date   ABDOMINAL HYSTERECTOMY     BREAST BIOPSY     CATARACT EXTRACTION  2018   CESAREAN SECTION     DENTAL SURGERY     EYE SURGERY     FOOT SURGERY     HERNIA REPAIR     KNEE ARTHROSCOPY Left 07/16/2020   Procedure: ARTHROSCOPY KNEE;  Surgeon: [name], MD;  Location: [place];  Service: Orthopedics;  Laterality: Left;   KNEE CLOSED REDUCTION Left 07/16/2020   Procedure: LEFT KNEE MANIPULATION UNDER ANESTHESIA;  Surgeon: [name], MD;   Location: [place];  Service: Orthopedics;  Laterality: Left;   KNEE SURGERY     TONSILLECTOMY AND ADENOIDECTOMY     TOTAL KNEE ARTHROPLASTY Left 04/21/2020   Procedure: LEFT TOTAL KNEE ARTHROPLASTY;  Surgeon: [name], MD;  Location: [place];  Service: Orthopedics;  Laterality: Left;    There were no vitals filed for this visit.   Subjective Assessment - 06/30/21 1710     Subjective Naw report poor compliance with her early HEP.    Pertinent History see chart    Diagnostic tests xrays    Patient Stated Goals to get rid of pain and numbness    Currently in Pain? Yes    Pain Score 7     Pain Location Back    Pain Orientation Mid;Upper    Pain Descriptors / Indicators Sharp    Pain Type Chronic pain    Pain Radiating Towards R > L UE    Pain Onset More than a month ago    Pain Frequency Constant    Aggravating Factors  Prolonged postures and flexion    Pain Relieving Factors Pain medication  Effect of Pain on Daily Activities Affects all function including sleep    Multiple Pain Sites No                [facility] PT Assessment - 06/30/21 0001       Posture/Postural Control   Posture/Postural Control Postural limitations    Postural Limitations Forward head;Rounded Shoulders;Decreased lumbar lordosis      ROM / Strength   AROM / PROM / Strength Strength      Strength   Overall Strength Deficits    Strength Assessment Site Cervical    Cervical Extension --   5.2 pounds                          [facility] Adult PT Treatment/Exercise - 06/30/21 0001       Therapeutic Activites    Therapeutic Activities Other Therapeutic Activities    Other Therapeutic Activities Reviewed exam findings, postural basics, body mechanics related to school and new HEP      Exercises   Exercises Neck      Neck Exercises: Theraband   Scapula Retraction 10 reps;Green;Limitations    Scapula Retraction Limitations 3 seconds pull to chest and 10X 5 seconds shoulder blade  pinches    Shoulder Extension 10 reps;Red;Limitations    Shoulder Extension Limitations Palms up and squeeze scapulae together    Shoulder External Rotation 10 reps;Red;Limitations    Shoulder External Rotation Limitations Good posture and rolled up towel under elbow      Neck Exercises: Standing   Other Standing Exercises Cervical Isometrics Extension 10X 5 seconds    Other Standing Exercises Trunk extension AROM (hips forward) 10X 3 seconds                     PT Education - 06/30/21 1712     Education Details Reviewed exam findings, assessed cervical strength, added postural, cervical and scapular strengthening activities, spent time going over practical posture and body mechanics.    Person(s) Educated Patient    Methods Explanation;Tactile cues;Demonstration;Verbal cues;Handout    Comprehension Verbal cues required;Need further instruction;Returned demonstration;Verbalized understanding;Tactile cues required                 PT Long Term Goals - 06/08/21 1404       PT LONG TERM GOAL #1   Title Patient will be independent in her HEP and progression.    Baseline -    Time 4    Period Weeks    Status New    Target Date 07/06/21      PT LONG TERM GOAL #2   Title decrased upper back pain with ADLS by [id number]    Baseline -    Time 4    Period Weeks    Status New      PT LONG TERM GOAL #3   Title Pt to report no N/T or heaviness in bil UE    Baseline -    Time 4    Period Weeks    Status New      PT LONG TERM GOAL #4   Title -                   Plan - 06/30/21 1714     Clinical Impression Statement Jennifer Foley has upper and mid-back pain with B UE radiculopathy.  Posture and body mechanics appear to be a major contributor.  Jennifer Foley noted progress with activities today and will  need additional education, practical and strength work to meet LTGs.  Consistent HEP compliance will also be needed.    Personal Factors and Comorbidities Past/Current  Experience    Stability/Clinical Decision Making Evolving/Moderate complexity    Rehab Potential Good    PT Frequency 2x / week    PT Duration 4 weeks    PT Treatment/Interventions ADLs/Self Care Home Management;Cryotherapy;Electrical Stimulation;Moist Heat;Traction;Neuromuscular re-education;Therapeutic exercise;Therapeutic activities;Patient/family education;Manual techniques;Dry needling;Spinal Manipulations    PT Next Visit Plan Progress strengthening and practical body mechanics    PT Home Exercise Plan [email or web address]    Consulted and Agree with Plan of Care Patient             Patient will benefit from skilled therapeutic intervention in order to improve the following deficits and impairments:  Decreased range of motion, Pain, Impaired UE functional use, Increased muscle spasms, Postural dysfunction, Decreased strength, Impaired flexibility  Visit Diagnosis: Pain in thoracic spine  Radiculopathy, cervical region     Problem List Patient Active Problem List   Diagnosis Date Noted   Impingement syndrome of right shoulder 03/29/2021   Arthrofibrosis of knee joint, left 06/24/2020   S/P total knee arthroplasty, left 06/02/2020   Noncompliance with CPAP treatment 09/24/2018   A-fib ([place]) 08/31/2018   CHF (congestive heart failure) ([place]) 08/31/2018   Acute pyelonephritis 08/31/2018   Sepsis secondary to UTI ([place]) 08/31/2018   Leucocytosis 08/31/2018   Hypothyroidism 08/31/2018   CKD (chronic kidney disease) stage 3, GFR 30-59 ml/min ([place]) 08/31/2018   HTN (hypertension) 08/31/2018   HLD (hyperlipidemia) 08/31/2018   Anxiety and depression 08/31/2018   Obesity 08/31/2018   Sepsis ([place]) 08/31/2018   Back pain 08/31/2018   Obstructive sleep apnea treated with continuous positive airway pressure (CPAP) 08/13/2018    Farley Ly, PT, MPT 06/30/2021, 5:16 PM  [facility] [address] [place], [place], [address] Phone: [phone]   Fax:   [phone]  Name: Jennifer Foley MRN: [id number] Date of Birth: 08-26-1956

## 2021-06-30 NOTE — Patient Instructions (Signed)
Access Code: [email or web address] URL: [email or web address] Date: 06/30/2021 Prepared by: Vista Mink  Exercises Standing Scapular Retraction - 5 x daily - 7 x weekly - 1 sets - 5 reps - 5 second hold Standing Isometric Cervical Extension with Manual Resistance - 5 x daily - 7 x weekly - 1 sets - 5 reps - 5 hold Standing Lumbar Extension at [place] 5 x daily - 7 x weekly - 1 sets - 5 reps - 3 seconds hold

## 2021-07-05 ENCOUNTER — Encounter: Payer: Self-pay | Admitting: Physical Therapy

## 2021-07-05 ENCOUNTER — Ambulatory Visit (INDEPENDENT_AMBULATORY_CARE_PROVIDER_SITE_OTHER): Payer: Medicare Other | Admitting: Physical Therapy

## 2021-07-05 ENCOUNTER — Other Ambulatory Visit: Payer: Self-pay

## 2021-07-05 DIAGNOSIS — M5412 Radiculopathy, cervical region: Secondary | ICD-10-CM | POA: Diagnosis not present

## 2021-07-05 DIAGNOSIS — M546 Pain in thoracic spine: Secondary | ICD-10-CM

## 2021-07-05 NOTE — Therapy (Addendum)
[facility] Physical Therapy [address] [place], [place], [address] Phone: [phone]   Fax:  [phone]  Physical Therapy Treatment Discharge  Patient Details  Name: Jennifer Foley MRN: [id number] Date of Birth: 1956-09-05 Referring Provider (PT): Rodell Perna   Encounter Date: 07/05/2021   PT End of Session - 07/05/21 1418     Visit Number 3    Number of Visits 8    Date for PT Re-Evaluation 07/06/21    Authorization Type UHC MCR    Progress Note Due on Visit 10    PT Start Time 2707    PT Stop Time 1427    PT Time Calculation (min) 24 min    Activity Tolerance Patient tolerated treatment well;No increased pain    Behavior During Therapy WFL for tasks assessed/performed             Past Medical History:  Diagnosis Date   Anemia    Arthritis    per patient, in left and right knee and both hands   Atrial fibrillation (HCC)    Bilateral breast cysts    CHF (congestive heart failure) (HCC)    Chronic kidney disease    stage 3   Depression with anxiety    DM (diabetes mellitus) (HCC)    Esophageal reflux    History of hiatal hernia    HTN (hypertension)    Hyperlipemia    Hyperthyroidism    Hypothyroidism    INR (international normal ratio) abnormal    MRSA (methicillin resistant staph aureus) culture positive    Panuveitis    Pneumonia    PTSD (post-traumatic stress disorder)    Sleep apnea    per patient has CPAP, wears it at night sometimes    Past Surgical History:  Procedure Laterality Date   ABDOMINAL HYSTERECTOMY     BREAST BIOPSY     CATARACT EXTRACTION  2018   CESAREAN SECTION     DENTAL SURGERY     EYE SURGERY     FOOT SURGERY     HERNIA REPAIR     KNEE ARTHROSCOPY Left 07/16/2020   Procedure: ARTHROSCOPY KNEE;  Surgeon: [name], MD;  Location: [place];  Service: Orthopedics;  Laterality: Left;   KNEE CLOSED REDUCTION Left 07/16/2020   Procedure: LEFT KNEE MANIPULATION UNDER ANESTHESIA;  Surgeon: [name],  MD;  Location: [place];  Service: Orthopedics;  Laterality: Left;   KNEE SURGERY     TONSILLECTOMY AND ADENOIDECTOMY     TOTAL KNEE ARTHROPLASTY Left 04/21/2020   Procedure: LEFT TOTAL KNEE ARTHROPLASTY;  Surgeon: [name], MD;  Location: [place];  Service: Orthopedics;  Laterality: Left;    There were no vitals filed for this visit.   Subjective Assessment - 07/05/21 1413     Subjective Pt reporting 3/10 pain in her neck today.    Diagnostic tests xrays    Patient Stated Goals to get rid of pain and numbness    Currently in Pain? Yes    Pain Score 3     Pain Location Neck    Pain Orientation Mid;Lower    Pain Descriptors / Indicators Sore    Pain Type Chronic pain    Pain Onset More than a month ago                               [facility] Adult PT Treatment/Exercise - 07/05/21 0001  Exercises   Exercises Neck      Neck Exercises: Machines for Strengthening   UBE (Upper Arm Bike) L1 x 4 minutes (2 minutes each direction)      Neck Exercises: Theraband   Rows 20 reps;Green    Shoulder External Rotation Limitations bilateral ER "money" position x 15      Neck Exercises: Seated   Other Seated Exercise shoulder flexion using 1# bar 2x10      Neck Exercises: Stretches   Upper Trapezius Stretch 3 reps;10 seconds    Upper Trapezius Stretch Limitations each side    Levator Stretch 2 reps;Right;Left;10 seconds    Other Neck Stretches Door stretch shoulder at 90 degrees    Other Neck Stretches cervical rotation stretch with towel x 2 each side holding 10 seconds                          PT Long Term Goals - 07/05/21 1428       PT LONG TERM GOAL #1   Title Patient will be independent in her HEP and progression.    Status On-going      PT LONG TERM GOAL #2   Title decrased upper back pain with ADLS by 62%    Status On-going      PT LONG TERM GOAL #3   Title Pt to report no N/T or heaviness in bil UE    Status On-going      PT  LONG TERM GOAL #4   Status On-going                   Plan - 07/05/21 1422     Clinical Impression Statement Pt arriving today 17 miutes late to therapy clinic. Pt aggreeing to go ahead with shortened session. Treatment focusing on postural exercises and cervical ROM. Continue skilled PT as pt tolerates to maximize function.    Personal Factors and Comorbidities Past/Current Experience    Stability/Clinical Decision Making Evolving/Moderate complexity    Rehab Potential Good    PT Frequency 2x / week    PT Duration 4 weeks    PT Treatment/Interventions ADLs/Self Care Home Management;Cryotherapy;Electrical Stimulation;Moist Heat;Traction;Neuromuscular re-education;Therapeutic exercise;Therapeutic activities;Patient/family education;Manual techniques;Dry needling;Spinal Manipulations    PT Next Visit Plan Continue Progress strengthening and practical body mechanics and cervical ROM    PT Home Exercise Plan [email or web address]    Consulted and Agree with Plan of Care Patient             Patient will benefit from skilled therapeutic intervention in order to improve the following deficits and impairments:  Decreased range of motion, Pain, Impaired UE functional use, Increased muscle spasms, Postural dysfunction, Decreased strength, Impaired flexibility  Visit Diagnosis: Pain in thoracic spine  Radiculopathy, cervical region     Problem List Patient Active Problem List   Diagnosis Date Noted   Impingement syndrome of right shoulder 03/29/2021   Arthrofibrosis of knee joint, left 06/24/2020   S/P total knee arthroplasty, left 06/02/2020   Noncompliance with CPAP treatment 09/24/2018   A-fib ([place]) 08/31/2018   CHF (congestive heart failure) ([place]) 08/31/2018   Acute pyelonephritis 08/31/2018   Sepsis secondary to UTI ([place]) 08/31/2018   Leucocytosis 08/31/2018   Hypothyroidism 08/31/2018   CKD (chronic kidney disease) stage 3, GFR 30-59 ml/min ([place]) 08/31/2018   HTN (hypertension)  08/31/2018   HLD (hyperlipidemia) 08/31/2018   Anxiety and depression 08/31/2018   Obesity 08/31/2018   Sepsis ([place]) 08/31/2018  Back pain 08/31/2018   Obstructive sleep apnea treated with continuous positive airway pressure (CPAP) 08/13/2018    Oretha Caprice, PT,  MPT 07/05/2021, 2:32 PM  [facility] Physical Therapy [address] [place], [place], [address] Phone: [phone]   Fax:  [phone]  Name: Jennifer Foley MRN: [id number] Date of Birth: 25-Jun-1956  PHYSICAL THERAPY DISCHARGE SUMMARY  Visits from Start of Care: 3  Current functional level related to goals / functional outcomes: See above   Remaining deficits: See above   Education / Equipment: HEP   Patient agrees to discharge. Patient goals were not met. Patient is being discharged due to not returning since the last visit.

## 2021-07-06 ENCOUNTER — Ambulatory Visit: Payer: Medicare Other | Admitting: Orthopaedic Surgery

## 2021-07-07 ENCOUNTER — Encounter: Payer: Medicare Other | Admitting: Physical Therapy

## 2021-07-12 ENCOUNTER — Encounter: Payer: Medicare Other | Admitting: Physical Therapy

## 2021-07-12 ENCOUNTER — Telehealth: Payer: Self-pay | Admitting: Physical Therapy

## 2021-07-12 NOTE — Telephone Encounter (Signed)
Pt did not show up for her 1:45 pm Physical Therapy appointment today. Follow up was made to check on pt and a reminder was left about her upcoming appointment on Thursday 07/14/2021 at 1:45pm.   Kearney Hard, PT, MPT 07/12/21 2:19 PM

## 2021-07-14 ENCOUNTER — Telehealth: Payer: Self-pay | Admitting: Rehabilitative and Restorative Service Providers"

## 2021-07-14 ENCOUNTER — Encounter: Payer: Medicare Other | Admitting: Rehabilitative and Restorative Service Providers"

## 2021-07-14 NOTE — Telephone Encounter (Signed)
Call was made to check on Jennifer Foley after no show.  No appointments are left.

## 2021-07-29 ENCOUNTER — Ambulatory Visit: Payer: Medicare Other | Admitting: Orthopaedic Surgery

## 2022-01-03 ENCOUNTER — Other Ambulatory Visit: Payer: Self-pay

## 2022-01-03 ENCOUNTER — Ambulatory Visit: Payer: Medicare Other | Attending: Family Medicine | Admitting: Physical Therapy

## 2022-01-03 ENCOUNTER — Encounter: Payer: Self-pay | Admitting: Physical Therapy

## 2022-01-03 DIAGNOSIS — M5412 Radiculopathy, cervical region: Secondary | ICD-10-CM | POA: Diagnosis present

## 2022-01-03 DIAGNOSIS — M546 Pain in thoracic spine: Secondary | ICD-10-CM | POA: Diagnosis not present

## 2022-01-03 DIAGNOSIS — M6281 Muscle weakness (generalized): Secondary | ICD-10-CM | POA: Diagnosis present

## 2022-01-03 DIAGNOSIS — M545 Low back pain, unspecified: Secondary | ICD-10-CM | POA: Diagnosis present

## 2022-01-03 DIAGNOSIS — R293 Abnormal posture: Secondary | ICD-10-CM
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
## 2022-01-05 ENCOUNTER — Ambulatory Visit: Payer: Medicare Other | Admitting: Physical Therapy

## 2022-01-06 ENCOUNTER — Other Ambulatory Visit: Payer: Self-pay

## 2022-01-06 ENCOUNTER — Ambulatory Visit (INDEPENDENT_AMBULATORY_CARE_PROVIDER_SITE_OTHER): Payer: Medicare Other | Admitting: Orthopaedic Surgery

## 2022-01-06 ENCOUNTER — Encounter: Payer: Self-pay | Admitting: Orthopaedic Surgery

## 2022-01-06 VITALS — BP 112/77 | HR 73 | Ht 61.0 in | Wt 197.0 lb

## 2022-01-06 DIAGNOSIS — M7541 Impingement syndrome of right shoulder: Secondary | ICD-10-CM | POA: Diagnosis not present

## 2022-01-06 DIAGNOSIS — M24662 Ankylosis, left knee: Secondary | ICD-10-CM

## 2022-01-06 MED ORDER — BUPIVACAINE HCL 0.25 % IJ SOLN
4.0000 mL | INTRAMUSCULAR | Status: AC | PRN
  Administered 2022-01-06: 4 mL via INTRA_ARTICULAR

## 2022-01-06 MED ORDER — METHYLPREDNISOLONE ACETATE 40 MG/ML IJ SUSP
40.0000 mg | INTRAMUSCULAR | Status: AC | PRN
  Administered 2022-01-06: 40 mg via INTRA_ARTICULAR

## 2022-01-06 MED ORDER — LIDOCAINE HCL 1 % IJ SOLN
0.5000 mL | INTRAMUSCULAR | Status: AC | PRN
  Administered 2022-01-06: .5 mL
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
## 2022-01-18 ENCOUNTER — Encounter: Payer: Self-pay | Admitting: Physical Therapy

## 2022-01-18 ENCOUNTER — Ambulatory Visit: Payer: Medicare Other | Admitting: Physical Therapy

## 2022-01-18 DIAGNOSIS — M546 Pain in thoracic spine: Secondary | ICD-10-CM

## 2022-01-18 DIAGNOSIS — R293 Abnormal posture: Secondary | ICD-10-CM

## 2022-01-18 DIAGNOSIS — M5412 Radiculopathy, cervical region: Secondary | ICD-10-CM

## 2022-01-18 DIAGNOSIS — M6281 Muscle weakness (generalized): Secondary | ICD-10-CM

## 2022-01-18 DIAGNOSIS — M545 Low back pain, unspecified: Secondary | ICD-10-CM

## 2022-01-18 NOTE — Therapy (Signed)
[place] ?[address] ?[place], [place], [address] ?Phone: [phone]   Fax:  [phone] ? ?Physical Therapy Treatment ? ?Patient Details  ?Name: Jennifer Foley ?MRN: [id number] ?Date of Birth: 13-Apr-1956 ?Referring Provider (PT): Tammy Arn Medal ? ? ?Encounter Date: 01/18/2022 ? ? PT End of Session - 01/18/22 1644   ? ? Visit Number 2   ? Date for PT Re-Evaluation 01/31/22   ? Authorization Time Period 01/03/22 to 01/31/22   ? PT Start Time 2197   ? PT Stop Time 5883   ? PT Time Calculation (min) 40 min   ? Activity Tolerance Patient tolerated treatment well   ? Behavior During Therapy [facility] for tasks assessed/performed   ? ?  ?  ? ?  ? ? ?Past Medical History:  ?Diagnosis Date  ? Anemia   ? Arthritis   ? per patient, in left and right knee and both hands  ? Atrial fibrillation ([place])   ? Bilateral breast cysts   ? CHF (congestive heart failure) ([place])   ? Chronic kidney disease   ? stage 3  ? Depression with anxiety   ? DM (diabetes mellitus) ([place])   ? Esophageal reflux   ? History of hiatal hernia   ? HTN (hypertension)   ? Hyperlipemia   ? Hyperthyroidism   ? Hypothyroidism   ? INR (international normal ratio) abnormal   ? MRSA (methicillin resistant staph aureus) culture positive   ? Panuveitis   ? Pneumonia   ? PTSD (post-traumatic stress disorder)   ? Sleep apnea   ? per patient has CPAP, wears it at night sometimes  ? ? ?Past Surgical History:  ?Procedure Laterality Date  ? ABDOMINAL HYSTERECTOMY    ? BREAST BIOPSY    ? CATARACT EXTRACTION  2018  ? CESAREAN SECTION    ? DENTAL SURGERY    ? EYE SURGERY    ? FOOT SURGERY    ? HERNIA REPAIR    ? KNEE ARTHROSCOPY Left 07/16/2020  ? Procedure: ARTHROSCOPY KNEE;  Surgeon: [name], MD;  Location: [facility];  Service: Orthopedics;  Laterality: Left;  ? KNEE CLOSED REDUCTION Left 07/16/2020  ? Procedure: LEFT KNEE MANIPULATION UNDER ANESTHESIA;  Surgeon: [name], MD;  Location: [facility];  Service:  Orthopedics;  Laterality: Left;  ? KNEE SURGERY    ? TONSILLECTOMY AND ADENOIDECTOMY    ? TOTAL KNEE ARTHROPLASTY Left 04/21/2020  ? Procedure: LEFT TOTAL KNEE ARTHROPLASTY;  Surgeon: [name], MD;  Location: [facility];  Service: Orthopedics;  Laterality: Left;  ? ? ?There were no vitals filed for this visit. ? ? Subjective Assessment - 01/18/22 1608   ? ? Subjective "I am doing some better, because [name] been doing my exercises."   ? Currently in Pain? Yes   ? Pain Score 7    ? ?  ?  ? ?  ? ? ? ? ? ? ? ? ? ? ? ? ? ? ? ? ? ? ? ? [place] Adult PT Treatment/Exercise - 01/18/22 0001   ? ?  ? Exercises  ? Exercises Lumbar   ?  ? Neck Exercises: Machines for Strengthening  ? Nustep L4 x 6 min   ?  ? Lumbar Exercises: Seated  ? Long [employer] on Chair Strengthening;Both;2 sets;10 reps   ? LAQ on Chair Weights (lbs) 2   ? Sit to Stand 5 reps   x3  ?  ? Shoulder  Exercises: Seated  ? Row Strengthening;Both;Theraband;20 reps   ? Theraband Level (Shoulder Row) Level 2 (Red)   ?  ? Shoulder Exercises: Standing  ? Extension Theraband;20 reps;Both;Strengthening   ? Theraband Level (Shoulder Extension) Level 2 (Red)   ? ?  ?  ? ?  ? ? ? ? ? ? ? ? ? ? ? ? PT Short Term Goals - 01/03/22 1630   ? ?  ? PT SHORT TERM GOAL #1  ? Title Will be compliant with appropriate progressive HEP   ? Time 2   ? Period Weeks   ? Status New   ? Target Date 01/17/22   ?  ? PT SHORT TERM GOAL #2  ? Title Pain to be no more than 5/10 at worst   ? ?  ?  ? ?  ? ? ? ? PT Long Term Goals - 01/03/22 1633   ? ?  ? PT LONG TERM GOAL #1  ? Title MMT to have improved by 1 grade in all weak groups   ? Time 4   ? Period Weeks   ? Status New   ? Target Date 01/31/22   ?  ? PT LONG TERM GOAL #2  ? Title Cervical, thoracic, and lumbar mobility to have improved by 50%   ? Time 4   ? Period Weeks   ? Status New   ?  ? PT LONG TERM GOAL #3  ? Title Will be able to access community to do grocery shopping and go to class without significnat incresae in pain   ? Time 4   ?  Period Weeks   ? Status New   ?  ? PT LONG TERM GOAL #4  ? Title FOTO score to have improved by at least 10 points to show improvement in condition   ? Time 4   ? Period Weeks   ? Status New   ? ?  ?  ? ?  ? ? ? ? ? ? ? ? Plan - 01/18/22 1644   ? ? Clinical Impression Statement Pt tolerated an initial progression to TW well evident by no subjective reports of increase pain. Pt has increase fatigue with sit to stands. She did reports some R shoulder pain with rows but also stated she has a RC tear. Postural cue required with standing shoulder extensions. No issues with seated leg exercise   ? Personal Factors and Comorbidities Behavior Pattern;Past/Current Experience;Fitness;Comorbidity 3+;Social Background;Time since onset of injury/illness/exacerbation   ? Examination-Activity Limitations Locomotion Level;Transfers;Bed Mobility;Sit;Carry;Squat;Stairs;Stand;Lift   ? Examination-Participation Restrictions Church;Cleaning;Occupation;Community Activity;Shop;Yard Work   ? Rehab Potential Fair   ? PT Frequency 2x / week   ? PT Duration 4 weeks   ? PT Treatment/Interventions ADLs/Self Care Home Management;Cryotherapy;Electrical Stimulation;Iontophoresis '4mg'$ /ml Dexamethasone;Moist Heat;Traction;Ultrasound;DME Instruction;Gait training;Stair training;Functional mobility training;Therapeutic activities;Therapeutic exercise;Balance training;Neuromuscular re-education;Patient/family education;Manual techniques;Passive range of motion;Dry needling;Energy conservation;Taping;Spinal Manipulations   ? PT Next Visit Plan general postural training, trunk mobility and postural strength, biomechanics   ? ?  ?  ? ?  ? ? ?Patient will benefit from skilled therapeutic intervention in order to improve the following deficits and impairments:  Abnormal gait, Decreased range of motion, Difficulty walking, Increased fascial restricitons, Increased muscle spasms, Impaired UE functional use, Obesity, Decreased activity tolerance, Impaired  perceived functional ability, Pain, Hypomobility, Impaired flexibility, Improper body mechanics, Decreased mobility, Postural dysfunction, Decreased strength ? ?Visit Diagnosis: ?Pain in thoracic spine ? ?Radiculopathy, cervical region ? ?Acute left-sided low back pain, unspecified whether sciatica  present ? ?Abnormal posture ? ?Muscle weakness (generalized) ? ? ? ? ?Problem List ?Patient Active Problem List  ? Diagnosis Date Noted  ? Impingement syndrome of right shoulder 03/29/2021  ? Arthrofibrosis of knee joint, left 06/24/2020  ? S/P total knee arthroplasty, left 06/02/2020  ? Noncompliance with CPAP treatment 09/24/2018  ? A-fib ([place]) 08/31/2018  ? CHF (congestive heart failure) ([place]) 08/31/2018  ? Acute pyelonephritis 08/31/2018  ? Sepsis secondary to UTI ([place]) 08/31/2018  ? Leucocytosis 08/31/2018  ? Hypothyroidism 08/31/2018  ? CKD (chronic kidney disease) stage 3, GFR 30-59 ml/min (HCC) 08/31/2018  ? HTN (hypertension) 08/31/2018  ? HLD (hyperlipidemia) 08/31/2018  ? Anxiety and depression 08/31/2018  ? Obesity 08/31/2018  ? Sepsis ([place]) 08/31/2018  ? Back pain 08/31/2018  ? Obstructive sleep apnea treated with continuous positive airway pressure (CPAP) 08/13/2018  ? ? ?Scot Jun, PTA ?01/18/2022, 4:47 PM ? ?[facility] [place] ?[address] ?[place], [place], [address] ?Phone: [phone]   Fax:  [phone] ? ?Name: Miana Politte ?MRN: [id number] ?Date of Birth: May 12, 1956 ? ? ? ?

## 2022-01-23 ENCOUNTER — Ambulatory Visit: Payer: Medicare Other | Admitting: Physical Therapy

## 2022-01-30 ENCOUNTER — Other Ambulatory Visit: Payer: Self-pay | Admitting: Radiology

## 2022-02-06 ENCOUNTER — Ambulatory Visit: Payer: Medicare Other | Admitting: Physical Therapy

## 2022-02-08 ENCOUNTER — Ambulatory Visit: Payer: Medicare Other | Attending: Family Medicine | Admitting: Physical Therapy

## 2022-02-08 ENCOUNTER — Encounter: Payer: Self-pay | Admitting: Physical Therapy

## 2022-02-08 DIAGNOSIS — M25562 Pain in left knee: Secondary | ICD-10-CM | POA: Insufficient documentation

## 2022-02-08 DIAGNOSIS — M25662 Stiffness of left knee, not elsewhere classified: Secondary | ICD-10-CM | POA: Diagnosis present

## 2022-02-08 DIAGNOSIS — R262 Difficulty in walking, not elsewhere classified: Secondary | ICD-10-CM | POA: Diagnosis present

## 2022-02-08 DIAGNOSIS — M545 Low back pain, unspecified: Secondary | ICD-10-CM | POA: Diagnosis present

## 2022-02-08 DIAGNOSIS — M546 Pain in thoracic spine: Secondary | ICD-10-CM | POA: Insufficient documentation

## 2022-02-08 DIAGNOSIS — M5412 Radiculopathy, cervical region: Secondary | ICD-10-CM | POA: Insufficient documentation

## 2022-02-08 DIAGNOSIS — R293 Abnormal posture: Secondary | ICD-10-CM | POA: Diagnosis present

## 2022-02-08 DIAGNOSIS — R600 Localized edema: Secondary | ICD-10-CM | POA: Insufficient documentation

## 2022-02-08 DIAGNOSIS — M6281 Muscle weakness (generalized): Secondary | ICD-10-CM | POA: Insufficient documentation

## 2022-02-08 NOTE — Therapy (Signed)
[facility] ?[address] ?[place], [place], [address] ?Phone: [phone]   Fax:  [phone] ? ?Physical Therapy Treatment ? ?Patient Details  ?Name: Jennifer Foley ?MRN: [id number] ?Date of Birth: December 26, 1955 ?Referring Provider (PT): Tammy Arn Medal ? ? ?Encounter Date: 02/08/2022 ? ? PT End of Session - 02/08/22 1641   ? ? Visit Number 3   ? Number of Visits 9   ? Date for PT Re-Evaluation 01/31/22   ? Authorization Type UHC   ? Authorization Time Period 01/03/22 to 01/31/22   ? Progress Note Due on Visit 10   ? PT Start Time 1612   Patient was 12 mins late.  ? PT Stop Time 1643   ? PT Time Calculation (min) 31 min   ? Activity Tolerance Patient tolerated treatment well   ? Behavior During Therapy [facility] for tasks assessed/performed   ? ?  ?  ? ?  ? ? ?Past Medical History:  ?Diagnosis Date  ? Anemia   ? Arthritis   ? per patient, in left and right knee and both hands  ? Atrial fibrillation ([place])   ? Bilateral breast cysts   ? CHF (congestive heart failure) ([place])   ? Chronic kidney disease   ? stage 3  ? Depression with anxiety   ? DM (diabetes mellitus) ([place])   ? Esophageal reflux   ? History of hiatal hernia   ? HTN (hypertension)   ? Hyperlipemia   ? Hyperthyroidism   ? Hypothyroidism   ? INR (international normal ratio) abnormal   ? MRSA (methicillin resistant staph aureus) culture positive   ? Panuveitis   ? Pneumonia   ? PTSD (post-traumatic stress disorder)   ? Sleep apnea   ? per patient has CPAP, wears it at night sometimes  ? ? ?Past Surgical History:  ?Procedure Laterality Date  ? ABDOMINAL HYSTERECTOMY    ? BREAST BIOPSY    ? CATARACT EXTRACTION  2018  ? CESAREAN SECTION    ? DENTAL SURGERY    ? EYE SURGERY    ? FOOT SURGERY    ? HERNIA REPAIR    ? KNEE ARTHROSCOPY Left 07/16/2020  ? Procedure: ARTHROSCOPY KNEE;  Surgeon: [name], MD;  Location: [facility];  Service: Orthopedics;  Laterality: Left;  ? KNEE CLOSED REDUCTION Left 07/16/2020  ?  Procedure: LEFT KNEE MANIPULATION UNDER ANESTHESIA;  Surgeon: [name], MD;  Location: [facility];  Service: Orthopedics;  Laterality: Left;  ? KNEE SURGERY    ? TONSILLECTOMY AND ADENOIDECTOMY    ? TOTAL KNEE ARTHROPLASTY Left 04/21/2020  ? Procedure: LEFT TOTAL KNEE ARTHROPLASTY;  Surgeon: [name], MD;  Location: [facility];  Service: Orthopedics;  Laterality: Left;  ? ? ?There were no vitals filed for this visit. ? ? Subjective Assessment - 02/08/22 1615   ? ? Subjective I had a breast procedure done so I'm sore so I can't do a lot with my upper half today. I was debating whether or not to come in that's why I was late.   ? Patient Stated Goals feel better and walk straighter, get rid of pain as much as we can   ? Currently in Pain? Yes   ? Pain Score 7    ? Pain Location Back   ? Pain Orientation Lower;Mid   ? Pain Descriptors / Indicators Sharp;Burning;Nagging;Constant   ? ?  ?  ? ?  ? ? ? ? ? ? ? ? ? ? ? ? ? ? ? ? ? ? ? ?  [place] Adult PT Treatment/Exercise - 02/08/22 0001   ? ?  ? Lumbar Exercises: Aerobic  ? Nustep lvl 4 x 5 mins   ?  ? Lumbar Exercises: Standing  ? Other Standing Lumbar Exercises hip ext w/ red TB 2x10   ?  ? Lumbar Exercises: Seated  ? Long [employer] on Chair AROM;Strengthening;Both;2 sets;10 reps   ? Other Seated Lumbar Exercises ball squeezes 2x10   ? Other Seated Lumbar Exercises clamshells red TB 2x10   marching w/ red TB 2x10 on each leg  ?  ? Shoulder Exercises: Seated  ? Other Seated Exercises STS 2x10, 1 UE   ? ?  ?  ? ?  ? ? ? ? ? ? ? ? ? ? ? ? PT Short Term Goals - 01/03/22 1630   ? ?  ? PT SHORT TERM GOAL #1  ? Title Will be compliant with appropriate progressive HEP   ? Time 2   ? Period Weeks   ? Status New   ? Target Date 01/17/22   ?  ? PT SHORT TERM GOAL #2  ? Title Pain to be no more than 5/10 at worst   ? ?  ?  ? ?  ? ? ? ? PT Long Term Goals - 01/03/22 1633   ? ?  ? PT LONG TERM GOAL #1  ? Title MMT to have improved by 1 grade in all weak groups   ? Time 4   ? Period Weeks    ? Status New   ? Target Date 01/31/22   ?  ? PT LONG TERM GOAL #2  ? Title Cervical, thoracic, and lumbar mobility to have improved by 50%   ? Time 4   ? Period Weeks   ? Status New   ?  ? PT LONG TERM GOAL #3  ? Title Will be able to access community to do grocery shopping and go to class without significnat incresae in pain   ? Time 4   ? Period Weeks   ? Status New   ?  ? PT LONG TERM GOAL #4  ? Title FOTO score to have improved by at least 10 points to show improvement in condition   ? Time 4   ? Period Weeks   ? Status New   ? ?  ?  ? ?  ? ? ? ? ? ? ? ? Plan - 02/08/22 1644   ? ? Clinical Impression Statement Patient stated she had a breast procedure done and was unable to do shoulder exercises due to soreness and pain. Focused on strengthening back for today's session.VC's needed for correct posture w/ hip ext.   ? Personal Factors and Comorbidities Behavior Pattern;Past/Current Experience;Fitness;Comorbidity 3+;Social Background;Time since onset of injury/illness/exacerbation   ? Examination-Activity Limitations Locomotion Level;Transfers;Bed Mobility;Sit;Carry;Squat;Stairs;Stand;Lift   ? Examination-Participation Restrictions Church;Cleaning;Occupation;Community Activity;Shop;Yard Work   ? Stability/Clinical Decision Making Evolving/Moderate complexity   ? Clinical Decision Making Moderate   ? Rehab Potential Fair   ? PT Frequency 2x / week   ? PT Duration 4 weeks   ? PT Treatment/Interventions ADLs/Self Care Home Management;Cryotherapy;Electrical Stimulation;Iontophoresis '4mg'$ /ml Dexamethasone;Moist Heat;Traction;Ultrasound;DME Instruction;Gait training;Stair training;Functional mobility training;Therapeutic activities;Therapeutic exercise;Balance training;Neuromuscular re-education;Patient/family education;Manual techniques;Passive range of motion;Dry needling;Energy conservation;Taping;Spinal Manipulations   ? PT Next Visit Plan general postural training, trunk mobility and postural strength, biomechanics    ? PT Home Exercise Plan [email or web address]   ? Consulted and Agree with Plan of Care Patient   ? ?  ?  ? ?  ? ? ?  Patient will benefit from skilled therapeutic intervention in order to improve the following deficits and impairments:  Abnormal gait, Decreased range of motion, Difficulty walking, Increased fascial restricitons, Increased muscle spasms, Impaired UE functional use, Obesity, Decreased activity tolerance, Impaired perceived functional ability, Pain, Hypomobility, Impaired flexibility, Improper body mechanics, Decreased mobility, Postural dysfunction, Decreased strength ? ?Visit Diagnosis: ?Pain in thoracic spine ? ?Radiculopathy, cervical region ? ?Acute left-sided low back pain, unspecified whether sciatica present ? ?Abnormal posture ? ?Muscle weakness (generalized) ? ?Localized edema ? ?Difficulty in walking, not elsewhere classified ? ?Stiffness of left knee, not elsewhere classified ? ?Acute pain of left knee ? ? ? ? ?Problem List ?Patient Active Problem List  ? Diagnosis Date Noted  ? Impingement syndrome of right shoulder 03/29/2021  ? Arthrofibrosis of knee joint, left 06/24/2020  ? S/P total knee arthroplasty, left 06/02/2020  ? Noncompliance with CPAP treatment 09/24/2018  ? A-fib ([place]) 08/31/2018  ? CHF (congestive heart failure) ([place]) 08/31/2018  ? Acute pyelonephritis 08/31/2018  ? Sepsis secondary to UTI ([place]) 08/31/2018  ? Leucocytosis 08/31/2018  ? Hypothyroidism 08/31/2018  ? CKD (chronic kidney disease) stage 3, GFR 30-59 ml/min (HCC) 08/31/2018  ? HTN (hypertension) 08/31/2018  ? HLD (hyperlipidemia) 08/31/2018  ? Anxiety and depression 08/31/2018  ? Obesity 08/31/2018  ? Sepsis ([place]) 08/31/2018  ? Back pain 08/31/2018  ? Obstructive sleep apnea treated with continuous positive airway pressure (CPAP) 08/13/2018  ? ? ?Tavis Kring Chauncey Cruel ?02/08/2022, 4:47 PM ? ?[facility] ?[address] ?[place], [place], [address] ?Phone: [phone]   Fax:   [phone] ? ?Name: Kate Larock ?MRN: [id number] ?Date of Birth: 1956-04-07 ? ? ? ?

## 2022-02-13 ENCOUNTER — Ambulatory Visit: Payer: Medicare Other | Admitting: Physical Therapy

## 2022-02-15 ENCOUNTER — Ambulatory Visit: Payer: Medicare Other | Admitting: Physical Therapy

## 2022-02-16 ENCOUNTER — Ambulatory Visit: Payer: Medicare Other | Admitting: Physical Therapy

## 2022-03-07 ENCOUNTER — Ambulatory Visit: Payer: Medicare Other | Attending: Family Medicine | Admitting: Physical Therapy

## 2022-03-07 ENCOUNTER — Encounter: Payer: Self-pay | Admitting: Physical Therapy

## 2022-03-07 DIAGNOSIS — M5412 Radiculopathy, cervical region: Secondary | ICD-10-CM | POA: Diagnosis present

## 2022-03-07 DIAGNOSIS — M546 Pain in thoracic spine: Secondary | ICD-10-CM | POA: Diagnosis present

## 2022-03-07 DIAGNOSIS — M6281 Muscle weakness (generalized): Secondary | ICD-10-CM | POA: Diagnosis present

## 2022-03-07 DIAGNOSIS — M545 Low back pain, unspecified: Secondary | ICD-10-CM | POA: Diagnosis present

## 2022-03-07 DIAGNOSIS — R293 Abnormal posture: Secondary | ICD-10-CM | POA: Insufficient documentation
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
## 2022-03-22 ENCOUNTER — Encounter: Payer: Self-pay | Admitting: Physical Therapy

## 2022-03-22 ENCOUNTER — Ambulatory Visit: Payer: Medicare Other | Admitting: Physical Therapy

## 2022-03-22 DIAGNOSIS — M546 Pain in thoracic spine: Secondary | ICD-10-CM

## 2022-03-22 DIAGNOSIS — M5412 Radiculopathy, cervical region: Secondary | ICD-10-CM

## 2022-03-22 DIAGNOSIS — M545 Low back pain, unspecified: Secondary | ICD-10-CM
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
## 2022-03-23 ENCOUNTER — Ambulatory Visit: Payer: Self-pay | Admitting: General Surgery

## 2022-03-23 DIAGNOSIS — N6324 Unspecified lump in the left breast, lower inner quadrant: Secondary | ICD-10-CM

## 2022-03-24 ENCOUNTER — Other Ambulatory Visit: Payer: Self-pay | Admitting: General Surgery

## 2022-03-24 DIAGNOSIS — N6324 Unspecified lump in the left breast, lower inner quadrant: Secondary | ICD-10-CM

## 2022-03-29 ENCOUNTER — Ambulatory Visit: Payer: Medicare Other | Attending: Family Medicine | Admitting: Physical Therapy

## 2022-03-29 ENCOUNTER — Encounter: Payer: Self-pay | Admitting: Physical Therapy

## 2022-03-29 DIAGNOSIS — M5412 Radiculopathy, cervical region: Secondary | ICD-10-CM | POA: Diagnosis present

## 2022-03-29 DIAGNOSIS — M546 Pain in thoracic spine: Secondary | ICD-10-CM | POA: Diagnosis present

## 2022-03-29 DIAGNOSIS — M545 Low back pain, unspecified: Secondary | ICD-10-CM | POA: Insufficient documentation

## 2022-03-29 DIAGNOSIS — R293 Abnormal posture: Secondary | ICD-10-CM | POA: Diagnosis present
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
## 2022-04-03 ENCOUNTER — Ambulatory Visit: Payer: Medicare Other | Admitting: Physical Therapy

## 2022-04-11 ENCOUNTER — Ambulatory Visit: Payer: Medicare Other | Admitting: Orthopaedic Surgery

## 2022-04-13 NOTE — Progress Notes (Signed)
Lelon Huh at Dr. Ethlyn Gallery office to request cardiac clearance and eliquis orders.

## 2022-04-14 ENCOUNTER — Other Ambulatory Visit: Payer: Self-pay

## 2022-04-14 ENCOUNTER — Encounter (HOSPITAL_BASED_OUTPATIENT_CLINIC_OR_DEPARTMENT_OTHER): Payer: Self-pay | Admitting: General Surgery

## 2022-04-19 ENCOUNTER — Ambulatory Visit
Admission: RE | Admit: 2022-04-19 | Discharge: 2022-04-19 | Disposition: A | Discharge status: Home / Self Care | Payer: Medicare Other | Source: Ambulatory Visit | Attending: General Surgery | Admitting: General Surgery

## 2022-04-19 ENCOUNTER — Encounter (HOSPITAL_BASED_OUTPATIENT_CLINIC_OR_DEPARTMENT_OTHER)
Admission: RE | Admit: 2022-04-19 | Discharge: 2022-04-19 | Disposition: A | Discharge status: Home / Self Care | Payer: Medicare Other | Source: Ambulatory Visit | Attending: General Surgery | Admitting: General Surgery

## 2022-04-19 DIAGNOSIS — E039 Hypothyroidism, unspecified: Secondary | ICD-10-CM | POA: Diagnosis not present

## 2022-04-19 DIAGNOSIS — D242 Benign neoplasm of left breast: Secondary | ICD-10-CM | POA: Diagnosis present

## 2022-04-19 DIAGNOSIS — Z79899 Other long term (current) drug therapy: Secondary | ICD-10-CM | POA: Diagnosis not present

## 2022-04-19 DIAGNOSIS — N6324 Unspecified lump in the left breast, lower inner quadrant: Secondary | ICD-10-CM

## 2022-04-19 DIAGNOSIS — K219 Gastro-esophageal reflux disease without esophagitis: Secondary | ICD-10-CM | POA: Diagnosis not present

## 2022-04-19 DIAGNOSIS — G4730 Sleep apnea, unspecified: Secondary | ICD-10-CM | POA: Diagnosis not present

## 2022-04-19 DIAGNOSIS — Z87891 Personal history of nicotine dependence: Secondary | ICD-10-CM | POA: Diagnosis not present

## 2022-04-19 DIAGNOSIS — E119 Type 2 diabetes mellitus without complications: Secondary | ICD-10-CM | POA: Diagnosis not present

## 2022-04-19 DIAGNOSIS — I110 Hypertensive heart disease with heart failure: Secondary | ICD-10-CM | POA: Diagnosis not present

## 2022-04-19 DIAGNOSIS — I509 Heart failure, unspecified: Secondary | ICD-10-CM | POA: Diagnosis not present

## 2022-04-19 DIAGNOSIS — Z6839 Body mass index (BMI) 39.0-39.9, adult: Secondary | ICD-10-CM | POA: Diagnosis not present

## 2022-04-19 DIAGNOSIS — D252 Subserosal leiomyoma of uterus: Secondary | ICD-10-CM | POA: Diagnosis not present

## 2022-04-19 LAB — BASIC METABOLIC PANEL
Anion gap: 11 (ref 5–15)
BUN: 18 mg/dL (ref 8–23)
CO2: 29 mmol/L (ref 22–32)
Calcium: 9.3 mg/dL (ref 8.9–10.3)
Chloride: 104 mmol/L (ref 98–111)
Creatinine, Ser: 1.58 mg/dL — ABNORMAL HIGH (ref 0.44–1.00)
GFR, Estimated: 36 mL/min — ABNORMAL LOW (ref >60)
Glucose, Bld: 127 mg/dL — ABNORMAL HIGH (ref 70–99)
Potassium: 4.2 mmol/L (ref 3.5–5.1)
Sodium: 144 mmol/L (ref 135–145)

## 2022-04-19 NOTE — Anesthesia Preprocedure Evaluation (Signed)
Anesthesia Evaluation  Patient identified by MRN, date of birth, ID band Patient awake    Reviewed: Allergy & Precautions, NPO status , Patient's Chart, lab work & pertinent test results, reviewed documented beta blocker date and time   History of Anesthesia Complications Negative for: history of anesthetic complications  Airway Mallampati: II  TM Distance: >3 FB Neck ROM: Full    Dental  (+) Upper Dentures, Lower Dentures   Pulmonary sleep apnea , former smoker,    Pulmonary exam normal        Cardiovascular hypertension, Pt. on home beta blockers and Pt. on medications +CHF  Normal cardiovascular exam   '20 TTE - Mild mitral regurgitation. Ejection fraction is visually estimated at 40-45%. Moderate global LV hypokinesis. Grade I diastolic dysfunction.      Neuro/Psych PSYCHIATRIC DISORDERS Anxiety Depression negative neurological ROS     GI/Hepatic Neg liver ROS, hiatal hernia, GERD  Medicated and Controlled,  Endo/Other  diabetes, Type 2, Insulin DependentHypothyroidism Morbid obesity  Renal/GU CRFRenal disease     Musculoskeletal  (+) Arthritis ,   Abdominal   Peds  Hematology  On eliquis    Anesthesia Other Findings   Reproductive/Obstetrics                            Anesthesia Physical Anesthesia Plan  ASA: 3  Anesthesia Plan: General   Post-op Pain Management: Tylenol PO (pre-op)*   Induction: Intravenous  PONV Risk Score and Plan: 3 and Treatment may vary due to age or medical condition, Ondansetron and Dexamethasone  Airway Management Planned: LMA  Additional Equipment: None  Intra-op Plan:   Post-operative Plan: Extubation in OR  Informed Consent: I have reviewed the patients History and Physical, chart, labs and discussed the procedure including the risks, benefits and alternatives for the proposed anesthesia with the patient or authorized representative who  has indicated his/her understanding and acceptance.     Dental advisory given  Plan Discussed with: CRNA and Anesthesiologist  Anesthesia Plan Comments:        Anesthesia Quick Evaluation

## 2022-04-19 NOTE — Progress Notes (Signed)
Text message sent and LVM with patient to come in for pre op blood work and EKG

## 2022-04-20 ENCOUNTER — Ambulatory Visit (HOSPITAL_BASED_OUTPATIENT_CLINIC_OR_DEPARTMENT_OTHER): Payer: Medicare Other | Admitting: Anesthesiology

## 2022-04-20 ENCOUNTER — Encounter (HOSPITAL_BASED_OUTPATIENT_CLINIC_OR_DEPARTMENT_OTHER)
Admission: RE | Disposition: A | Discharge status: Home / Self Care | Payer: Self-pay | Source: Home / Self Care | Attending: General Surgery

## 2022-04-20 ENCOUNTER — Other Ambulatory Visit: Payer: Self-pay

## 2022-04-20 ENCOUNTER — Ambulatory Visit (HOSPITAL_BASED_OUTPATIENT_CLINIC_OR_DEPARTMENT_OTHER)
Admission: RE | Admit: 2022-04-20 | Discharge: 2022-04-20 | Disposition: A | Discharge status: Home / Self Care | Payer: Medicare Other | Attending: General Surgery | Admitting: General Surgery

## 2022-04-20 ENCOUNTER — Encounter (HOSPITAL_BASED_OUTPATIENT_CLINIC_OR_DEPARTMENT_OTHER): Payer: Self-pay | Admitting: General Surgery

## 2022-04-20 ENCOUNTER — Ambulatory Visit
Admission: RE | Admit: 2022-04-20 | Discharge: 2022-04-20 | Disposition: A | Discharge status: Home / Self Care | Payer: Medicare Other | Source: Ambulatory Visit | Attending: General Surgery | Admitting: General Surgery

## 2022-04-20 DIAGNOSIS — E119 Type 2 diabetes mellitus without complications: Secondary | ICD-10-CM | POA: Insufficient documentation

## 2022-04-20 DIAGNOSIS — Z79899 Other long term (current) drug therapy: Secondary | ICD-10-CM | POA: Insufficient documentation

## 2022-04-20 DIAGNOSIS — I509 Heart failure, unspecified: Secondary | ICD-10-CM | POA: Diagnosis not present

## 2022-04-20 DIAGNOSIS — G4730 Sleep apnea, unspecified: Secondary | ICD-10-CM | POA: Diagnosis not present

## 2022-04-20 DIAGNOSIS — D252 Subserosal leiomyoma of uterus: Secondary | ICD-10-CM | POA: Insufficient documentation

## 2022-04-20 DIAGNOSIS — Z87891 Personal history of nicotine dependence: Secondary | ICD-10-CM | POA: Insufficient documentation

## 2022-04-20 DIAGNOSIS — K219 Gastro-esophageal reflux disease without esophagitis: Secondary | ICD-10-CM | POA: Insufficient documentation

## 2022-04-20 DIAGNOSIS — I110 Hypertensive heart disease with heart failure: Secondary | ICD-10-CM | POA: Insufficient documentation

## 2022-04-20 DIAGNOSIS — I10 Essential (primary) hypertension: Secondary | ICD-10-CM

## 2022-04-20 DIAGNOSIS — N6320 Unspecified lump in the left breast, unspecified quadrant: Secondary | ICD-10-CM | POA: Diagnosis not present

## 2022-04-20 DIAGNOSIS — N6324 Unspecified lump in the left breast, lower inner quadrant: Secondary | ICD-10-CM

## 2022-04-20 DIAGNOSIS — E039 Hypothyroidism, unspecified: Secondary | ICD-10-CM | POA: Insufficient documentation

## 2022-04-20 DIAGNOSIS — Z6839 Body mass index (BMI) 39.0-39.9, adult: Secondary | ICD-10-CM | POA: Insufficient documentation

## 2022-04-20 HISTORY — PX: BREAST LUMPECTOMY WITH RADIOACTIVE SEED LOCALIZATION: SHX6424

## 2022-04-20 LAB — GLUCOSE, CAPILLARY
Glucose-Capillary: 100 mg/dL — ABNORMAL HIGH (ref 70–99)
Glucose-Capillary: 132 mg/dL — ABNORMAL HIGH (ref 70–99)

## 2022-04-20 SURGERY — BREAST LUMPECTOMY WITH RADIOACTIVE SEED LOCALIZATION
Anesthesia: General | Site: Breast | Laterality: Left

## 2022-04-20 MED ORDER — ONDANSETRON HCL 4 MG/2ML IJ SOLN
INTRAMUSCULAR | Status: DC | PRN
  Administered 2022-04-20: 4 mg via INTRAVENOUS

## 2022-04-20 MED ORDER — CHLORHEXIDINE GLUCONATE CLOTH 2 % EX PADS: 6.0000 | MEDICATED_PAD | Freq: Once | CUTANEOUS | Status: DC

## 2022-04-20 MED ORDER — BUPIVACAINE-EPINEPHRINE (PF) 0.25% -1:200000 IJ SOLN
INTRAMUSCULAR | Status: DC | PRN
  Administered 2022-04-20: 20 mL

## 2022-04-20 MED ORDER — BUPIVACAINE-EPINEPHRINE (PF) 0.5% -1:200000 IJ SOLN
INTRAMUSCULAR | Status: AC
  Filled 2022-04-20: qty 60

## 2022-04-20 MED ORDER — CELECOXIB 200 MG PO CAPS
ORAL_CAPSULE | ORAL | Status: AC
  Filled 2022-04-20: qty 1

## 2022-04-20 MED ORDER — CELECOXIB 100 MG PO CAPS
ORAL_CAPSULE | ORAL | Status: AC
  Filled 2022-04-20: qty 1

## 2022-04-20 MED ORDER — PROPOFOL 10 MG/ML IV BOLUS
INTRAVENOUS | Status: DC | PRN
  Administered 2022-04-20: 150 mg via INTRAVENOUS

## 2022-04-20 MED ORDER — OXYCODONE HCL 5 MG PO TABS
5.0000 mg | ORAL_TABLET | Freq: Once | ORAL | Status: AC | PRN
  Administered 2022-04-20: 5 mg via ORAL

## 2022-04-20 MED ORDER — PHENYLEPHRINE 80 MCG/ML (10ML) SYRINGE FOR IV PUSH (FOR BLOOD PRESSURE SUPPORT)
PREFILLED_SYRINGE | INTRAVENOUS | Status: AC
  Filled 2022-04-20: qty 10

## 2022-04-20 MED ORDER — EPHEDRINE 5 MG/ML INJ
INTRAVENOUS | Status: AC
  Filled 2022-04-20: qty 5

## 2022-04-20 MED ORDER — ACETAMINOPHEN 500 MG PO TABS
ORAL_TABLET | ORAL | Status: AC
  Filled 2022-04-20: qty 2

## 2022-04-20 MED ORDER — TRAMADOL HCL 50 MG PO TABS: 50.0000 mg | ORAL_TABLET | Freq: Four times a day (QID) | ORAL | 0 refills | Status: AC | PRN

## 2022-04-20 MED ORDER — LIDOCAINE 2% (20 MG/ML) 5 ML SYRINGE
INTRAMUSCULAR | Status: AC
  Filled 2022-04-20: qty 5

## 2022-04-20 MED ORDER — GABAPENTIN 300 MG PO CAPS
ORAL_CAPSULE | ORAL | Status: AC
  Filled 2022-04-20: qty 1

## 2022-04-20 MED ORDER — MIDAZOLAM HCL 5 MG/5ML IJ SOLN
INTRAMUSCULAR | Status: DC | PRN
  Administered 2022-04-20: .5 mg via INTRAVENOUS

## 2022-04-20 MED ORDER — VANCOMYCIN HCL IN DEXTROSE 1-5 GM/200ML-% IV SOLN
1000.0000 mg | INTRAVENOUS | Status: AC
  Administered 2022-04-20: 1000 mg via INTRAVENOUS

## 2022-04-20 MED ORDER — FENTANYL CITRATE (PF) 100 MCG/2ML IJ SOLN
INTRAMUSCULAR | Status: AC
  Filled 2022-04-20: qty 2

## 2022-04-20 MED ORDER — VANCOMYCIN HCL IN DEXTROSE 1-5 GM/200ML-% IV SOLN
INTRAVENOUS | Status: AC
  Filled 2022-04-20: qty 200

## 2022-04-20 MED ORDER — ACETAMINOPHEN 500 MG PO TABS
1000.0000 mg | ORAL_TABLET | Freq: Once | ORAL | Status: AC
Start: 2022-04-20 — End: 2022-04-20
  Administered 2022-04-20: 1000 mg via ORAL

## 2022-04-20 MED ORDER — BUPIVACAINE-EPINEPHRINE (PF) 0.25% -1:200000 IJ SOLN
INTRAMUSCULAR | Status: AC
  Filled 2022-04-20: qty 60

## 2022-04-20 MED ORDER — LACTATED RINGERS IV SOLN: INTRAVENOUS | Status: DC

## 2022-04-20 MED ORDER — LIDOCAINE HCL (CARDIAC) PF 100 MG/5ML IV SOSY
PREFILLED_SYRINGE | INTRAVENOUS | Status: DC | PRN
  Administered 2022-04-20: 60 mg via INTRAVENOUS

## 2022-04-20 MED ORDER — CELECOXIB 100 MG PO CAPS
100.0000 mg | ORAL_CAPSULE | ORAL | Status: AC
  Administered 2022-04-20: 100 mg via ORAL

## 2022-04-20 MED ORDER — FENTANYL CITRATE (PF) 100 MCG/2ML IJ SOLN
INTRAMUSCULAR | Status: DC | PRN
Start: 2022-04-20 — End: 2022-04-20
  Administered 2022-04-20: 50 ug via INTRAVENOUS

## 2022-04-20 MED ORDER — OXYCODONE HCL 5 MG/5ML PO SOLN: 5.0000 mg | Freq: Once | ORAL | Status: AC | PRN

## 2022-04-20 MED ORDER — FENTANYL CITRATE (PF) 100 MCG/2ML IJ SOLN
25.0000 ug | INTRAMUSCULAR | Status: DC | PRN
  Administered 2022-04-20 (×2): 25 ug via INTRAVENOUS

## 2022-04-20 MED ORDER — GABAPENTIN 100 MG PO CAPS
ORAL_CAPSULE | ORAL | Status: AC
  Filled 2022-04-20: qty 1

## 2022-04-20 MED ORDER — MIDAZOLAM HCL 2 MG/2ML IJ SOLN
INTRAMUSCULAR | Status: AC
  Filled 2022-04-20: qty 2

## 2022-04-20 MED ORDER — EPHEDRINE SULFATE (PRESSORS) 50 MG/ML IJ SOLN
INTRAMUSCULAR | Status: DC | PRN
  Administered 2022-04-20: 10 mg via INTRAVENOUS

## 2022-04-20 MED ORDER — ONDANSETRON HCL 4 MG/2ML IJ SOLN
INTRAMUSCULAR | Status: AC
  Filled 2022-04-20: qty 2

## 2022-04-20 MED ORDER — OXYCODONE HCL 5 MG PO TABS
ORAL_TABLET | ORAL | Status: AC
  Filled 2022-04-20: qty 1

## 2022-04-20 MED ORDER — ATROPINE SULFATE 0.4 MG/ML IV SOLN
INTRAVENOUS | Status: AC
  Filled 2022-04-20: qty 1

## 2022-04-20 MED ORDER — SUCCINYLCHOLINE CHLORIDE 200 MG/10ML IV SOSY
PREFILLED_SYRINGE | INTRAVENOUS | Status: AC
  Filled 2022-04-20: qty 10

## 2022-04-20 MED ORDER — GABAPENTIN 100 MG PO CAPS
100.0000 mg | ORAL_CAPSULE | ORAL | Status: AC
  Administered 2022-04-20: 100 mg via ORAL

## 2022-04-20 MED ORDER — ACETAMINOPHEN 500 MG PO TABS: 1000.0000 mg | ORAL_TABLET | ORAL | Status: DC

## 2022-04-20 MED ORDER — DEXAMETHASONE SODIUM PHOSPHATE 4 MG/ML IJ SOLN
INTRAMUSCULAR | Status: DC | PRN
  Administered 2022-04-20: 5 mg via INTRAVENOUS

## 2022-04-20 MED ORDER — ONDANSETRON HCL 4 MG/2ML IJ SOLN: 4.0000 mg | Freq: Once | INTRAMUSCULAR | Status: DC | PRN

## 2022-04-20 MED ORDER — DEXAMETHASONE SODIUM PHOSPHATE 10 MG/ML IJ SOLN
INTRAMUSCULAR | Status: AC
  Filled 2022-04-20: qty 1

## 2022-04-20 SURGICAL SUPPLY — 45 items
ADH SKN CLS APL DERMABOND .7 (GAUZE/BANDAGES/DRESSINGS) ×1
APL PRP STRL LF DISP 70% ISPRP (MISCELLANEOUS) ×1
APPLIER CLIP 9.375 MED OPEN (MISCELLANEOUS)
APR CLP MED 9.3 20 MLT OPN (MISCELLANEOUS)
BLADE SURG 15 STRL LF DISP TIS (BLADE) ×1 IMPLANT
BLADE SURG 15 STRL SS (BLADE) ×2
CANISTER SUC SOCK COL 7IN (MISCELLANEOUS) ×2 IMPLANT
CANISTER SUCT 1200ML W/VALVE (MISCELLANEOUS) ×2 IMPLANT
CHLORAPREP W/TINT 26 (MISCELLANEOUS) ×2 IMPLANT
CLIP APPLIE 9.375 MED OPEN (MISCELLANEOUS) IMPLANT
COVER BACK TABLE 60X90IN (DRAPES) ×2 IMPLANT
COVER MAYO STAND STRL (DRAPES) ×2 IMPLANT
COVER PROBE W GEL 5X96 (DRAPES) ×2 IMPLANT
DERMABOND ADVANCED (GAUZE/BANDAGES/DRESSINGS) ×1
DERMABOND ADVANCED .7 DNX12 (GAUZE/BANDAGES/DRESSINGS) ×1 IMPLANT
DRAPE LAPAROSCOPIC ABDOMINAL (DRAPES) ×2 IMPLANT
DRAPE UTILITY XL STRL (DRAPES) ×2 IMPLANT
ELECT COATED BLADE 2.86 ST (ELECTRODE) ×2 IMPLANT
ELECT REM PT RETURN 9FT ADLT (ELECTROSURGICAL) ×2
ELECTRODE REM PT RTRN 9FT ADLT (ELECTROSURGICAL) ×1 IMPLANT
GLOVE BIO SURGEON STRL SZ7.5 (GLOVE) ×3 IMPLANT
GLOVE BIOGEL PI IND STRL 6.5 (GLOVE) IMPLANT
GLOVE BIOGEL PI IND STRL 7.0 (GLOVE) IMPLANT
GLOVE BIOGEL PI INDICATOR 6.5 (GLOVE) ×2
GLOVE BIOGEL PI INDICATOR 7.0 (GLOVE) ×1
GLOVE SURG SS PI 6.5 STRL IVOR (GLOVE) ×1 IMPLANT
GLOVE SURG SS PI 7.0 STRL IVOR (GLOVE) ×1 IMPLANT
GOWN STRL REUS W/ TWL LRG LVL3 (GOWN DISPOSABLE) ×2 IMPLANT
GOWN STRL REUS W/TWL LRG LVL3 (GOWN DISPOSABLE) ×6
KIT MARKER MARGIN INK (KITS) ×2 IMPLANT
NDL HYPO 25X1 1.5 SAFETY (NEEDLE) IMPLANT
NEEDLE HYPO 25X1 1.5 SAFETY (NEEDLE) ×2 IMPLANT
NS IRRIG 1000ML POUR BTL (IV SOLUTION) ×1 IMPLANT
PACK BASIN DAY SURGERY FS (CUSTOM PROCEDURE TRAY) ×2 IMPLANT
PENCIL SMOKE EVACUATOR (MISCELLANEOUS) ×2 IMPLANT
SLEEVE SCD COMPRESS KNEE MED (STOCKING) ×2 IMPLANT
SPONGE T-LAP 18X18 ~~LOC~~+RFID (SPONGE) ×2 IMPLANT
SUT MON AB 4-0 PC3 18 (SUTURE) ×2 IMPLANT
SUT SILK 2 0 SH (SUTURE) IMPLANT
SUT VICRYL 3-0 CR8 SH (SUTURE) ×2 IMPLANT
SYR CONTROL 10ML LL (SYRINGE) ×1 IMPLANT
TOWEL GREEN STERILE FF (TOWEL DISPOSABLE) ×2 IMPLANT
TRAY FAXITRON CT DISP (TRAY / TRAY PROCEDURE) ×2 IMPLANT
TUBE CONNECTING 20X1/4 (TUBING) ×1 IMPLANT
YANKAUER SUCT BULB TIP NO VENT (SUCTIONS) IMPLANT

## 2022-04-20 NOTE — Transfer of Care (Signed)
Immediate Anesthesia Transfer of Care Note  Patient: Jennifer Foley  Procedure(s) Performed: LEFT BREAST LUMPECTOMY WITH RADIOACTIVE SEED LOCALIZATION (Left: Breast)  Patient Location: PACU  Anesthesia Type:General  Level of Consciousness: awake, alert , oriented, drowsy and patient cooperative  Airway & Oxygen Therapy: Patient Spontanous Breathing and Patient connected to face mask oxygen  Post-op Assessment: Report given to RN  Post vital signs: Reviewed and stable  Last Vitals:  Vitals Value Taken Time  BP    Temp    Pulse    Resp    SpO2      Last Pain:  Vitals:   04/20/22 0724  PainSc: 0-No pain      Patients Stated Pain Goal: 3 (21/58/72 7618)  Complications: No notable events documented.

## 2022-04-20 NOTE — Anesthesia Postprocedure Evaluation (Signed)
Anesthesia Post Note  Patient: Jennifer Foley  Procedure(s) Performed: LEFT BREAST LUMPECTOMY WITH RADIOACTIVE SEED LOCALIZATION (Left: Breast)     Patient location during evaluation: PACU Anesthesia Type: General Level of consciousness: awake and alert Pain management: pain level controlled Vital Signs Assessment: post-procedure vital signs reviewed and stable Respiratory status: spontaneous breathing, nonlabored ventilation and respiratory function stable Cardiovascular status: stable and blood pressure returned to baseline Anesthetic complications: no   No notable events documented.  Last Vitals:  Vitals:   04/20/22 1015 04/20/22 1016  BP:  132/64  Pulse:  66  Resp: 13 12  Temp:  (!) 36.3 C  SpO2:  96%    Last Pain:  Vitals:   04/20/22 1016  PainSc: [place]

## 2022-04-20 NOTE — H&P (Signed)
REFERRING PHYSICIAN: Allyson Sabal, *  PROVIDER: Landry Corporal, MD  MRN: [id number] DOB: 1956/01/22 Subjective   Chief Complaint: New Patient (Left breast spindle )   History of Present Illness: Jennifer Foley is a 66 y.o. female who is seen today as an office consultation for evaluation of New Patient (Left breast spindle ) .   We are asked to see the patient in consultation by Dr. Emmit Pomfret to evaluate her for a mass in the left breast. The patient is a 66 year old black female who recently went for a routine screening mammogram. At that time she was found to have an 8 mm mass in the lower inner quadrant of the left breast. 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 The pathologist could not tell the difference between a fibroadenoma and something more significant like a fibromatosis. She does have underlying A-fib and other cardiac issues and is followed by Dr. Terrence Dupont. She does take Eliquis  Review of Systems: A complete review of systems was obtained from the patient. I have reviewed this information and discussed as appropriate with the patient. See HPI as well for other ROS.  ROS   Medical History: History reviewed. No pertinent past medical history.  Patient Active Problem List  Diagnosis   Mass of lower inner quadrant of left breast   History reviewed. No pertinent surgical history.   Allergies  Allergen Reactions   Albuterol Rash  Pt states she had a rash from it once at [facility]   Codeine Rash   Other Rash  Vegetable dye caused severe rash, broke out with hives. Gelt like she was on fire.   Current Outpatient Medications on File Prior to Visit  Medication Sig Dispense Refill   buPROPion (WELLBUTRIN XL) 150 MG XL tablet Take 1 tablet by mouth once daily   FUROsemide (LASIX) 40 MG tablet Take 1 tablet by mouth once daily   pen needle, diabetic 32 gauge x 5/32" Ndle USE 1 THREE TIMES DAILY   prednisoLONE acetate (PRED  FORTE) 1 % ophthalmic suspension Place 1 drop into the right eye daily.   rosuvastatin (CRESTOR) 5 MG tablet Take 1 tablet by mouth once daily for high cholesterol.   tiZANidine (ZANAFLEX) 2 MG tablet 1-2 every 6 hours prn   traMADoL (ULTRAM) 50 mg tablet Take by mouth every 8 (eight) hours as needed   methocarbamoL (ROBAXIN) 500 MG tablet Take 500 mg by mouth 2 (two) times daily   rivaroxaban (XARELTO) 15 mg tablet   No current facility-administered medications on file prior to visit.   History reviewed. No pertinent family history.   Social History   Tobacco Use  Smoking Status Former   Types: Cigarettes  Smokeless Tobacco Never    Social History   Socioeconomic History   Marital status: Legally Separated  Tobacco Use   Smoking status: Former  Types: Cigarettes   Smokeless tobacco: Never  Substance and Sexual Activity   Alcohol use: Never   Drug use: Never   Objective:   Vitals:  BP: 122/76  Pulse: 80  Temp: 36.7 C (98 F)  SpO2: 97%  Weight: 90.9 kg (200 lb 6.4 oz)  Height: 152.4 cm (5')   Body mass index is 39.14 kg/m.  Physical Exam Vitals reviewed.  Constitutional:  General: She is not in acute distress. Appearance: Normal appearance.  HENT:  Head: Normocephalic and atraumatic.  Right Ear: External ear normal.  Left Ear: External ear normal.  Nose:  Nose normal.  Mouth/Throat:  Mouth: Mucous membranes are moist.  Pharynx: Oropharynx is clear.  Eyes:  General: No scleral icterus. Extraocular Movements: Extraocular movements intact.  Conjunctiva/sclera: Conjunctivae normal.  Pupils: Pupils are equal, round, and reactive to light.  Cardiovascular:  Rate and Rhythm: Normal rate and regular rhythm.  Pulses: Normal pulses.  Heart sounds: Normal heart sounds.  Pulmonary:  Effort: Pulmonary effort is normal. No respiratory distress.  Breath sounds: Normal breath sounds.  Abdominal:  General: Bowel sounds are normal.  Palpations: Abdomen is soft.   Tenderness: There is no abdominal tenderness.  Musculoskeletal:  General: No swelling, tenderness or deformity. Normal range of motion.  Cervical back: Normal range of motion and neck supple.  Skin: General: Skin is warm and dry.  Coloration: Skin is not jaundiced.  Neurological:  General: No focal deficit present.  Mental Status: She is alert and oriented to person, place, and time.  Psychiatric:  Mood and Affect: Mood normal.  Behavior: Behavior normal.     Breast: There is no palpable mass in either breast. There is no palpable axillary, supraclavicular, or cervical lymphadenopathy.  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 Because we cannot confidently characterize the lesion the recommendation is to have this area removed. I have discussed with her in detail the risks and benefits of the operation as well as some of the technical aspects including the use of a radioactive seed for localization and she understands and wishes to proceed. She will need cardiac clearance from her cardiologist to schedule surgery and she will need to be off her blood thinner.

## 2022-04-20 NOTE — Discharge Instructions (Signed)
  Post Anesthesia Home Care Instructions  Activity: Get plenty of rest for the remainder of the day. A responsible individual must stay with you for 24 hours following the procedure.  For the next 24 hours, DO NOT: -Drive a car -Paediatric nurse -Drink alcoholic beverages -Take any medication unless instructed by your physician -Make any legal decisions or sign important papers.  Meals: Start with liquid foods such as gelatin or soup. Progress to regular foods as tolerated. Avoid greasy, spicy, heavy foods. If nausea and/or vomiting occur, drink only clear liquids until the nausea and/or vomiting subsides. Call your physician if vomiting continues.  Special Instructions/Symptoms: Your throat may feel dry or sore from the anesthesia or the breathing tube placed in your throat during surgery. If this causes discomfort, gargle with warm salt water. The discomfort should disappear within 24 hours.  *May have Tylenol or Ibuprofen at 1:30pm today 04/20/22

## 2022-04-20 NOTE — Op Note (Signed)
04/20/2022  9:08 AM  PATIENT:  Jennifer Foley  66 y.o. female  PRE-OPERATIVE DIAGNOSIS:  LEFT BREAST MASS  POST-OPERATIVE DIAGNOSIS:  LEFT BREAST MASS  PROCEDURE:  Procedure(s): LEFT BREAST LUMPECTOMY WITH RADIOACTIVE SEED LOCALIZATION (Left)  SURGEON:  Surgeon(s) and Role:    * Jovita Kussmaul, MD - Primary  PHYSICIAN ASSISTANT:   ASSISTANTS: none   ANESTHESIA:   local and general  EBL:  10 mL   BLOOD ADMINISTERED:none  DRAINS: none   LOCAL MEDICATIONS USED:  MARCAINE     SPECIMEN:  Source of Specimen:  left breast tissue  DISPOSITION OF SPECIMEN:  PATHOLOGY  COUNTS:  YES  TOURNIQUET:  * No tourniquets in log *  DICTATION: .Dragon Dictation  After informed consent was obtained the patient was brought to the operating room and was placed in the supine position on the operating table.  After adequate induction of general anesthesia the patient's left breast was prepped with ChloraPrep, allowed to dry, and draped in usual sterile Foley.  An appropriate timeout was performed.  Previously an I-125 seed was placed in the outer aspect of the left breast to mark an area of spindle cell lesion that was poorly characterized.  The neoprobe was then set to I-125 in the area of radioactivity was readily identified.  The area around this was infiltrated with quarter percent Marcaine.  Curvilinear incision was made along the outer aspect of the left breast with a 15 blade knife.  The incision was carried through the skin and subcutaneous tissue sharply with the electrocautery.  Dissection was then carried towards the radioactive seed under the direction of the neoprobe.  Once I more closely approached the radioactive seed I then removed a circular portion of breast tissue sharply with the electrocautery around the radioactive seed while checking the area of radioactivity frequently.  Once the specimen was removed it was oriented with the appropriate paint colors.  A specimen radiograph was  obtained that showed the clip and seed to be within the specimen.  The specimen was then sent to pathology for further evaluation.  Hemostasis was achieved using the Bovie electrocautery.  The wound was irrigated with saline and infiltrated with more quarter percent Marcaine.  The deep layer of the wound was then closed with layers of interrupted 3-0 Vicryl stitches.  The skin was then closed with running 4-0 Monocryl subcuticular stitch.  Dermabond dressings were applied.  The patient tolerated the procedure well.  At the end of the case all needle sponge and instrument counts were correct.  The patient was then awakened and taken to recovery in stable condition.  PLAN OF CARE: Discharge to home after PACU  PATIENT DISPOSITION:  PACU - hemodynamically stable.   Delay start of Pharmacological VTE agent (>24hrs) due to surgical blood loss or risk of bleeding: not applicable

## 2022-04-20 NOTE — Interval H&P Note (Signed)
History and Physical Interval Note:  04/20/2022 8:13 AM  Jennifer Foley  has presented today for surgery, with the diagnosis of LEFT BREAST MASS.  The various methods of treatment have been discussed with the patient and family. After consideration of risks, benefits and other options for treatment, the patient has consented to  Procedure(s): LEFT BREAST LUMPECTOMY WITH RADIOACTIVE SEED LOCALIZATION (Left) as a surgical intervention.  The patient's history has been reviewed, patient examined, no change in status, stable for surgery.  I have reviewed the patient's chart and labs.  Questions were answered to the patient's satisfaction.     Autumn Messing III

## 2022-04-20 NOTE — Anesthesia Procedure Notes (Signed)
Procedure Name: LMA Insertion Date/Time: 04/20/2022 8:36 AM  Performed by: Willa Frater, CRNAPre-anesthesia Checklist: Patient identified, Emergency Drugs available, Suction available and Patient being monitored Patient Re-evaluated:Patient Re-evaluated prior to induction Oxygen Delivery Method: Circle system utilized Preoxygenation: Pre-oxygenation with 100% oxygen Induction Type: IV induction Ventilation: Mask ventilation without difficulty LMA: LMA inserted LMA Size: 4.0 Number of attempts: 1 Airway Equipment and Method: Bite block Placement Confirmation: positive ETCO2 Tube secured with: Tape Dental Injury: Teeth and Oropharynx as per pre-operative assessment

## 2022-04-21 ENCOUNTER — Encounter (HOSPITAL_BASED_OUTPATIENT_CLINIC_OR_DEPARTMENT_OTHER): Payer: Self-pay | Admitting: General Surgery

## 2022-04-27 ENCOUNTER — Encounter (HOSPITAL_COMMUNITY): Payer: Self-pay

## 2022-05-02 ENCOUNTER — Observation Stay (HOSPITAL_COMMUNITY)
Admission: EM | Admit: 2022-05-02 | Discharge: 2022-05-04 | Disposition: A | Discharge status: Home / Self Care | Payer: Medicare Other | Attending: Internal Medicine | Admitting: Internal Medicine

## 2022-05-02 ENCOUNTER — Other Ambulatory Visit: Payer: Self-pay

## 2022-05-02 ENCOUNTER — Encounter (HOSPITAL_COMMUNITY): Payer: Self-pay

## 2022-05-02 ENCOUNTER — Emergency Department (HOSPITAL_COMMUNITY): Payer: Medicare Other

## 2022-05-02 DIAGNOSIS — I509 Heart failure, unspecified: Secondary | ICD-10-CM | POA: Diagnosis not present

## 2022-05-02 DIAGNOSIS — E785 Hyperlipidemia, unspecified: Secondary | ICD-10-CM | POA: Diagnosis present

## 2022-05-02 DIAGNOSIS — Z79899 Other long term (current) drug therapy: Secondary | ICD-10-CM | POA: Insufficient documentation

## 2022-05-02 DIAGNOSIS — R079 Chest pain, unspecified: Secondary | ICD-10-CM | POA: Diagnosis present

## 2022-05-02 DIAGNOSIS — E039 Hypothyroidism, unspecified: Secondary | ICD-10-CM | POA: Insufficient documentation

## 2022-05-02 DIAGNOSIS — F17290 Nicotine dependence, other tobacco product, uncomplicated: Secondary | ICD-10-CM | POA: Insufficient documentation

## 2022-05-02 DIAGNOSIS — Z7901 Long term (current) use of anticoagulants: Secondary | ICD-10-CM | POA: Diagnosis not present

## 2022-05-02 DIAGNOSIS — E1122 Type 2 diabetes mellitus with diabetic chronic kidney disease: Secondary | ICD-10-CM | POA: Insufficient documentation

## 2022-05-02 DIAGNOSIS — I480 Paroxysmal atrial fibrillation: Secondary | ICD-10-CM | POA: Diagnosis not present

## 2022-05-02 DIAGNOSIS — I10 Essential (primary) hypertension: Secondary | ICD-10-CM | POA: Diagnosis present

## 2022-05-02 DIAGNOSIS — Z794 Long term (current) use of insulin: Secondary | ICD-10-CM | POA: Diagnosis not present

## 2022-05-02 DIAGNOSIS — R0789 Other chest pain: Secondary | ICD-10-CM | POA: Diagnosis not present

## 2022-05-02 DIAGNOSIS — I130 Hypertensive heart and chronic kidney disease with heart failure and stage 1 through stage 4 chronic kidney disease, or unspecified chronic kidney disease: Secondary | ICD-10-CM | POA: Insufficient documentation

## 2022-05-02 DIAGNOSIS — Z96652 Presence of left artificial knee joint: Secondary | ICD-10-CM | POA: Diagnosis not present

## 2022-05-02 DIAGNOSIS — N183 Chronic kidney disease, stage 3 unspecified: Secondary | ICD-10-CM | POA: Diagnosis present

## 2022-05-02 DIAGNOSIS — N1832 Chronic kidney disease, stage 3b: Secondary | ICD-10-CM | POA: Insufficient documentation

## 2022-05-02 DIAGNOSIS — I4891 Unspecified atrial fibrillation: Secondary | ICD-10-CM | POA: Diagnosis present

## 2022-05-02 LAB — BASIC METABOLIC PANEL
Anion gap: 10 (ref 5–15)
BUN: 15 mg/dL (ref 8–23)
CO2: 25 mmol/L (ref 22–32)
Calcium: 8.9 mg/dL (ref 8.9–10.3)
Chloride: 103 mmol/L (ref 98–111)
Creatinine, Ser: 1.63 mg/dL — ABNORMAL HIGH (ref 0.44–1.00)
GFR, Estimated: 35 mL/min — ABNORMAL LOW (ref >60)
Glucose, Bld: 120 mg/dL — ABNORMAL HIGH (ref 70–99)
Potassium: 3.6 mmol/L (ref 3.5–5.1)
Sodium: 138 mmol/L (ref 135–145)

## 2022-05-02 LAB — CBC
HCT: 40.7 % (ref 36.0–46.0)
Hemoglobin: 12.5 g/dL (ref 12.0–15.0)
MCH: 29.1 pg (ref 26.0–34.0)
MCHC: 30.7 g/dL (ref 30.0–36.0)
MCV: 94.9 fL (ref 80.0–100.0)
Platelets: 220 10*3/uL (ref 150–400)
RBC: 4.29 MIL/uL (ref 3.87–5.11)
RDW: 12.8 % (ref 11.5–15.5)
WBC: 6.8 10*3/uL (ref 4.0–10.5)
nRBC: 0 % (ref 0.0–0.2)

## 2022-05-02 LAB — TROPONIN I (HIGH SENSITIVITY)
Troponin I (High Sensitivity): 7 ng/L (ref <18)
Troponin I (High Sensitivity): 21 ng/L — ABNORMAL HIGH (ref <18)

## 2022-05-02 LAB — SURGICAL PATHOLOGY

## 2022-05-02 LAB — CBG MONITORING, ED: Glucose-Capillary: 87 mg/dL (ref 70–99)

## 2022-05-02 NOTE — ED Provider Triage Note (Signed)
Emergency Medicine Provider Triage Evaluation Note  Jennifer Foley , a 66 y.o. female with history of A-fib, CHF was evaluated in triage.  Pt complains of chest pain that began about 1.5 hours prior to arrival.  Patient states that she was driving home from the eye doctor and she had onset of left-sided chest pain that described as sharp.  Pain has been coming and going but has been increasing in severity when it does come.  While she was driving, she also had an episode of severe shortness of breath which made her feel lightheaded and "woozy".  After she called EMS, symptoms had improved.  She was given 324 milligram ASA by EMS.  Denies diaphoresis, abdominal pain, nausea, vomiting and diarrhea.  Review of Systems  Positive:  Negative: As above  Physical Exam  BP (!) 150/91   Pulse 75   Temp 98.2 F (36.8 C) (Oral)   Resp 18   Wt 91.2 kg   SpO2 97%   BMI 39.26 kg/m  Gen:   Awake, no distress   Resp:  Normal effort , mild expiratory wheezing bilaterally MSK:   Moves extremities without difficulty  Other:    Medical Decision Making  Medically screening exam initiated at 4:16 PM.  Appropriate orders placed.  [name] was informed that the remainder of the evaluation will be completed by another provider, this initial triage assessment does not replace that evaluation, and the importance of remaining in the ED until their evaluation is complete.     Tonye Pearson, [place] 05/02/22 1619

## 2022-05-02 NOTE — ED Triage Notes (Signed)
PER EMS: pt reports sudden onset central sharp chest pain today when driving associated with SOB. CP/SOB resolved shortly after EMS arrival. 324 asa given by EMS 18g LAC.  BP- 156/100, HR-84, NSR with PVCs, 98% Ra, 20RR

## 2022-05-03 ENCOUNTER — Observation Stay (HOSPITAL_BASED_OUTPATIENT_CLINIC_OR_DEPARTMENT_OTHER): Payer: Medicare Other

## 2022-05-03 ENCOUNTER — Encounter (HOSPITAL_COMMUNITY): Payer: Self-pay

## 2022-05-03 DIAGNOSIS — N1832 Chronic kidney disease, stage 3b: Secondary | ICD-10-CM

## 2022-05-03 DIAGNOSIS — R079 Chest pain, unspecified: Secondary | ICD-10-CM | POA: Diagnosis not present

## 2022-05-03 DIAGNOSIS — E785 Hyperlipidemia, unspecified: Secondary | ICD-10-CM

## 2022-05-03 DIAGNOSIS — I10 Essential (primary) hypertension: Secondary | ICD-10-CM

## 2022-05-03 LAB — BASIC METABOLIC PANEL
Anion gap: 10 (ref 5–15)
BUN: 16 mg/dL (ref 8–23)
CO2: 27 mmol/L (ref 22–32)
Calcium: 9 mg/dL (ref 8.9–10.3)
Chloride: 105 mmol/L (ref 98–111)
Creatinine, Ser: 1.56 mg/dL — ABNORMAL HIGH (ref 0.44–1.00)
GFR, Estimated: 36 mL/min — ABNORMAL LOW (ref >60)
Glucose, Bld: 105 mg/dL — ABNORMAL HIGH (ref 70–99)
Potassium: 3.9 mmol/L (ref 3.5–5.1)
Sodium: 142 mmol/L (ref 135–145)

## 2022-05-03 LAB — LIPID PANEL
Cholesterol: 140 mg/dL (ref 0–200)
HDL: 50 mg/dL (ref >40)
LDL Cholesterol: 75 mg/dL (ref 0–99)
Total CHOL/HDL Ratio: 2.8 RATIO
Triglycerides: 74 mg/dL (ref <150)
VLDL: 15 mg/dL (ref 0–40)

## 2022-05-03 LAB — CBC
HCT: 35.4 % — ABNORMAL LOW (ref 36.0–46.0)
Hemoglobin: 11.3 g/dL — ABNORMAL LOW (ref 12.0–15.0)
MCH: 29.5 pg (ref 26.0–34.0)
MCHC: 31.9 g/dL (ref 30.0–36.0)
MCV: 92.4 fL (ref 80.0–100.0)
Platelets: 199 10*3/uL (ref 150–400)
RBC: 3.83 MIL/uL — ABNORMAL LOW (ref 3.87–5.11)
RDW: 12.9 % (ref 11.5–15.5)
WBC: 7.2 10*3/uL (ref 4.0–10.5)
nRBC: 0 % (ref 0.0–0.2)

## 2022-05-03 LAB — MAGNESIUM: Magnesium: 1.9 mg/dL (ref 1.7–2.4)

## 2022-05-03 LAB — HEMOGLOBIN A1C
Hgb A1c MFr Bld: 5.9 % — ABNORMAL HIGH (ref 4.8–5.6)
Mean Plasma Glucose: 122.63 mg/dL

## 2022-05-03 LAB — ECHOCARDIOGRAM COMPLETE
Area-P 1/2: 3.37 cm2
S' Lateral: 2.9 cm
Weight: 3216 oz

## 2022-05-03 LAB — TROPONIN I (HIGH SENSITIVITY)
Troponin I (High Sensitivity): 10 ng/L (ref <18)
Troponin I (High Sensitivity): 7 ng/L (ref <18)

## 2022-05-03 LAB — HIV ANTIBODY (ROUTINE TESTING W REFLEX): HIV Screen 4th Generation wRfx: NONREACTIVE

## 2022-05-03 LAB — PHOSPHORUS: Phosphorus: 3.2 mg/dL (ref 2.5–4.6)

## 2022-05-03 MED ORDER — BUPROPION HCL ER (XL) 150 MG PO TB24
150.0000 mg | ORAL_TABLET | Freq: Every day | ORAL | Status: DC
  Administered 2022-05-04: 150 mg via ORAL
  Filled 2022-05-03: qty 1

## 2022-05-03 MED ORDER — MELATONIN 5 MG PO TABS: 5.0000 mg | ORAL_TABLET | Freq: Every evening | ORAL | Status: DC | PRN

## 2022-05-03 MED ORDER — APIXABAN 2.5 MG PO TABS
2.5000 mg | ORAL_TABLET | Freq: Two times a day (BID) | ORAL | Status: DC
  Administered 2022-05-03 – 2022-05-04 (×3): 2.5 mg via ORAL
  Filled 2022-05-03 (×3): qty 1

## 2022-05-03 MED ORDER — LIFITEGRAST 5 % OP SOLN
1.0000 [drp] | Freq: Two times a day (BID) | OPHTHALMIC | Status: DC
  Administered 2022-05-03 – 2022-05-04 (×2): 1 [drp] via OPHTHALMIC
  Filled 2022-05-03: qty 1

## 2022-05-03 MED ORDER — PANTOPRAZOLE SODIUM 40 MG PO TBEC
40.0000 mg | DELAYED_RELEASE_TABLET | Freq: Every day | ORAL | Status: DC
  Administered 2022-05-03 – 2022-05-04 (×2): 40 mg via ORAL
  Filled 2022-05-03 (×2): qty 1

## 2022-05-03 MED ORDER — LEVALBUTEROL TARTRATE 45 MCG/ACT IN AERO: 2.0000 | INHALATION_SPRAY | RESPIRATORY_TRACT | Status: DC | PRN

## 2022-05-03 MED ORDER — MYCOPHENOLATE MOFETIL 250 MG PO CAPS
250.0000 mg | ORAL_CAPSULE | Freq: Two times a day (BID) | ORAL | Status: DC
  Filled 2022-05-03: qty 1

## 2022-05-03 MED ORDER — GABAPENTIN 300 MG PO CAPS
300.0000 mg | ORAL_CAPSULE | Freq: Every day | ORAL | Status: DC
  Filled 2022-05-03: qty 1

## 2022-05-03 MED ORDER — ROSUVASTATIN CALCIUM 5 MG PO TABS
5.0000 mg | ORAL_TABLET | Freq: Every day | ORAL | Status: DC
  Administered 2022-05-03 – 2022-05-04 (×2): 5 mg via ORAL
  Filled 2022-05-03 (×2): qty 1

## 2022-05-03 MED ORDER — TRAMADOL HCL 50 MG PO TABS
50.0000 mg | ORAL_TABLET | Freq: Four times a day (QID) | ORAL | Status: DC | PRN
  Administered 2022-05-03: 50 mg via ORAL
  Filled 2022-05-03: qty 1

## 2022-05-03 MED ORDER — INSULIN ASPART 100 UNIT/ML IJ SOLN: 6.0000 [IU] | Freq: Three times a day (TID) | INTRAMUSCULAR | Status: DC

## 2022-05-03 MED ORDER — IPRATROPIUM BROMIDE 0.06 % NA SOLN: 1.0000 | Freq: Every day | NASAL | Status: DC | PRN

## 2022-05-03 MED ORDER — PREDNISOLONE ACETATE 1 % OP SUSP
1.0000 [drp] | Freq: Four times a day (QID) | OPHTHALMIC | Status: DC
  Administered 2022-05-03 – 2022-05-04 (×4): 1 [drp] via OPHTHALMIC
  Filled 2022-05-03: qty 5

## 2022-05-03 MED ORDER — HYDROMORPHONE HCL 1 MG/ML IJ SOLN: 0.5000 mg | INTRAMUSCULAR | Status: DC | PRN

## 2022-05-03 MED ORDER — METOPROLOL SUCCINATE ER 25 MG PO TB24
12.5000 mg | ORAL_TABLET | Freq: Every day | ORAL | Status: DC
  Administered 2022-05-03 – 2022-05-04 (×2): 12.5 mg via ORAL
  Filled 2022-05-03 (×2): qty 1

## 2022-05-03 MED ORDER — LIFITEGRAST 5 % OP SOLN: 1.0000 [drp] | OPHTHALMIC | Status: DC

## 2022-05-03 MED ORDER — ACETAMINOPHEN 325 MG PO TABS
650.0000 mg | ORAL_TABLET | Freq: Four times a day (QID) | ORAL | Status: DC | PRN
  Administered 2022-05-03 (×2): 650 mg via ORAL
  Filled 2022-05-03 (×2): qty 2

## 2022-05-03 MED ORDER — FLUTICASONE PROPIONATE 50 MCG/ACT NA SUSP
2.0000 | Freq: Every day | NASAL | Status: DC | PRN
Start: 2022-05-03 — End: 2022-05-04

## 2022-05-03 MED ORDER — ARTIFICIAL TEARS OPHTHALMIC OINT: TOPICAL_OINTMENT | OPHTHALMIC | Status: DC | PRN

## 2022-05-03 MED ORDER — BUSPIRONE HCL 5 MG PO TABS
10.0000 mg | ORAL_TABLET | Freq: Two times a day (BID) | ORAL | Status: DC
  Administered 2022-05-03 – 2022-05-04 (×3): 10 mg via ORAL
  Filled 2022-05-03 (×2): qty 2
  Filled 2022-05-03: qty 1

## 2022-05-03 MED ORDER — MOMETASONE FURO-FORMOTEROL FUM 200-5 MCG/ACT IN AERO
2.0000 | INHALATION_SPRAY | Freq: Two times a day (BID) | RESPIRATORY_TRACT | Status: DC
  Administered 2022-05-03 – 2022-05-04 (×2): 2 via RESPIRATORY_TRACT
  Filled 2022-05-03: qty 8.8

## 2022-05-03 MED ORDER — INSULIN GLARGINE-YFGN 100 UNIT/ML ~~LOC~~ SOLN
10.0000 [IU] | Freq: Every day | SUBCUTANEOUS | Status: DC
  Filled 2022-05-03 (×2): qty 0.1

## 2022-05-03 MED ORDER — POLYETHYLENE GLYCOL 3350 17 G PO PACK: 17.0000 g | PACK | Freq: Every day | ORAL | Status: DC | PRN

## 2022-05-03 MED ORDER — LEVOTHYROXINE SODIUM 50 MCG PO TABS
50.0000 ug | ORAL_TABLET | Freq: Every day | ORAL | Status: DC
  Administered 2022-05-03 – 2022-05-04 (×2): 50 ug via ORAL
  Filled 2022-05-03: qty 2
  Filled 2022-05-03: qty 1

## 2022-05-03 MED ORDER — MYCOPHENOLATE MOFETIL 250 MG PO CAPS
250.0000 mg | ORAL_CAPSULE | Freq: Every day | ORAL | Status: DC
  Administered 2022-05-04: 250 mg via ORAL
  Filled 2022-05-03: qty 1

## 2022-05-03 MED ORDER — TIZANIDINE HCL 2 MG PO TABS
2.0000 mg | ORAL_TABLET | Freq: Four times a day (QID) | ORAL | Status: DC | PRN
Start: 2022-05-03 — End: 2022-05-04

## 2022-05-03 MED ORDER — POTASSIUM CHLORIDE CRYS ER 10 MEQ PO TBCR
10.0000 meq | EXTENDED_RELEASE_TABLET | Freq: Every day | ORAL | Status: DC
  Administered 2022-05-03 – 2022-05-04 (×2): 10 meq via ORAL
  Filled 2022-05-03 (×2): qty 1

## 2022-05-03 NOTE — ED Notes (Signed)
Pt requests omeprazole before breakfast. No order for omeprazole, provider contacted via secure message

## 2022-05-03 NOTE — Progress Notes (Signed)
Received call about Xiidra (lifitegrast) 5% eye drops being home medication. It is not on formulary.   Patient had supply of medication at beside.   Pharmacist evaluated medication - confirmed dose and expiration date of medication.   Will be allowed to keep at bedside and patient aware needs to be administered with nursing present.   Antonietta Jewel, PharmD, [place] Clinical Pharmacist  Phone: [phone] 05/03/2022 5:35 PM  Please check AMION for all [place] phone numbers After 10:00 PM, call [place] [phone]

## 2022-05-03 NOTE — ED Provider Notes (Signed)
[facility] Emergency Department Provider Note MRN:  [id number]  Arrival date & time: 05/03/22     Chief Complaint   Chest Pain   History of Present Illness   Jennifer Foley is a 66 y.o. year-old female with a history of CHF, diabetes, CKD presenting to the ED with chief complaint of chest pain.  Chest pain and shortness of breath on and off today, at 1 point was quite severe.  Feeling better at this time.  Denies dizziness or sweatiness, no nausea vomiting, no recent fever or cough.  Had a lumpectomy last month, during which her Eliquis was stopped for 3 days.  Review of Systems  A thorough review of systems was obtained and all systems are negative except as noted in the HPI and PMH.   Patient's Health History    Past Medical History:  Diagnosis Date   Anemia    Arthritis    per patient, in left and right knee and both hands   Atrial fibrillation (HCC)    Bilateral breast cysts    CHF (congestive heart failure) (HCC)    Chronic kidney disease    stage 3   Depression with anxiety    DM (diabetes mellitus) (HCC)    Esophageal reflux    History of hiatal hernia    HTN (hypertension)    Hyperlipemia    Hyperthyroidism    Hypothyroidism    INR (international normal ratio) abnormal    MRSA (methicillin resistant staph aureus) culture positive    Panuveitis    Pneumonia    PTSD (post-traumatic stress disorder)    Sleep apnea    per patient has CPAP, wears it at night sometimes    Past Surgical History:  Procedure Laterality Date   ABDOMINAL HYSTERECTOMY     BREAST BIOPSY     BREAST LUMPECTOMY WITH RADIOACTIVE SEED LOCALIZATION Left 04/20/2022   Procedure: LEFT BREAST LUMPECTOMY WITH RADIOACTIVE SEED LOCALIZATION;  Surgeon: Jovita Kussmaul, MD;  Location: [place];  Service: General;  Laterality: Left;   CATARACT EXTRACTION  2018   CESAREAN SECTION     DENTAL SURGERY     EYE SURGERY     FOOT SURGERY     HERNIA REPAIR      KNEE ARTHROSCOPY Left 07/16/2020   Procedure: ARTHROSCOPY KNEE;  Surgeon: [name], MD;  Location: [place];  Service: Orthopedics;  Laterality: Left;   KNEE CLOSED REDUCTION Left 07/16/2020   Procedure: LEFT KNEE MANIPULATION UNDER ANESTHESIA;  Surgeon: [name], MD;  Location: [place];  Service: Orthopedics;  Laterality: Left;   KNEE SURGERY     TONSILLECTOMY AND ADENOIDECTOMY     TOTAL KNEE ARTHROPLASTY Left 04/21/2020   Procedure: LEFT TOTAL KNEE ARTHROPLASTY;  Surgeon: [name], MD;  Location: [place];  Service: Orthopedics;  Laterality: Left;    Family History  Problem Relation Age of Onset   Alcoholism Father    Arthritis Father    Hypertension Father        sister, mother   Diabetes Mellitus I Mother        sister   Renal Disease Mother    Stroke Mother    Epilepsy Brother     Social History   Socioeconomic History   Marital status: Legally Separated    Spouse name: Not on file   Number of children: Not on file   Years of education: Not on file   Highest education level: Not on file  Occupational History   Not on file  Tobacco Use   Smoking status: Former    Packs/day: 0.25    Years: 34.00    Total pack years: 8.50    Types: Cigarettes    Quit date: 12/15/2003    Years since quitting: 18.3   Smokeless tobacco: Current    Types: Snuff    Last attempt to quit: 03/2020   Tobacco comments:    04/15/2020: per patient 1-2 times a day, trying to stop taking it  Vaping Use   Vaping Use: Never used  Substance and Sexual Activity   Alcohol use: Not Currently   Drug use: Never   Sexual activity: Not on file  Other Topics Concern   Not on file  Social History Narrative   Not on file   Social Determinants of Health   Financial Resource Strain: Not on file  Food Insecurity: Not on file  Transportation Needs: Not on file  Physical Activity: Not on file  Stress: Not on file  Social Connections: Not on file  Intimate Partner Violence: Not on file      Physical Exam   Vitals:   05/03/22 0315 05/03/22 0330  BP: 140/79 135/69  Pulse: 71 64  Resp: 19 11  Temp:    SpO2: 100% 100%    CONSTITUTIONAL: Well-appearing, NAD NEURO/PSYCH:  Alert and oriented x 3, no focal deficits EYES:  eyes equal and reactive ENT/NECK:  no LAD, no JVD CARDIO: Regular rate, well-perfused, normal S1 and S2 PULM:  CTAB no wheezing or rhonchi GI/GU:  non-distended, non-tender MSK/SPINE:  No gross deformities, no edema SKIN:  no rash, atraumatic   *Additional and/or pertinent findings included in MDM below  Diagnostic and Interventional Summary    EKG Interpretation  Date/Time:  Tuesday May 02 2022 15:48:19 EDT Ventricular Rate:  72 PR Interval:  120 QRS Duration: 76 QT Interval:  392 QTC Calculation: 429 R Axis:   31 Text Interpretation: Normal sinus rhythm Normal ECG When compared with ECG of 19-Apr-2022 14:55, PREVIOUS ECG IS PRESENT Confirmed by Gerlene Fee [phone]) on 05/03/2022 1:45:42 AM       Labs Reviewed  BASIC METABOLIC PANEL - Abnormal; Notable for the following components:      Result Value   Glucose, Bld 120 (*)    Creatinine, Ser 1.63 (*)    GFR, Estimated 35 (*)    All other components within normal limits  TROPONIN I (HIGH SENSITIVITY) - Abnormal; Notable for the following components:   Troponin I (High Sensitivity) 21 (*)    All other components within normal limits  CBC  HIV ANTIBODY (ROUTINE TESTING W REFLEX)  LIPID PANEL  HEMOGLOBIN H8I  BASIC METABOLIC PANEL  CBC  MAGNESIUM  PHOSPHORUS  CBG MONITORING, ED  TROPONIN I (HIGH SENSITIVITY)    DG Chest 2 View  Final Result      Medications - No data to display   Procedures  /  Critical Care Procedures  ED Course and Medical Decision Making  Initial Impression and Ddx Chest pain, shortness of breath, differential diagnosis includes ACS, less likely PE.  EKG is without ischemic findings.  First troponin is negative but second troponin is slightly elevated.   Will request hospitalist admission.  Past medical/surgical history that increases complexity of ED encounter: CHF  Interpretation of Diagnostics I personally reviewed the EKG and my interpretation is as follows: Sinus rhythm, no concerning ischemic features  Labs are overall reassuring with no significant blood count or electrolyte  disturbance, second troponin slightly elevated.  Patient Reassessment and Ultimate Disposition/Management     Admission to medicine.  Patient management required discussion with the following services or consulting groups:  Hospitalist Service  Complexity of Problems Addressed Acute illness or injury that poses threat of life of bodily function  Additional Data Reviewed and Analyzed Further history obtained from: Prior labs/imaging results  Additional Factors Impacting ED Encounter Risk Consideration of hospitalization  Barth Kirks. Sedonia Small, MD [place] [email or web address]  Final Clinical Impressions(s) / ED Diagnoses     ICD-10-CM   1. Chest pain, unspecified type  R07.9       ED Discharge Orders     None        Discharge Instructions Discussed with and Provided to Patient:   Discharge Instructions   None      Maudie Flakes, MD 05/03/22 [phone]

## 2022-05-03 NOTE — H&P (Signed)
History and Physical  Jennifer Foley JSE:[id number] DOB: [birth year] DOA: 05/02/2022  Referring physician: Dr. Sedonia Small  PCP: Bartholome Bill, MD  Outpatient Specialists: Cardiology, pulmonary, general surgery. Patient coming from: Home  Chief Complaint: Chest pain   HPI: Jennifer Foley is a 66 y.o. female with medical history significant for type 2 diabetes, essential hypertension, hyperlipidemia, hypothyroidism, paroxysmal A-fib on Eliquis, CKD 3B, left breast mass status post left breast lumpectomy (04/20/22) with radioactive seed localization.  Her home Eliquis was held for 3 days in anticipation for her lumpectomy, who presented to [facility] ED with complaints of sudden onset chest pain, centrally located, while driving.  Endorses she has had the chest pain at rest even prior to her lumpectomy.  The pain has been intermittent, lasting from a few seconds to a few minutes.  Associated with shortness of breath.  No aggravating factors.   EMS was activated.  She received a full dose aspirin 324 mg x 1 by EMS.  Shortly after EMS arrival, her chest pain and shortness of breath resolved.  She was hypertensive on presentation to the ED with BP 156/100.  Lab studies in the ED revealed high-sensitivity troponin uptrending from 7 to 21.  EDP requested admission for chest pain rule out ACS.  The patient was admitted by the hospitalist service, [facility].  ED Course: Tmax 98.2.  BP 139/67, pulse 68, respiratory 16, O2 saturation 100% on room air.  Serum glucose 120.  BUN 15, creatinine 1.63.  GFR 35.  High-sensitivity troponin 7, 21.  CBC unremarkable.  Review of Systems: Review of systems as noted in the HPI. All other systems reviewed and are negative.   Past Medical History:  Diagnosis Date   Anemia    Arthritis    per patient, in left and right knee and both hands   Atrial fibrillation (HCC)    Bilateral breast cysts    CHF (congestive heart failure) (HCC)    Chronic kidney disease    stage 3    Depression with anxiety    DM (diabetes mellitus) (HCC)    Esophageal reflux    History of hiatal hernia    HTN (hypertension)    Hyperlipemia    Hyperthyroidism    Hypothyroidism    INR (international normal ratio) abnormal    MRSA (methicillin resistant staph aureus) culture positive    Panuveitis    Pneumonia    PTSD (post-traumatic stress disorder)    Sleep apnea    per patient has CPAP, wears it at night sometimes   Past Surgical History:  Procedure Laterality Date   ABDOMINAL HYSTERECTOMY     BREAST BIOPSY     BREAST LUMPECTOMY WITH RADIOACTIVE SEED LOCALIZATION Left 04/20/2022   Procedure: LEFT BREAST LUMPECTOMY WITH RADIOACTIVE SEED LOCALIZATION;  Surgeon: Jovita Kussmaul, MD;  Location: [place];  Service: General;  Laterality: Left;   CATARACT EXTRACTION  2018   CESAREAN SECTION     DENTAL SURGERY     EYE SURGERY     FOOT SURGERY     HERNIA REPAIR     KNEE ARTHROSCOPY Left 07/16/2020   Procedure: ARTHROSCOPY KNEE;  Surgeon: [name], MD;  Location: [place];  Service: Orthopedics;  Laterality: Left;   KNEE CLOSED REDUCTION Left 07/16/2020   Procedure: LEFT KNEE MANIPULATION UNDER ANESTHESIA;  Surgeon: [name], MD;  Location: [place];  Service: Orthopedics;  Laterality: Left;   KNEE SURGERY     TONSILLECTOMY AND ADENOIDECTOMY  TOTAL KNEE ARTHROPLASTY Left 04/21/2020   Procedure: LEFT TOTAL KNEE ARTHROPLASTY;  Surgeon: [name], MD;  Location: [place];  Service: Orthopedics;  Laterality: Left;    Social History:  reports that she quit smoking about 18 years ago. Her smoking use included cigarettes. She has a 8.50 pack-year smoking history. Her smokeless tobacco use includes snuff. She reports that she does not currently use alcohol. She reports that she does not use drugs.   Allergies  Allergen Reactions   Contrast Media [Iodinated Contrast Media] Other (See Comments)    Unknown   Doxycycline Other (See Comments)    Unknown   Restasis  [Cyclosporine]     "Blurred vision"   Albuterol Rash    Pt states she [facility] a [facility]   Albuterol Sulfate Rash   Amoxicillin Rash   Codeine Rash   Erythromycin Base Diarrhea and Rash    Family History  Problem Relation Age of Onset   Alcoholism Father    Arthritis Father    Hypertension Father        sister, mother   Diabetes Mellitus I Mother        sister   Renal Disease Mother    Stroke Mother    Epilepsy Brother       Prior to Admission medications   Medication Sig Start Date End Date Taking? Authorizing Provider  acetaminophen (TYLENOL) 500 MG tablet Take 500 mg by mouth every 6 (six) hours as needed.    [provider]  Adalimumab 40 MG/0.8ML PNKT Inject 40 mg into the skin every 14 (fourteen) days.  01/31/18   [provider]  budesonide-formoterol (SYMBICORT) 160-4.5 MCG/ACT inhaler Inhale 2 puffs into the lungs daily as needed (shortness of breath).     [provider]  buPROPion (WELLBUTRIN XL) 150 MG 24 hr tablet Take 150 mg by mouth daily. 06/18/18   [provider]  busPIRone (BUSPAR) 10 MG tablet Take 10 mg by mouth 2 (two) times daily.     [provider]  ELIQUIS 2.5 MG TABS tablet Take 2.5 mg by mouth 2 (two) times daily.  06/07/20   [provider]  fluticasone (FLONASE) 50 MCG/ACT nasal spray Place 2 sprays into both nostrils daily as needed for allergies.     [provider]  furosemide (LASIX) 40 MG tablet Take 40 mg by mouth daily.    [provider]  insulin glargine (LANTUS) 100 UNIT/ML injection Inject 10 Units into the skin daily.    [provider]  insulin lispro (HUMALOG) 100 UNIT/ML injection Inject 6 Units into the skin 3 (three) times daily before meals.    [provider]  ipratropium (ATROVENT) 0.03 % nasal spray Place 1 spray into the nose daily as needed (rhinitis).    [provider]  levalbuterol Penne Lash HFA) 45 MCG/ACT inhaler  Inhale 2 puffs into the lungs every 4 (four) hours as needed for wheezing or shortness of breath.    [provider]  levothyroxine (SYNTHROID, LEVOTHROID) 50 MCG tablet Take 50 mcg by mouth daily before breakfast.    [provider]  lidocaine (LIDODERM) 5 % Place 1 patch onto the skin daily. Remove & Discard patch within 12 hours or as directed by MD 06/09/21   [name], MD  Lifitegrast Shirley Friar) 5 % SOLN Apply 1 drop to eye 1 day or 1 dose.    [provider]  metoprolol succinate (TOPROL-XL) 25 MG 24  hr tablet Take 25 mg by mouth daily.    [provider]  mycophenolate (CELLCEPT) 250 MG capsule Take by mouth 2 (two) times daily.    [provider]  omeprazole (PRILOSEC) 20 MG capsule Take 20 mg by mouth daily.    [provider]  potassium chloride (K-DUR,KLOR-CON) 10 MEQ tablet Take 10 mEq by mouth daily. 01/02/18   [provider]  prednisoLONE acetate (PRED FORTE) 1 % ophthalmic suspension Place 1 drop into the right eye in the morning and at bedtime.     [provider]  rosuvastatin (CRESTOR) 5 MG tablet Take 5 mg by mouth daily. 05/07/20   [provider]  tiZANidine (ZANAFLEX) 2 MG tablet Take by mouth every 6 (six) hours as needed for muscle spasms.    [provider]  traMADol (ULTRAM) 50 MG tablet Take by mouth every 6 (six) hours as needed.    [provider]  traMADol (ULTRAM) 50 MG tablet Take 1 tablet (50 mg total) by mouth every 6 (six) hours as needed. 04/20/22 04/20/23  Jovita Kussmaul, MD    Physical Exam: BP 135/69   Pulse 64   Temp 98 F (36.7 C) (Oral)   Resp 11   Wt 91.2 kg   SpO2 100%   BMI 39.26 kg/m   General: 66 y.o. year-old female well developed well nourished in no acute distress.  Alert and oriented x3. Cardiovascular: Regular rate and rhythm with no rubs or gallops.  No thyromegaly or JVD noted.  Trace lower extremity edema. 2/4 pulses in all 4  extremities. Respiratory: Clear to auscultation with no wheezes or rales. Good inspiratory effort. Abdomen: Soft nontender nondistended with normal bowel sounds x4 quadrants. Muskuloskeletal: No cyanosis or clubbing.  Trace edema noted in lower extremities bilaterally Neuro: CN II-XII intact, strength, sensation, reflexes Skin: No ulcerative lesions noted or rashes Psychiatry: Judgement and insight appear normal. Mood is appropriate for condition and setting          Labs on Admission:  Basic Metabolic Panel: Recent Labs  Lab 05/02/22 1609  NA 138  K 3.6  CL 103  CO2 25  GLUCOSE 120*  BUN 15  CREATININE 1.63*  CALCIUM 8.9   Liver Function Tests: No results for input(s): "AST", "ALT", "ALKPHOS", "BILITOT", "PROT", "ALBUMIN" in the last 168 hours. No results for input(s): "LIPASE", "AMYLASE" in the last 168 hours. No results for input(s): "AMMONIA" in the last 168 hours. CBC: Recent Labs  Lab 05/02/22 1609  WBC 6.8  HGB 12.5  HCT 40.7  MCV 94.9  PLT 220   Cardiac Enzymes: No results for input(s): "CKTOTAL", "CKMB", "CKMBINDEX", "TROPONINI" in the last 168 hours.  BNP (last 3 results) No results for input(s): "BNP" in the last 8760 hours.  ProBNP (last 3 results) No results for input(s): "PROBNP" in the last 8760 hours.  CBG: Recent Labs  Lab 05/02/22 2122  GLUCAP 87    Radiological Exams on Admission: DG Chest 2 View  Result Date: 05/02/2022 CLINICAL DATA:  Chest pain EXAM: CHEST - 2 VIEW COMPARISON:  chest x-ray dated February 19, 2019 FINDINGS: The heart size and mediastinal contours are within normal limits. Both lungs are clear. The visualized skeletal structures are unremarkable. IMPRESSION: No active cardiopulmonary disease. Electronically Signed   By: Yetta Glassman M.D.   On: 05/02/2022 16:44    EKG: I independently viewed the EKG done and my findings are as followed: Normal sinus rhythm rate of 72.  Nonspecific  ST-T changes.  QTc  429.  Assessment/Plan Present on Admission:  Chest pain  Principal Problem:   Chest pain  Atypical Chest pain rule out ACS Presented with sudden onset chest pain while she was driving, associated with shortness of breath.  Has been present for weeks and intermittent.  Lasting seconds to minutes.  No aggravating factors. In the ED, high-sensitivity troponin up-trended, 0.7 to 21 No evidence of acute ischemia on twelve-lead EKG. Obtain 2D echo Trend troponin Serial EKG Consult cardiology in the morning  Hyperlipidemia Fasting lipid panel in the morning Resume home Crestor  Paroxysmal A-fib on Eliquis Resume home Eliquis for CVA prevention Resume home Toprol-XL for rate control  Hypothyroidism Resume home levothyroxine.  CKD 3B Currently at her baseline renal function Avoid nephrotoxic agents and hypotension.  Prediabetes Last hemoglobin A1c 6.3 on 07/13/2020. Serum glucose on presentation 120 Obtain A1C   DVT prophylaxis: Home Eliquis  Code Status: Full code  Family Communication: None at bedside  Disposition Plan: Admitted to telemetry cardiac unit  Consults called: None.  Admission status: Observation status   Status is: Observation    Kayleen Memos MD [facility] Pager [phone]  If 7PM-7AM, please contact night-coverage [email or web address] Password [facility]  05/03/2022, 3:57 AM

## 2022-05-03 NOTE — Progress Notes (Addendum)
Same day note  Patient seen and examined at bedside.  Patient was admitted to the hospital for chest discomfort.  At the time of my evaluation, patient complains of no chest pain at this time or shortness of breath but had shortness of breath and chest pain yesterday.  Physical examination reveals obese female with mild pink left eye.  Tender left chest wall which is reproducible with palpation.  Laboratory data and imaging was reviewed  Assessment and Plan. Chest pain with shortness of breath.  None today.  Patient does have reproducible chest pain on palpation.  Shortness of breath and chest pain while driving yesterday.  Troponin mildly elevated.  EKG unremarkable.  Echo with LV ejection fraction of 50 to 55% with grade 1 diastolic dysfunction.  No regional wall motion abnormality.  Continue telemetry monitoring.  Hyperlipidemia continue Crestor.  Paroxysmal atrial fibrillation.  Continue Toprol and Eliquis.  Rate controlled at this time.  Hypothyroidism.  Continue Synthroid  CKD stage IIIb.  At baseline.  Prediabetes.  Last hemoglobin A1c of 6.3 on 07/13/2020.  Hemoglobin A1c at this time at 5.9. No Charge  Possible disposition home on 05/04/2022 tomorrow will continue to monitor in telemetry,  Signed,  Chloe Baig Hunt Oris, MD [facility]

## 2022-05-04 DIAGNOSIS — R079 Chest pain, unspecified: Secondary | ICD-10-CM | POA: Diagnosis not present

## 2022-05-04 DIAGNOSIS — N1832 Chronic kidney disease, stage 3b: Secondary | ICD-10-CM | POA: Diagnosis not present

## 2022-05-04 DIAGNOSIS — I10 Essential (primary) hypertension: Secondary | ICD-10-CM | POA: Diagnosis not present

## 2022-05-04 DIAGNOSIS — I480 Paroxysmal atrial fibrillation: Secondary | ICD-10-CM

## 2022-05-04 LAB — CBC
HCT: 34.6 % — ABNORMAL LOW (ref 36.0–46.0)
Hemoglobin: 11 g/dL — ABNORMAL LOW (ref 12.0–15.0)
MCH: 29.1 pg (ref 26.0–34.0)
MCHC: 31.8 g/dL (ref 30.0–36.0)
MCV: 91.5 fL (ref 80.0–100.0)
Platelets: 184 10*3/uL (ref 150–400)
RBC: 3.78 MIL/uL — ABNORMAL LOW (ref 3.87–5.11)
RDW: 12.6 % (ref 11.5–15.5)
WBC: 7 10*3/uL (ref 4.0–10.5)
nRBC: 0 % (ref 0.0–0.2)

## 2022-05-04 LAB — BASIC METABOLIC PANEL
Anion gap: 6 (ref 5–15)
BUN: 17 mg/dL (ref 8–23)
CO2: 24 mmol/L (ref 22–32)
Calcium: 8.8 mg/dL — ABNORMAL LOW (ref 8.9–10.3)
Chloride: 110 mmol/L (ref 98–111)
Creatinine, Ser: 1.48 mg/dL — ABNORMAL HIGH (ref 0.44–1.00)
GFR, Estimated: 39 mL/min — ABNORMAL LOW (ref >60)
Glucose, Bld: 97 mg/dL (ref 70–99)
Potassium: 4.2 mmol/L (ref 3.5–5.1)
Sodium: 140 mmol/L (ref 135–145)

## 2022-05-04 LAB — GLUCOSE, CAPILLARY: Glucose-Capillary: 92 mg/dL (ref 70–99)

## 2022-05-04 MED ORDER — ELIQUIS 2.5 MG PO TABS: 5.0000 mg | ORAL_TABLET | Freq: Two times a day (BID) | ORAL | 2 refills | Status: DC

## 2022-05-04 NOTE — Care Management (Signed)
  Transition of Care [facility]) Screening Note   Patient Details  Name: Jennifer Foley Date of Birth: 1956/02/28   Transition of Care [facility]) CM/SW Contact:    Bethena Roys, RN Phone Number: 05/04/2022, 9:58 AM    Transition of Care Department [facility]) has reviewed the patient and no TOC needs have been identified at this time. We will continue to monitor patient advancement through interdisciplinary progression rounds. If new patient transition needs arise, please place a TOC consult.

## 2022-05-04 NOTE — Care Management Obs Status (Signed)
[place] NOTIFICATION   Patient Details  Name: Jennifer Foley MRN: [id number] Date of Birth: 03/26/1956   Medicare Observation Status Notification Given:  Yes    Bethena Roys, RN 05/04/2022, 9:57 AM

## 2022-05-04 NOTE — Plan of Care (Signed)
  Problem: Education: Goal: Knowledge of General Education information will improve Description: Including pain rating scale, medication(s)/side effects and non-pharmacologic comfort measures Outcome: Progressing   Problem: Health Behavior/Discharge Planning: Goal: Ability to manage health-related needs will improve Outcome: Progressing   Problem: Clinical Measurements: Goal: Ability to maintain clinical measurements within normal limits will improve Outcome: Progressing Goal: Diagnostic test results will improve Outcome: Progressing Goal: Respiratory complications will improve Outcome: Progressing Goal: Cardiovascular complication will be avoided Outcome: Progressing   Problem: Activity: Goal: Risk for activity intolerance will decrease Outcome: Progressing   Problem: Nutrition: Goal: Adequate nutrition will be maintained Outcome: Progressing   Problem: Elimination: Goal: Will not experience complications related to urinary retention Outcome: Progressing   Problem: Pain Managment: Goal: General experience of comfort will improve Outcome: Progressing   Problem: Safety: Goal: Ability to remain free from injury will improve Outcome: Progressing   Problem: Skin Integrity: Goal: Risk for impaired skin integrity will decrease Outcome: Progressing   

## 2022-05-04 NOTE — Consult Note (Signed)
CARDIOLOGY CONSULT NOTE       Patient ID: Rayden Scheper MRN: [id number] DOB/AGE: September 10, 1956 66 y.o.  Admit date: 05/02/2022 Referring Physician: Karleen Hampshire Primary Physician: Bartholome Bill, MD Primary Cardiologist: Camnitz/Ross Reason for Consultation: Chest Pain  Principal Problem:   Chest pain Active Problems:   A-fib [facility])   CKD (chronic kidney disease) stage 3, GFR 30-59 ml/min (HCC)   HTN (hypertension)   HLD (hyperlipidemia)   HPI:  66 y.o. obese black female admitted with atypical chest pain 05/03/22 No history of CAD. History of PAF on eliquis and lopressor Recent left breast lumpectomy by Dr Marlou Starks on 6.29/23 Pain sharp , non exertional some dyspnea Occurred while driving Resolved since in hospital.  ECG normal no acute changes Troponin essentially negative low and no trend TTE with low normal EF 50-55% no RWMA;s. Has not had pain in hospital She is on statin for HLD   ROS All other systems reviewed and negative except as noted above  Past Medical History:  Diagnosis Date   Anemia    Arthritis    per patient, in left and right knee and both hands   Atrial fibrillation (HCC)    Bilateral breast cysts    CHF (congestive heart failure) (HCC)    Chronic kidney disease    stage 3   Depression with anxiety    DM (diabetes mellitus) (HCC)    Esophageal reflux    History of hiatal hernia    HTN (hypertension)    Hyperlipemia    Hyperthyroidism    Hypothyroidism    INR (international normal ratio) abnormal    MRSA (methicillin resistant staph aureus) culture positive    Panuveitis    Pneumonia    PTSD (post-traumatic stress disorder)    Sleep apnea    per patient has CPAP, wears it at night sometimes    Family History  Problem Relation Age of Onset   Alcoholism Father    Arthritis Father    Hypertension Father        sister, mother   Diabetes Mellitus I Mother        sister   Renal Disease Mother    Stroke Mother    Epilepsy Brother     Social  History   Socioeconomic History   Marital status: Legally Separated    Spouse name: Not on file   Number of children: Not on file   Years of education: Not on file   Highest education level: Not on file  Occupational History   Not on file  Tobacco Use   Smoking status: Former    Packs/day: 0.25    Years: 34.00    Total pack years: 8.50    Types: Cigarettes    Quit date: 12/15/2003    Years since quitting: 18.3   Smokeless tobacco: Current    Types: Snuff    Last attempt to quit: 03/2020   Tobacco comments:    04/15/2020: per patient 1-2 times a day, trying to stop taking it  Vaping Use   Vaping Use: Never used  Substance and Sexual Activity   Alcohol use: Not Currently   Drug use: Never   Sexual activity: Not on file  Other Topics Concern   Not on file  Social History Narrative   Not on file   Social Determinants of Health   Financial Resource Strain: Not on file  Food Insecurity: Not on file  Transportation Needs: Not on file  Physical Activity: Not on file  Stress:  Not on file  Social Connections: Not on file  Intimate Partner Violence: Not on file    Past Surgical History:  Procedure Laterality Date   ABDOMINAL HYSTERECTOMY     BREAST BIOPSY     BREAST LUMPECTOMY WITH RADIOACTIVE SEED LOCALIZATION Left 04/20/2022   Procedure: LEFT BREAST LUMPECTOMY WITH RADIOACTIVE SEED LOCALIZATION;  Surgeon: Jovita Kussmaul, MD;  Location: [place];  Service: General;  Laterality: Left;   CATARACT EXTRACTION  2018   CESAREAN SECTION     DENTAL SURGERY     EYE SURGERY     FOOT SURGERY     HERNIA REPAIR     KNEE ARTHROSCOPY Left 07/16/2020   Procedure: ARTHROSCOPY KNEE;  Surgeon: [name], MD;  Location: [place];  Service: Orthopedics;  Laterality: Left;   KNEE CLOSED REDUCTION Left 07/16/2020   Procedure: LEFT KNEE MANIPULATION UNDER ANESTHESIA;  Surgeon: [name], MD;  Location: [place];  Service: Orthopedics;  Laterality: Left;   KNEE SURGERY      TONSILLECTOMY AND ADENOIDECTOMY     TOTAL KNEE ARTHROPLASTY Left 04/21/2020   Procedure: LEFT TOTAL KNEE ARTHROPLASTY;  Surgeon: [name], MD;  Location: [place];  Service: Orthopedics;  Laterality: Left;      Current Facility-Administered Medications:    acetaminophen (TYLENOL) tablet 650 mg, 650 mg, Oral, Q6H PRN, Pokhrel, Laxman, MD, 650 mg at 05/03/22 2020   apixaban (ELIQUIS) tablet 2.5 mg, 2.5 mg, Oral, BID, Hall, Carole N, DO, 2.5 mg at 05/04/22 1039   artificial tears (LACRILUBE) ophthalmic ointment, , Both Eyes, Q4H PRN, Pokhrel, Laxman, MD   buPROPion (WELLBUTRIN XL) 24 hr tablet 150 mg, 150 mg, Oral, Daily, Hall, Carole N, DO, 150 mg at 05/04/22 1040   busPIRone (BUSPAR) tablet 10 mg, 10 mg, Oral, BID, Hall, Carole N, DO, 10 mg at 05/04/22 1040   fluticasone (FLONASE) 50 MCG/ACT nasal spray 2 spray, 2 spray, Each Nare, Daily PRN, Pokhrel, Laxman, MD   gabapentin (NEURONTIN) capsule 300 mg, 300 mg, Oral, QHS, Pokhrel, Laxman, MD   HYDROmorphone (DILAUDID) injection 0.5 mg, 0.5 mg, Intravenous, Q4H PRN, Hall, Carole N, DO   insulin aspart (novoLOG) injection 6 Units, 6 Units, Subcutaneous, TID AC, Pokhrel, Laxman, MD   insulin glargine-yfgn (SEMGLEE) injection 10 Units, 10 Units, Subcutaneous, Daily, Pokhrel, Laxman, MD   ipratropium (ATROVENT) 0.06 % nasal spray 1 spray, 1 spray, Nasal, Daily PRN, Pokhrel, Laxman, MD   levalbuterol (XOPENEX HFA) inhaler 2 puff, 2 puff, Inhalation, Q4H PRN, Pokhrel, Laxman, MD   levothyroxine (SYNTHROID) tablet 50 mcg, 50 mcg, Oral, Q0600, Irene Pap N, DO, 50 mcg at 05/04/22 0607   Lifitegrast 5 % SOLN 1 drop, 1 drop, Ophthalmic, BID, Pokhrel, Laxman, MD, 1 drop at 05/04/22 1040   melatonin tablet 5 mg, 5 mg, Oral, QHS PRN, [place] Crane, Carole N, DO   metoprolol succinate (TOPROL-XL) 24 hr tablet 12.5 mg, 12.5 mg, Oral, Daily, Hall, Carole N, DO, 12.5 mg at 05/04/22 1040   mometasone-formoterol (DULERA) 200-5 MCG/ACT inhaler 2 puff, 2 puff, Inhalation,  BID, Hall, Carole N, DO, 2 puff at 05/04/22 1884   mycophenolate (CELLCEPT) capsule 250 mg, 250 mg, Oral, Daily, Madueme, Elvira C, RPH, 250 mg at 05/04/22 0607   pantoprazole (PROTONIX) EC tablet 40 mg, 40 mg, Oral, Daily, Pokhrel, Laxman, MD, 40 mg at 05/04/22 0812   polyethylene glycol (MIRALAX / GLYCOLAX) packet 17 g, 17 g, Oral, Daily PRN, Hall, Carole N, DO   potassium chloride (KLOR-CON  M) CR tablet 10 mEq, 10 mEq, Oral, Daily, Pokhrel, Laxman, MD, 10 mEq at 05/04/22 1039   prednisoLONE acetate (PRED FORTE) 1 % ophthalmic suspension 1 drop, 1 drop, Right Eye, Q6H, Pokhrel, Laxman, MD, 1 drop at 05/04/22 0616   rosuvastatin (CRESTOR) tablet 5 mg, 5 mg, Oral, Daily, Hall, Carole N, DO, 5 mg at 05/04/22 1040   tiZANidine (ZANAFLEX) tablet 2 mg, 2 mg, Oral, Q6H PRN, Pokhrel, Laxman, MD   traMADol (ULTRAM) tablet 50 mg, 50 mg, Oral, Q6H PRN, Pokhrel, Laxman, MD, 50 mg at 05/03/22 1936  apixaban  2.5 mg Oral BID   buPROPion  150 mg Oral Daily   busPIRone  10 mg Oral BID   gabapentin  300 mg Oral QHS   insulin aspart  6 Units Subcutaneous TID AC   insulin glargine-yfgn  10 Units Subcutaneous Daily   levothyroxine  50 mcg Oral Q0600   Lifitegrast  1 drop Ophthalmic BID   metoprolol succinate  12.5 mg Oral Daily   mometasone-formoterol  2 puff Inhalation BID   mycophenolate  250 mg Oral Daily   pantoprazole  40 mg Oral Daily   potassium chloride  10 mEq Oral Daily   prednisoLONE acetate  1 drop Right Eye Q6H   rosuvastatin  5 mg Oral Daily     Physical Exam: Blood pressure (!) 123/55, pulse (!) 58, temperature 98.1 F (36.7 C), temperature source Oral, resp. rate 18, weight 91.2 kg, SpO2 100 %.    Affect appropriate Overweight black female  HEENT: normal Neck supple with no adenopathy JVP normal no bruits no thyromegaly Lungs clear with no wheezing and good diaphragmatic motion Heart:  S1/S2 no murmur, no rub, gallop or click PMI normal recent left breat surgery  Abdomen:  benighn, BS positve, no tenderness, no AAA no bruit.  No HSM or HJR Distal pulses intact with no bruits No edema Neuro non-focal Skin warm and dry No muscular weakness   Labs:   Lab Results  Component Value Date   WBC 7.0 05/04/2022   HGB 11.0 (L) 05/04/2022   HCT 34.6 (L) 05/04/2022   MCV 91.5 05/04/2022   PLT 184 05/04/2022    Recent Labs  Lab 05/04/22 0213  NA 140  K 4.2  CL 110  CO2 24  BUN 17  CREATININE 1.48*  CALCIUM 8.8*  GLUCOSE 97   No results found for: "CKTOTAL", "CKMB", "CKMBINDEX", "TROPONINI"  Lab Results  Component Value Date   CHOL 140 05/03/2022   Lab Results  Component Value Date   HDL 50 05/03/2022   Lab Results  Component Value Date   LDLCALC 75 05/03/2022   Lab Results  Component Value Date   TRIG 74 05/03/2022   Lab Results  Component Value Date   CHOLHDL 2.8 05/03/2022   No results found for: "LDLDIRECT"    Radiology: ECHOCARDIOGRAM COMPLETE  Result Date: 05/03/2022    ECHOCARDIOGRAM REPORT   Patient Name:   [name] Date of Exam: 05/03/2022 Medical Rec #:  [id number]       Height:       60.0 in Accession #:    [id number]      Weight:       201.0 lb Date of Birth:  01-01-1956       BSA:          1.871 m Patient Age:    37 years        BP:  122/77 mmHg Patient Gender: F               HR:           65 bpm. Exam Location:  Inpatient Procedure: 2D Echo, Cardiac Doppler and Color Doppler Indications:     Chest Pain R07.9  History:         Patient has no prior history of Echocardiogram examinations.                  CHF, Arrythmias:Atrial Fibrillation; Risk Factors:Hypertension,                  Dyslipidemia and Sleep Apnea.  Sonographer:     Ronny Flurry Sonographer#2:   Meagan Baucom RDCS, FE, PE Referring Phys:  Kayleen Memos Diagnosing Phys: Fransico Him MD IMPRESSIONS  1. Left ventricular ejection fraction, by estimation, is 50 to 55%. The left ventricle has low normal function. The left ventricle has no regional wall  motion abnormalities. Left ventricular diastolic parameters are consistent with Grade I diastolic dysfunction (impaired relaxation).  2. Right ventricular systolic function is normal. The right ventricular size is normal. Tricuspid regurgitation signal is inadequate for assessing PA pressure.  3. The mitral valve is degenerative. Trivial mitral valve regurgitation. No evidence of mitral stenosis.  4. The aortic valve is normal in structure. Aortic valve regurgitation is not visualized. No aortic stenosis is present.  5. The inferior vena cava is normal in size with greater than 50% respiratory variability, suggesting right atrial pressure of 3 mmHg. FINDINGS  Left Ventricle: Left ventricular ejection fraction, by estimation, is 50 to 55%. The left ventricle has low normal function. The left ventricle has no regional wall motion abnormalities. The left ventricular internal cavity size was normal in size. There is no left ventricular hypertrophy. Left ventricular diastolic parameters are consistent with Grade I diastolic dysfunction (impaired relaxation). Normal left ventricular filling pressure. Right Ventricle: The right ventricular size is normal. No increase in right ventricular wall thickness. Right ventricular systolic function is normal. Tricuspid regurgitation signal is inadequate for assessing PA pressure. Left Atrium: Left atrial size was normal in size. Right Atrium: Right atrial size was normal in size. Pericardium: There is no evidence of pericardial effusion. Mitral Valve: The mitral valve is degenerative in appearance. There is mild calcification of the anterior and posterior mitral valve leaflet(s). Trivial mitral valve regurgitation. No evidence of mitral valve stenosis. Tricuspid Valve: The tricuspid valve is normal in structure. Tricuspid valve regurgitation is trivial. No evidence of tricuspid stenosis. Aortic Valve: The aortic valve is normal in structure. Aortic valve regurgitation is not  visualized. No aortic stenosis is present. Pulmonic Valve: The pulmonic valve was normal in structure. Pulmonic valve regurgitation is not visualized. No evidence of pulmonic stenosis. Aorta: The aortic root is normal in size and structure. Venous: The inferior vena cava is normal in size with greater than 50% respiratory variability, suggesting right atrial pressure of 3 mmHg. IAS/Shunts: No atrial level shunt detected by color flow Doppler.  LEFT VENTRICLE PLAX 2D LVIDd:         4.00 cm   Diastology LVIDs:         2.90 cm   LV e' medial:    7.07 cm/s LV PW:         1.10 cm   LV E/e' medial:  12.7 LV IVS:        1.10 cm   LV e' lateral:   6.96 cm/s LVOT diam:  1.90 cm   LV E/e' lateral: 12.9 LVOT Area:     2.84 cm  RIGHT VENTRICLE TAPSE (M-mode): 2.5 cm LEFT ATRIUM             Index        RIGHT ATRIUM           Index LA Vol (A2C):   30.4 ml 16.25 ml/m  RA Area:     15.50 cm LA Vol (A4C):   35.7 ml 19.09 ml/m  RA Volume:   39.50 ml  21.12 ml/m LA Biplane Vol: 33.2 ml 17.75 ml/m   AORTA Ao Root diam: 2.65 cm Ao Asc diam:  3.05 cm MITRAL VALVE MV Area (PHT): 3.37 cm     SHUNTS MV Decel Time: 225 msec     Systemic Diam: 1.90 cm MV E velocity: 89.78 cm/s MV A velocity: 103.00 cm/s MV E/A ratio:  0.87 Fransico Him MD Electronically signed by Fransico Him MD Signature Date/Time: 05/03/2022/12:50:53 PM    Final (Updated)    DG Chest 2 View  Result Date: 05/02/2022 CLINICAL DATA:  Chest pain EXAM: CHEST - 2 VIEW COMPARISON:  chest x-ray dated February 19, 2019 FINDINGS: The heart size and mediastinal contours are within normal limits. Both lungs are clear. The visualized skeletal structures are unremarkable. IMPRESSION: No active cardiopulmonary disease. Electronically Signed   By: Yetta Glassman M.D.   On: 05/02/2022 16:44   MM Breast Surgical Specimen  Result Date: 04/20/2022 CLINICAL DATA:  Assess surgical specimen following radioactive seed localization of a left breast lesion. EXAM: SPECIMEN RADIOGRAPH  OF THE LEFT BREAST COMPARISON:  None Available. FINDINGS: Status post excision of the left breast. The radioactive seed and biopsy marker clip are present, completely intact, and were marked for pathology. IMPRESSION: Specimen radiograph of the left breast. Electronically Signed   By: Lajean Manes M.D.   On: 04/20/2022 08:59  MM LT RADIOACTIVE SEED LOC MAMMO GUIDE  Result Date: 04/19/2022 CLINICAL DATA:  Radioactive seed localization prior to surgery. EXAM: MAMMOGRAPHIC GUIDED RADIOACTIVE SEED LOCALIZATION OF THE LEFT BREAST COMPARISON:  None Available. FINDINGS: Patient presents for radioactive seed localization prior to surgical excision. I met with the patient and we discussed the procedure of seed localization including benefits and alternatives. We discussed the high likelihood of a successful procedure. We discussed the risks of the procedure including infection, bleeding, tissue injury and further surgery. We discussed the low dose of radioactivity involved in the procedure. Informed, written consent was given. The usual time-out protocol was performed immediately prior to the procedure. Using mammographic guidance, sterile technique, 1% lidocaine and an I-125 radioactive seed, the mass and ribbon shaped biopsy marker clip was localized using a lateral to medial approach. The follow-up mammogram images confirm the seed in the expected location and were marked for Dr. Marlou Starks. Follow-up survey of the patient confirms presence of the radioactive seed. Order number of I-125 seed:  [id number]. Total activity:  6.659 millicuries reference Date: 03/13/2022 The patient tolerated the procedure well and was released from the [facility]. She was given instructions regarding seed removal. IMPRESSION: Radioactive seed localization left breast. No apparent complications. Electronically Signed   By: Lillia Mountain M.D.   On: 04/19/2022 14:26   EKG: Normal see HPI   ASSESSMENT AND PLAN:   Chest Pain:  atypical post  recent left breast lumpectomy Resolved Normal ECG, low normal EF 50-55% no RWMA on TTE. Essentially negative troponin Ok to d/c home will arrange outpatient lexiscan myovue and  f/u with Dr Ross/Camnitz.  She has had left TKR and right knee has arthritis so will need pharmacologic testing PAF:  maintaining NSR CHADVAS 2 on eliquis and lorpessor  HLD:  continue statin  Breast Cancer:  f/u oncology and Dr Marlou Starks she seems confused about results and prognosis   Signed: Jenkins Rouge 05/04/2022, 11:16 AM

## 2022-05-04 NOTE — Plan of Care (Signed)

## 2022-05-05 ENCOUNTER — Telehealth: Payer: Self-pay | Admitting: Physician Assistant

## 2022-05-05 NOTE — Telephone Encounter (Signed)
**Note De-Identified  Obfuscation** Transition Care Management Unsuccessful Follow-up Telephone Call  Date of discharge and from where:  05/04/2022 from [facility].  Attempts:  1st Attempt  Reason for unsuccessful TCM follow-up call:  Unable to leave message because the pt answered her phone and stated that she was driving and requested that I call her back on Monday as she is aware that this is not a urgent call but a TCM call concerning her hospital discharge on yesterday.  She did state that she is currently doing ok and that she does have [place] phone number and will call us if she has questions or concerns before I call her back on Monday.  She thanked me for my call.

## 2022-05-05 NOTE — Telephone Encounter (Signed)
TOC scheduled for 05/12/22 at 3:30pm with Nicholes Rough, PA per Dr. Johnsie Cancel

## 2022-05-06 NOTE — Discharge Summary (Signed)
Physician Discharge Summary   Patient: Jennifer Foley MRN: [id number] DOB: 01-Oct-1956  Admit date:     05/02/2022  Discharge date: 05/04/2022  Discharge Physician: Hosie Poisson   PCP: Bartholome Bill, MD   Recommendations at discharge:  Follow up with PCP in one week.   Discharge Diagnoses: Principal Problem:   Chest pain Active Problems:   A-fib (HCC)   CKD (chronic kidney disease) stage 3, GFR 30-59 ml/min (HCC)   HTN (hypertension)   HLD (hyperlipidemia)    Hospital Course:  Jennifer Foley is a 66 y.o. female with medical history significant for type 2 diabetes, essential hypertension, hyperlipidemia, hypothyroidism, paroxysmal A-fib on Eliquis, CKD 3B, left breast mass status post left breast lumpectomy (04/20/22) with radioactive seed localization.  Her home Eliquis was held for 3 days in anticipation for her lumpectomy, who presented to [facility] ED with complaints of sudden onset chest pain, centrally located, while driving. EMS was activated.  She received a full dose aspirin 324 mg x 1 by EMS.  Shortly after EMS arrival, her chest pain and shortness of breath resolved. EDP requested admission for chest pain rule out ACS.  The patient was admitted by the hospitalist service, [facility]. Cardiology consulted and recommended outpatient follow up for myoview stress test.   Assessment and Plan: Atypical Chest pain rule out ACS Presented with sudden onset chest pain while she was driving, associated with shortness of breath.  Has been present for weeks and intermittent.  Lasting seconds to minutes.  No aggravating factors. In the ED, high-sensitivity troponin up-trended, 0.7 to 21 No evidence of acute ischemia on twelve-lead EKG. echocardiogram reviewed with the patient. Cardiology consutled and recommendations given. Outpatient follow up with a stress test.     Hyperlipidemia Continue with crestor.    Paroxysmal A-fib on Eliquis Resume home Eliquis for CVA prevention Resume home  Toprol-XL for rate control   Hypothyroidism Resume home levothyroxine.   CKD 3B Currently at her baseline renal function Avoid nephrotoxic agents and hypotension.   Prediabetes A1c s 5.9%       Consultants: cardiology.  Procedures performed: Echocardiogram.   Disposition: Home Diet recommendation:  Discharge Diet Orders (From admission, onward)     Start     Ordered   05/04/22 0000  Diet - low sodium heart healthy        05/04/22 1312           Cardiac diet DISCHARGE MEDICATION: Allergies as of 05/04/2022       Reactions   Contrast Media [iodinated Contrast Media] Other (See Comments)   Unknown   Doxycycline Other (See Comments)   Unknown   Restasis [cyclosporine]    "Blurred vision"   Albuterol Rash   Pt states she had a rash [facility]   Albuterol Sulfate Rash   Amoxicillin Rash   Codeine Rash   Erythromycin Base Diarrhea, Rash        Medication List     STOP taking these medications    insulin glargine 100 UNIT/ML injection Commonly known as: LANTUS   insulin lispro 100 UNIT/ML injection Commonly known as: HUMALOG   lidocaine 5 % Commonly known as: Lidoderm       TAKE these medications    acetaminophen 500 MG tablet Commonly known as: TYLENOL Take 500 mg by mouth every 6 (six) hours as needed.   Adalimumab 40 MG/0.8ML Pnkt Inject 40 mg into the skin every 14 (fourteen) days.   budesonide-formoterol 160-4.5 MCG/ACT inhaler Commonly  known as: SYMBICORT Inhale 2 puffs into the lungs daily as needed (shortness of breath).   buPROPion 150 MG 24 hr tablet Commonly known as: WELLBUTRIN XL Take 150 mg by mouth daily.   busPIRone 15 MG tablet Commonly known as: BUSPAR Take 15 mg by mouth 2 (two) times daily.   Eliquis 2.5 MG Tabs tablet Generic drug: apixaban Take 2 tablets (5 mg total) by mouth 2 (two) times daily. What changed: how much to take   fluticasone 50 MCG/ACT nasal spray Commonly known as: FLONASE Place 2  sprays into both nostrils daily as needed for allergies.   furosemide 40 MG tablet Commonly known as: LASIX Take 40 mg by mouth daily.   gabapentin 300 MG capsule Commonly known as: NEURONTIN Take 300-600 mg by mouth at bedtime as needed.   ipratropium 0.03 % nasal spray Commonly known as: ATROVENT Place 1 spray into the nose daily as needed (rhinitis).   levalbuterol 45 MCG/ACT inhaler Commonly known as: XOPENEX HFA Inhale 2 puffs into the lungs every 4 (four) hours as needed for wheezing or shortness of breath.   levothyroxine 50 MCG tablet Commonly known as: SYNTHROID Take 50 mcg by mouth daily before breakfast.   metoprolol succinate 25 MG 24 hr tablet Commonly known as: TOPROL-XL Take 25 mg by mouth daily.   mycophenolate 250 MG capsule Commonly known as: CELLCEPT Take by mouth 2 (two) times daily.   omeprazole 20 MG capsule Commonly known as: PRILOSEC Take 20 mg by mouth daily.   potassium chloride 10 MEQ tablet Commonly known as: KLOR-CON Take 10 mEq by mouth daily.   rosuvastatin 5 MG tablet Commonly known as: CRESTOR Take 5 mg by mouth daily.   tiZANidine 2 MG tablet Commonly known as: ZANAFLEX Take by mouth every 6 (six) hours as needed for muscle spasms.   traMADol 50 MG tablet Commonly known as: Ultram Take 1 tablet (50 mg total) by mouth every 6 (six) hours as needed. What changed: reasons to take this   Xiidra 5 % Soln Generic drug: Lifitegrast Apply 1 drop to eye 1 day or 1 dose.        Discharge Exam: Filed Weights   05/02/22 1552  Weight: 91.2 kg   General exam: Appears calm and comfortable  Respiratory system: Clear to auscultation. Respiratory effort normal. Cardiovascular system: S1 & S2 heard, RRR. No JVD, murmurs, rubs, gallops or clicks. No pedal edema. Gastrointestinal system: Abdomen is nondistended, soft and nontender. No organomegaly or masses felt. Normal bowel sounds heard. Central nervous system: Alert and oriented. No  focal neurological deficits. Extremities: Symmetric 5 x 5 power. Skin: No rashes, lesions or ulcers Psychiatry: Judgement and insight appear normal. Mood & affect appropriate.    Condition at discharge: fair  The results of significant diagnostics from this hospitalization (including imaging, microbiology, ancillary and laboratory) are listed below for reference.   Imaging Studies: ECHOCARDIOGRAM COMPLETE  Result Date: 05/03/2022    ECHOCARDIOGRAM REPORT   Patient Name:   Jennifer Foley Date of Exam: 05/03/2022 Medical Rec #:  [id number]       Height:       60.0 in Accession #:    [id number]      Weight:       201.0 lb Date of Birth:  07-May-1956       BSA:          1.871 m Patient Age:    61 years        BP:  122/77 mmHg Patient Gender: F               HR:           65 bpm. Exam Location:  Inpatient Procedure: 2D Echo, Cardiac Doppler and Color Doppler Indications:     Chest Pain R07.9  History:         Patient has no prior history of Echocardiogram examinations.                  CHF, Arrythmias:Atrial Fibrillation; Risk Factors:Hypertension,                  Dyslipidemia and Sleep Apnea.  Sonographer:     Ronny Flurry Sonographer#2:   Meagan Baucom RDCS, FE, PE Referring Phys:  Kayleen Memos Diagnosing Phys: Fransico Him MD IMPRESSIONS  1. Left ventricular ejection fraction, by estimation, is 50 to 55%. The left ventricle has low normal function. The left ventricle has no regional wall motion abnormalities. Left ventricular diastolic parameters are consistent with Grade I diastolic dysfunction (impaired relaxation).  2. Right ventricular systolic function is normal. The right ventricular size is normal. Tricuspid regurgitation signal is inadequate for assessing PA pressure.  3. The mitral valve is degenerative. Trivial mitral valve regurgitation. No evidence of mitral stenosis.  4. The aortic valve is normal in structure. Aortic valve regurgitation is not visualized. No aortic stenosis is  present.  5. The inferior vena cava is normal in size with greater than 50% respiratory variability, suggesting right atrial pressure of 3 mmHg. FINDINGS  Left Ventricle: Left ventricular ejection fraction, by estimation, is 50 to 55%. The left ventricle has low normal function. The left ventricle has no regional wall motion abnormalities. The left ventricular internal cavity size was normal in size. There is no left ventricular hypertrophy. Left ventricular diastolic parameters are consistent with Grade I diastolic dysfunction (impaired relaxation). Normal left ventricular filling pressure. Right Ventricle: The right ventricular size is normal. No increase in right ventricular wall thickness. Right ventricular systolic function is normal. Tricuspid regurgitation signal is inadequate for assessing PA pressure. Left Atrium: Left atrial size was normal in size. Right Atrium: Right atrial size was normal in size. Pericardium: There is no evidence of pericardial effusion. Mitral Valve: The mitral valve is degenerative in appearance. There is mild calcification of the anterior and posterior mitral valve leaflet(s). Trivial mitral valve regurgitation. No evidence of mitral valve stenosis. Tricuspid Valve: The tricuspid valve is normal in structure. Tricuspid valve regurgitation is trivial. No evidence of tricuspid stenosis. Aortic Valve: The aortic valve is normal in structure. Aortic valve regurgitation is not visualized. No aortic stenosis is present. Pulmonic Valve: The pulmonic valve was normal in structure. Pulmonic valve regurgitation is not visualized. No evidence of pulmonic stenosis. Aorta: The aortic root is normal in size and structure. Venous: The inferior vena cava is normal in size with greater than 50% respiratory variability, suggesting right atrial pressure of 3 mmHg. IAS/Shunts: No atrial level shunt detected by color flow Doppler.  LEFT VENTRICLE PLAX 2D LVIDd:         4.00 cm   Diastology LVIDs:          2.90 cm   LV e' medial:    7.07 cm/s LV PW:         1.10 cm   LV E/e' medial:  12.7 LV IVS:        1.10 cm   LV e' lateral:   6.96 cm/s LVOT diam:  1.90 cm   LV E/e' lateral: 12.9 LVOT Area:     2.84 cm  RIGHT VENTRICLE TAPSE (M-mode): 2.5 cm LEFT ATRIUM             Index        RIGHT ATRIUM           Index LA Vol (A2C):   30.4 ml 16.25 ml/m  RA Area:     15.50 cm LA Vol (A4C):   35.7 ml 19.09 ml/m  RA Volume:   39.50 ml  21.12 ml/m LA Biplane Vol: 33.2 ml 17.75 ml/m   AORTA Ao Root diam: 2.65 cm Ao Asc diam:  3.05 cm MITRAL VALVE MV Area (PHT): 3.37 cm     SHUNTS MV Decel Time: 225 msec     Systemic Diam: 1.90 cm MV E velocity: 89.78 cm/s MV A velocity: 103.00 cm/s MV E/A ratio:  0.87 Fransico Him MD Electronically signed by Fransico Him MD Signature Date/Time: 05/03/2022/12:50:53 PM    Final (Updated)    DG Chest 2 View  Result Date: 05/02/2022 CLINICAL DATA:  Chest pain EXAM: CHEST - 2 VIEW COMPARISON:  chest x-ray dated February 19, 2019 FINDINGS: The heart size and mediastinal contours are within normal limits. Both lungs are clear. The visualized skeletal structures are unremarkable. IMPRESSION: No active cardiopulmonary disease. Electronically Signed   By: Yetta Glassman M.D.   On: 05/02/2022 16:44   MM Breast Surgical Specimen  Result Date: 04/20/2022 CLINICAL DATA:  Assess surgical specimen following radioactive seed localization of a left breast lesion. EXAM: SPECIMEN RADIOGRAPH OF THE LEFT BREAST COMPARISON:  None Available. FINDINGS: Status post excision of the left breast. The radioactive seed and biopsy marker clip are present, completely intact, and were marked for pathology. IMPRESSION: Specimen radiograph of the left breast. Electronically Signed   By: Lajean Manes M.D.   On: 04/20/2022 08:59  MM LT RADIOACTIVE SEED LOC MAMMO GUIDE  Result Date: 04/19/2022 CLINICAL DATA:  Radioactive seed localization prior to surgery. EXAM: MAMMOGRAPHIC GUIDED RADIOACTIVE SEED LOCALIZATION OF  THE LEFT BREAST COMPARISON:  None Available. FINDINGS: Patient presents for radioactive seed localization prior to surgical excision. I met with the patient and we discussed the procedure of seed localization including benefits and alternatives. We discussed the high likelihood of a successful procedure. We discussed the risks of the procedure including infection, bleeding, tissue injury and further surgery. We discussed the low dose of radioactivity involved in the procedure. Informed, written consent was given. The usual time-out protocol was performed immediately prior to the procedure. Using mammographic guidance, sterile technique, 1% lidocaine and an I-125 radioactive seed, the mass and ribbon shaped biopsy marker clip was localized using a lateral to medial approach. The follow-up mammogram images confirm the seed in the expected location and were marked for Dr. Marlou Starks. Follow-up survey of the patient confirms presence of the radioactive seed. Order number of I-125 seed:  [id number]. Total activity:  1.275 millicuries reference Date: 03/13/2022 The patient tolerated the procedure well and was released from the [facility]. She was given instructions regarding seed removal. IMPRESSION: Radioactive seed localization left breast. No apparent complications. Electronically Signed   By: Lillia Mountain M.D.   On: 04/19/2022 14:26   Microbiology: Results for orders placed or performed during the hospital encounter of 07/13/20  SARS CORONAVIRUS 2 (TAT 6-24 HRS) Nasopharyngeal Nasopharyngeal Swab     Status: None   Collection Time: 07/13/20  2:58 PM   Specimen: Nasopharyngeal Swab  Result Value  Ref Range Status   SARS Coronavirus 2 NEGATIVE NEGATIVE Final    Comment: (NOTE) SARS-CoV-2 target nucleic acids are NOT DETECTED.  The SARS-CoV-2 RNA is generally detectable in upper and lower respiratory specimens during the acute phase of infection. Negative results do not preclude SARS-CoV-2 infection, do not rule  out co-infections with other pathogens, and should not be used as the sole basis for treatment or other patient management decisions. Negative results must be combined with clinical observations, patient history, and epidemiological information. The expected result is Negative.  Fact Sheet for Patients: SugarRoll.be  Fact Sheet for Healthcare Providers: https://www.woods-mathews.com/  This test is not yet approved or cleared by the [place] FDA and  has been authorized for detection and/or diagnosis of SARS-CoV-2 by FDA under an Emergency Use Authorization (EUA). This EUA will remain  in effect (meaning this test can be used) for the duration of the COVID-19 declaration under Se ction 564(b)(1) of the Act, 21 U.S.C. section 360bbb-3(b)(1), unless the authorization is terminated or revoked sooner.  Performed at [facility], [place] [address] [place], [place] [address]     Labs: CBC: Recent Labs  Lab 05/02/22 1609 05/03/22 0523 05/04/22 0213  WBC 6.8 7.2 7.0  HGB 12.5 11.3* 11.0*  HCT 40.7 35.4* 34.6*  MCV 94.9 92.4 91.5  PLT 220 199 961   Basic Metabolic Panel: Recent Labs  Lab 05/02/22 1609 05/03/22 0523 05/04/22 0213  NA 138 142 140  K 3.6 3.9 4.2  CL 103 105 110  CO2 _0 GLUCOSE 120* 105* 97  BUN _1 CREATININE 1.63* 1.56* 1.48*  CALCIUM 8.9 9.0 8.8*  MG  --  1.9  --   PHOS  --  3.2  --    Liver Function Tests: No results for input(s): "AST", "ALT", "ALKPHOS", "BILITOT", "PROT", "ALBUMIN" in the last 168 hours. CBG: Recent Labs  Lab 05/02/22 2122 05/04/22 0810  GLUCAP 87 92    Discharge time spent: 46 minutes.   Signed: Hosie Poisson, MD [facility] 05/06/2022

## 2022-05-08 MED ORDER — APIXABAN 5 MG PO TABS: 5.0000 mg | ORAL_TABLET | Freq: Two times a day (BID) | ORAL | 2 refills | Status: AC

## 2022-05-08 NOTE — Telephone Encounter (Signed)
**Note De-Identified  Obfuscation** Patient contacted regarding discharge from [facility] on 05/02/2022.  Patient understands to follow up with provider Nicholes Rough, PA-c on 05/12/2022 at 3:30 at [address], [place] in [place], [place] [address]. Patient understands discharge instructions? Yes  Patient understands medications and regiment? Yes. She states that her pharmacy will not fill her Eliquis because there was a does increase while she was in the hospital. Med list reviewed and looks like Eliquis was sent to [facility] to fill as 2.5 mg-take 2 tablets (5 mg) BID for #60. I changed it to Eliquis 5 mg -take 1 tablet BID #60  and e-scribed it to [facility] to fill. The pt is aware to call us back at [phone] if she has any issues picking up her Eliquis refill.  Patient understands to bring all medications to this visit? Yes  Ask patient:  Are you enrolled in My Chart: Yes  The pt reports that she continues to have occasional CP and that she is aware that she has a Hiatal hernia and feels it is the reason for her CP. I did advise her to contact her PCP to discuss her hernia and treatment options a she states that she was advised while in the hospital that her CP was not heart related. She is currently taking Omeprazole 20 mg daily.  She denies having any SOB like she did prior to this hospitalization since she has returned home from the hospital and states that she is aware that her anxiety over her CP lead to her SOB.  She denies having CP/discomfort, SOB, nausea, vomiting, diaphoresis, dizziness , or lightheadedness.  She does have [facility]'s phone number and states that she will call us if she has any questions or concerns. She thanked me for my call.

## 2022-05-09 ENCOUNTER — Ambulatory Visit (INDEPENDENT_AMBULATORY_CARE_PROVIDER_SITE_OTHER): Payer: Medicare Other | Admitting: Orthopaedic Surgery

## 2022-05-09 ENCOUNTER — Ambulatory Visit: Payer: Self-pay

## 2022-05-09 ENCOUNTER — Encounter: Payer: Self-pay | Admitting: Orthopaedic Surgery

## 2022-05-09 VITALS — BP 155/66 | HR 76 | Ht 61.0 in | Wt 197.0 lb

## 2022-05-09 DIAGNOSIS — M7541 Impingement syndrome of right shoulder: Secondary | ICD-10-CM | POA: Diagnosis not present

## 2022-05-09 DIAGNOSIS — M25561 Pain in right knee: Secondary | ICD-10-CM | POA: Diagnosis not present

## 2022-05-10 DIAGNOSIS — M7541 Impingement syndrome of right shoulder: Secondary | ICD-10-CM

## 2022-05-10 MED ORDER — BUPIVACAINE HCL 0.25 % IJ SOLN
4.0000 mL | INTRAMUSCULAR | Status: AC | PRN
  Administered 2022-05-10: 4 mL via INTRA_ARTICULAR

## 2022-05-10 MED ORDER — LIDOCAINE HCL 1 % IJ SOLN
0.5000 mL | INTRAMUSCULAR | Status: AC | PRN
  Administered 2022-05-10: .5 mL

## 2022-05-10 MED ORDER — METHYLPREDNISOLONE ACETATE 40 MG/ML IJ SUSP
40.0000 mg | INTRAMUSCULAR | Status: AC | PRN
  Administered 2022-05-10: 40 mg via INTRA_ARTICULAR

## 2022-05-10 NOTE — Progress Notes (Signed)
Office Visit Note   Patient: Jennifer Foley           Date of Birth: [birth year]           MRN: [id number] Visit Date: 05/09/2022              Requested by: Bartholome Bill, [place],  [place] [address] PCP: Bartholome Bill, MD   Assessment & Plan: Visit Diagnoses:  1. Acute pain of right knee   2. Impingement syndrome of right shoulder     Plan: Last A1c was 5.9 subacromial injection performed in her shoulder.  If she has ongoing problems we will need to proceed with an MRI scan of her shoulder to rule out partial rotator cuff tear.  No change in her medications.  Follow-Up Instructions: No follow-ups on file.   Orders:  Orders Placed This Encounter  Procedures   XR KNEE 3 VIEW RIGHT   No orders of the defined types were placed in this encounter.     Procedures: Large Joint Inj: R subacromial bursa on 05/10/2022 7:45 AM Indications: pain Details: 22 G 1.5 in needle  Arthrogram: No  Medications: 4 mL bupivacaine 0.25 %; 40 mg methylPREDNISolone acetate 40 MG/ML; 0.5 mL lidocaine 1 % Outcome: tolerated well, no immediate complications Procedure, treatment alternatives, risks and benefits explained, specific risks discussed. Consent was given by the patient. Immediately prior to procedure a time out was called to verify the correct patient, procedure, equipment, support staff and site/side marked as required. Patient was prepped and draped in the usual sterile fashion.       Clinical Data: No additional findings.   Subjective: Chief Complaint  Patient presents with   Right Shoulder - Follow-up, Pain   Right Knee - Pain    HPI 66 year old female follow-up left total knee arthroplasty with arthrofibrosis surgery date 04/21/2020.  Patient's had the recurrence of shoulder problems had previous injection in March and right shoulder done well until the last several weeks and she is requesting a repeat injection.  Opposite right  knee is also giving her progressive problems.  With the problems she had keeping her range of motion on the left knee she is hesitant to consider total knee arthroplasty on the right at this time and wants to wait.  She has had problems with atrial fib and takes Eliquis.  Right arm is bothering her with outstretch overhead activities.  No numbness or tingling in her hands.  Review of Systems all other systems updated unchanged.   Objective: Vital Signs: BP (!) 155/66   Pulse 76   Ht '5\' 1"'$  (1.549 m)   Wt 197 lb (89.4 kg)   BMI 37.22 kg/m   Physical Exam Constitutional:      Appearance: She is well-developed.  HENT:     Head: Normocephalic.     Right Ear: External ear normal.     Left Ear: External ear normal. There is no impacted cerumen.  Eyes:     Pupils: Pupils are equal, round, and reactive to light.  Neck:     Thyroid: No thyromegaly.     Trachea: No tracheal deviation.  Cardiovascular:     Rate and Rhythm: Normal rate.  Pulmonary:     Effort: Pulmonary effort is normal.  Abdominal:     Palpations: Abdomen is soft.  Musculoskeletal:     Cervical back: No rigidity.  Skin:    General: Skin is warm and dry.  Neurological:     Mental Status: She is alert and oriented to person, place, and time.  Psychiatric:        Behavior: Behavior normal.     Ortho Exam positive impingement right shoulder.  No brachial plexus tenderness right or left.  Good cervical range of motion.  Upper extremity reflexes are 2+ negative drop arm test Long head of the biceps minimally tender.  Specialty Comments:  No specialty comments available.  Imaging: No results found.   PMFS History: Patient Active Problem List   Diagnosis Date Noted   Chest pain 05/03/2022   Impingement syndrome of right shoulder 03/29/2021   Arthrofibrosis of knee joint, left 06/24/2020   S/P total knee arthroplasty, left 06/02/2020   Noncompliance with CPAP treatment 09/24/2018   A-fib ([place]) 08/31/2018   CHF  (congestive heart failure) ([place]) 08/31/2018   Acute pyelonephritis 08/31/2018   Sepsis secondary to UTI ([facility]) 08/31/2018   Leucocytosis 08/31/2018   Hypothyroidism 08/31/2018   CKD (chronic kidney disease) stage 3, GFR 30-59 ml/min ([facility]) 08/31/2018   HTN (hypertension) 08/31/2018   HLD (hyperlipidemia) 08/31/2018   Anxiety and depression 08/31/2018   Obesity 08/31/2018   Sepsis ([facility]) 08/31/2018   Back pain 08/31/2018   Obstructive sleep apnea treated with continuous positive airway pressure (CPAP) 08/13/2018   Past Medical History:  Diagnosis Date   Anemia    Arthritis    per patient, in left and right knee and both hands   Atrial fibrillation (HCC)    Bilateral breast cysts    CHF (congestive heart failure) ([place])    Chronic kidney disease    stage 3   Depression with anxiety    DM (diabetes mellitus) (HCC)    Esophageal reflux    History of hiatal hernia    HTN (hypertension)    Hyperlipemia    Hyperthyroidism    Hypothyroidism    INR (international normal ratio) abnormal    MRSA (methicillin resistant staph aureus) culture positive    Panuveitis    Pneumonia    PTSD (post-traumatic stress disorder)    Sleep apnea    per patient has CPAP, wears it at night sometimes    Family History  Problem Relation Age of Onset   Alcoholism Father    Arthritis Father    Hypertension Father        sister, mother   Diabetes Mellitus I Mother        sister   Renal Disease Mother    Stroke Mother    Epilepsy Brother     Past Surgical History:  Procedure Laterality Date   ABDOMINAL HYSTERECTOMY     BREAST BIOPSY     BREAST LUMPECTOMY WITH RADIOACTIVE SEED LOCALIZATION Left 04/20/2022   Procedure: LEFT BREAST LUMPECTOMY WITH RADIOACTIVE SEED LOCALIZATION;  Surgeon: Jovita Kussmaul, MD;  Location: [facility];  Service: General;  Laterality: Left;   CATARACT EXTRACTION  2018   CESAREAN SECTION     DENTAL SURGERY     EYE SURGERY     FOOT SURGERY     HERNIA REPAIR      KNEE ARTHROSCOPY Left 07/16/2020   Procedure: ARTHROSCOPY KNEE;  Surgeon: [name], MD;  Location: [facility];  Service: Orthopedics;  Laterality: Left;   KNEE CLOSED REDUCTION Left 07/16/2020   Procedure: LEFT KNEE MANIPULATION UNDER ANESTHESIA;  Surgeon: [name], MD;  Location: [facility];  Service: Orthopedics;  Laterality: Left;   KNEE SURGERY     TONSILLECTOMY  AND ADENOIDECTOMY     TOTAL KNEE ARTHROPLASTY Left 04/21/2020   Procedure: LEFT TOTAL KNEE ARTHROPLASTY;  Surgeon: [name], MD;  Location: [facility];  Service: Orthopedics;  Laterality: Left;   Social History   Occupational History   Not on file  Tobacco Use   Smoking status: Former    Packs/day: 0.25    Years: 34.00    Total pack years: 8.50    Types: Cigarettes    Quit date: 12/15/2003    Years since quitting: 18.4   Smokeless tobacco: Current    Types: Snuff    Last attempt to quit: 03/2020   Tobacco comments:    04/15/2020: per patient 1-2 times a day, trying to stop taking it  Vaping Use   Vaping Use: Never used  Substance and Sexual Activity   Alcohol use: Not Currently   Drug use: Never   Sexual activity: Not on file

## 2022-05-11 ENCOUNTER — Telehealth: Payer: Self-pay | Admitting: Physician Assistant

## 2022-05-11 NOTE — Telephone Encounter (Signed)
05/11/22 LVM to r/s appt on 05/12/22 with Nicholes Rough, PA, r/s to next available APP - [facility]

## 2022-05-11 NOTE — Telephone Encounter (Signed)
05/11/22 called patient back and LVM to let her know that her 3:30pm appt was cancelled but she is r/s for 3:45pm on the same day. I told her to disregard my last VM, you can send to me if she calls back - LCN

## 2022-05-11 NOTE — Progress Notes (Deleted)
Office Visit    Patient Name: Jennifer Foley Date of Encounter: 05/11/2022  PCP:  Bartholome Bill, [place]  Cardiologist:  Berniece Salines, DO  Advanced Practice Provider:  No care team member to display Electrophysiologist:  None    Chief Complaint    Jennifer Foley is a 66 y.o. female with a hx of atypical chest pain 05/03/2022 no history of CAD, PAF (on Eliquis and Lopressor), recent left breast lumpectomy by Dr. Marlou Starks on 04/20/2022, hypertension, CKD stage III, CHF, hypothyroidism, sleep apnea (wears CPAP sometimes at night) presents today for hospital follow-up.  She was seen in the hospital by Dr. Johnsie Cancel.  She had sharp pain which was nonexertional and some dyspnea occurring while driving.  This resolved in the hospital.  EKG was normal with no acute changes.  Troponin essentially negative low and no trend TTE with normal EF 50 to 55% no RWMA.  No pain in the hospital.  She was on statin for HLD.  Plan was for outpatient Lexiscan Myoview.  Today, she ***    Past Medical History    Past Medical History:  Diagnosis Date   Anemia    Arthritis    per patient, in left and right knee and both hands   Atrial fibrillation (HCC)    Bilateral breast cysts    CHF (congestive heart failure) (HCC)    Chronic kidney disease    stage 3   Depression with anxiety    DM (diabetes mellitus) (HCC)    Esophageal reflux    History of hiatal hernia    HTN (hypertension)    Hyperlipemia    Hyperthyroidism    Hypothyroidism    INR (international normal ratio) abnormal    MRSA (methicillin resistant staph aureus) culture positive    Panuveitis    Pneumonia    PTSD (post-traumatic stress disorder)    Sleep apnea    per patient has CPAP, wears it at night sometimes   Past Surgical History:  Procedure Laterality Date   ABDOMINAL HYSTERECTOMY     BREAST BIOPSY     BREAST LUMPECTOMY WITH RADIOACTIVE SEED LOCALIZATION Left 04/20/2022   Procedure: LEFT  BREAST LUMPECTOMY WITH RADIOACTIVE SEED LOCALIZATION;  Surgeon: Jovita Kussmaul, MD;  Location: [place];  Service: General;  Laterality: Left;   CATARACT EXTRACTION  2018   CESAREAN SECTION     DENTAL SURGERY     EYE SURGERY     FOOT SURGERY     HERNIA REPAIR     KNEE ARTHROSCOPY Left 07/16/2020   Procedure: ARTHROSCOPY KNEE;  Surgeon: [name], MD;  Location: [place];  Service: Orthopedics;  Laterality: Left;   KNEE CLOSED REDUCTION Left 07/16/2020   Procedure: LEFT KNEE MANIPULATION UNDER ANESTHESIA;  Surgeon: [name], MD;  Location: [place];  Service: Orthopedics;  Laterality: Left;   KNEE SURGERY     TONSILLECTOMY AND ADENOIDECTOMY     TOTAL KNEE ARTHROPLASTY Left 04/21/2020   Procedure: LEFT TOTAL KNEE ARTHROPLASTY;  Surgeon: [name], MD;  Location: [place];  Service: Orthopedics;  Laterality: Left;    Allergies  Allergies  Allergen Reactions   Contrast Media [Iodinated Contrast Media] Other (See Comments)    Unknown   Doxycycline Other (See Comments)    Unknown   Restasis [Cyclosporine]     "Blurred vision"   Albuterol Rash    Pt states she had a rash from it once at [facility]  Albuterol Sulfate Rash   Amoxicillin Rash   Codeine Rash   Erythromycin Base Diarrhea and Rash     EKGs/Labs/Other Studies Reviewed:   The following studies were reviewed today:  Echocardiogram 05/03/2022 IMPRESSIONS     1. Left ventricular ejection fraction, by estimation, is 50 to 55%. The  left ventricle has low normal function. The left ventricle has no regional  wall motion abnormalities. Left ventricular diastolic parameters are  consistent with Grade I diastolic  dysfunction (impaired relaxation).   2. Right ventricular systolic function is normal. The right ventricular  size is normal. Tricuspid regurgitation signal is inadequate for assessing  PA pressure.   3. The mitral valve is degenerative. Trivial mitral valve regurgitation.  No evidence of mitral  stenosis.   4. The aortic valve is normal in structure. Aortic valve regurgitation is  not visualized. No aortic stenosis is present.   5. The inferior vena cava is normal in size with greater than 50%  respiratory variability, suggesting right atrial pressure of 3 mmHg.   EKG:  EKG is *** ordered today.  The ekg ordered today demonstrates ***  Recent Labs: 05/03/2022: Magnesium 1.9 05/04/2022: BUN 17; Creatinine, Ser 1.48; Hemoglobin 11.0; Platelets 184; Potassium 4.2; Sodium 140  Recent Lipid Panel    Component Value Date/Time   CHOL 140 05/03/2022 0523   TRIG 74 05/03/2022 0523   HDL 50 05/03/2022 0523   CHOLHDL 2.8 05/03/2022 0523   VLDL 15 05/03/2022 0523   LDLCALC 75 05/03/2022 0523   Home Medications   No outpatient medications have been marked as taking for the 05/12/22 encounter (Appointment) with Elgie Collard, PA-C.   Current Facility-Administered Medications for the 05/12/22 encounter (Appointment) with Elgie Collard, PA-C  Medication   lidocaine (LIDODERM) 5 % 1 patch     Review of Systems   ***   All other systems reviewed and are otherwise negative except as noted above.  Physical Exam    VS:  There were no vitals taken for this visit. , BMI There is no height or weight on file to calculate BMI.  Wt Readings from Last 3 Encounters:  05/09/22 197 lb (89.4 kg)  05/02/22 201 lb (91.2 kg)  04/20/22 201 lb 11.5 oz (91.5 kg)     GEN: Well nourished, well developed, in no acute distress. HEENT: normal. Neck: Supple, no JVD, carotid bruits, or masses. Cardiac: ***RRR, no murmurs, rubs, or gallops. No clubbing, cyanosis, edema.  ***Radials/PT 2+ and equal bilaterally.  Respiratory:  ***Respirations regular and unlabored, clear to auscultation bilaterally. GI: Soft, nontender, nondistended. MS: No deformity or atrophy. Skin: Warm and dry, no rash. Neuro:  Strength and sensation are intact. Psych: Normal affect.  Assessment & Plan    Atypical chest  pain  PAF  Hyperlipidemia  Breast cancer     Disposition: Follow up {follow up:15908} with Godfrey Pick Tobb, DO or APP.  Signed, Elgie Collard, PA-C 05/11/2022, 9:44 PM [facility]

## 2022-05-12 ENCOUNTER — Ambulatory Visit: Payer: Medicare Other | Admitting: Physician Assistant

## 2022-05-12 ENCOUNTER — Other Ambulatory Visit: Payer: Self-pay | Admitting: *Deleted

## 2022-05-12 DIAGNOSIS — I480 Paroxysmal atrial fibrillation: Secondary | ICD-10-CM

## 2022-05-12 DIAGNOSIS — R079 Chest pain, unspecified: Secondary | ICD-10-CM

## 2022-05-12 DIAGNOSIS — R0789 Other chest pain: Secondary | ICD-10-CM

## 2022-05-12 DIAGNOSIS — E785 Hyperlipidemia, unspecified: Secondary | ICD-10-CM

## 2022-05-12 DIAGNOSIS — C50919 Malignant neoplasm of unspecified site of unspecified female breast: Secondary | ICD-10-CM

## 2022-05-18 ENCOUNTER — Other Ambulatory Visit: Payer: Self-pay | Admitting: Physician Assistant

## 2022-05-18 DIAGNOSIS — R079 Chest pain, unspecified: Secondary | ICD-10-CM

## 2022-05-19 ENCOUNTER — Telehealth (HOSPITAL_COMMUNITY): Payer: Self-pay | Admitting: *Deleted

## 2022-05-19 NOTE — Telephone Encounter (Signed)
Patient given detailed instructions per Myocardial Perfusion Study Information Sheet for the test on 05/24/2022 at 10:45. Patient notified to arrive 15 minutes early and that it is imperative to arrive on time for appointment to keep from having the test rescheduled.  If you need to cancel or reschedule your appointment, please call the office within 24 hours of your appointment. . Patient verbalized understanding.Jennifer Foley

## 2022-05-23 ENCOUNTER — Telehealth: Payer: Self-pay

## 2022-05-23 NOTE — Telephone Encounter (Signed)
-----   Message from Delynn Flavin sent at 05/19/2022 12:31 PM EDT ----- Please help find this patient an appt close her test, thanks in advance! ----- Message ----- From: Richmond Campbell, LPN Sent: 6/38/1771  11:53 AM EDT To: [facility]  PT NEEDS F/U APPT WITH DR Harrington Challenger AFTER LEXISCAN HAS BEEN DONE PER DR Ewa Gentry

## 2022-05-23 NOTE — Telephone Encounter (Signed)
Appt made with Dr Harrington Challenger 05/29/22.

## 2022-05-24 ENCOUNTER — Ambulatory Visit (HOSPITAL_COMMUNITY): Payer: Medicare Other | Attending: Physician Assistant

## 2022-05-24 DIAGNOSIS — R079 Chest pain, unspecified: Secondary | ICD-10-CM | POA: Insufficient documentation

## 2022-05-24 DIAGNOSIS — I480 Paroxysmal atrial fibrillation: Secondary | ICD-10-CM | POA: Insufficient documentation

## 2022-05-24 LAB — MYOCARDIAL PERFUSION IMAGING
LV dias vol: 76 mL (ref 46–106)
LV sys vol: 36 mL
Nuc Stress EF: 53 %
Peak HR: 105 {beats}/min
Rest HR: 69 {beats}/min
Rest Nuclear Isotope Dose: 10.6 mCi
SDS: 3
SRS: 0
SSS: 3
ST Depression (mm): 0 mm
Stress Nuclear Isotope Dose: 32.9 mCi
TID: 0.98

## 2022-05-24 MED ORDER — REGADENOSON 0.4 MG/5ML IV SOLN
0.4000 mg | Freq: Once | INTRAVENOUS | Status: AC
  Administered 2022-05-24: 0.4 mg via INTRAVENOUS

## 2022-05-24 MED ORDER — TECHNETIUM TC 99M TETROFOSMIN IV KIT
32.9000 | PACK | Freq: Once | INTRAVENOUS | Status: AC | PRN
  Administered 2022-05-24: 32.9 via INTRAVENOUS

## 2022-05-24 MED ORDER — TECHNETIUM TC 99M TETROFOSMIN IV KIT
10.6000 | PACK | Freq: Once | INTRAVENOUS | Status: AC | PRN
  Administered 2022-05-24: 10.6 via INTRAVENOUS

## 2022-05-25 ENCOUNTER — Encounter: Payer: Self-pay | Admitting: Cardiology

## 2022-05-25 NOTE — Telephone Encounter (Signed)
Error

## 2022-05-30 ENCOUNTER — Ambulatory Visit: Payer: Medicare Other | Admitting: Internal Medicine

## 2022-07-27 ENCOUNTER — Other Ambulatory Visit: Payer: Self-pay | Admitting: Physician Assistant

## 2022-08-07 ENCOUNTER — Other Ambulatory Visit: Payer: Self-pay | Admitting: Physician Assistant

## 2022-08-12 ENCOUNTER — Other Ambulatory Visit: Payer: Self-pay | Admitting: Physician Assistant

## 2023-07-21 ENCOUNTER — Other Ambulatory Visit: Payer: Self-pay | Admitting: Family Medicine

## 2023-07-21 DIAGNOSIS — E2839 Other primary ovarian failure: Secondary | ICD-10-CM

## 2024-01-24 ENCOUNTER — Ambulatory Visit (HOSPITAL_COMMUNITY): Payer: Medicare Other | Admitting: Psychiatry

## 2024-02-06 ENCOUNTER — Other Ambulatory Visit: Payer: Medicare Other
